# Patient Record
Sex: Female | Born: 1980 | Race: White | Hispanic: No | Smoking: Current every day smoker
Health system: Southern US, Community
[De-identification: ages and names within clinical notes are randomized; demographics above are authoritative.]

## PROBLEM LIST (undated history)

## (undated) DIAGNOSIS — I10 Essential (primary) hypertension: Secondary | ICD-10-CM

## (undated) DIAGNOSIS — F319 Bipolar disorder, unspecified: Secondary | ICD-10-CM

## (undated) HISTORY — PX: CHOLECYSTECTOMY: SHX55

---
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
## 2000-06-04 ENCOUNTER — Emergency Department (HOSPITAL_COMMUNITY): Admission: EM | Admit: 2000-06-04 | Discharge: 2000-06-04 | Payer: Self-pay

## 2000-06-28 ENCOUNTER — Emergency Department (HOSPITAL_COMMUNITY): Admission: EM | Admit: 2000-06-28 | Discharge: 2000-06-29 | Payer: Self-pay | Admitting: Emergency Medicine

## 2000-06-29 ENCOUNTER — Encounter: Payer: Self-pay | Admitting: Emergency Medicine

## 2000-07-10 ENCOUNTER — Encounter: Admission: RE | Admit: 2000-07-10 | Discharge: 2000-07-10 | Payer: Self-pay | Admitting: Sports Medicine

## 2000-08-01 ENCOUNTER — Encounter: Admission: RE | Admit: 2000-08-01 | Discharge: 2000-08-01 | Payer: Self-pay | Admitting: Family Medicine

## 2000-08-11 ENCOUNTER — Observation Stay (HOSPITAL_COMMUNITY): Admission: RE | Admit: 2000-08-11 | Discharge: 2000-08-12 | Payer: Self-pay

## 2000-10-23 ENCOUNTER — Emergency Department (HOSPITAL_COMMUNITY): Admission: EM | Admit: 2000-10-23 | Discharge: 2000-10-24 | Payer: Self-pay | Admitting: Emergency Medicine

## 2001-01-21 ENCOUNTER — Encounter: Admission: RE | Admit: 2001-01-21 | Discharge: 2001-01-21 | Payer: Self-pay | Admitting: Family Medicine

## 2001-04-17 ENCOUNTER — Encounter: Admission: RE | Admit: 2001-04-17 | Discharge: 2001-04-17 | Payer: Self-pay | Admitting: Family Medicine

## 2001-05-07 ENCOUNTER — Encounter: Admission: RE | Admit: 2001-05-07 | Discharge: 2001-05-07 | Payer: Self-pay | Admitting: Family Medicine

## 2002-09-25 ENCOUNTER — Emergency Department (HOSPITAL_COMMUNITY): Admission: EM | Admit: 2002-09-25 | Discharge: 2002-09-25 | Payer: Self-pay | Admitting: Emergency Medicine

## 2003-05-24 ENCOUNTER — Other Ambulatory Visit: Admission: RE | Admit: 2003-05-24 | Discharge: 2003-05-24 | Payer: Self-pay | Admitting: Family Medicine

## 2003-05-24 ENCOUNTER — Encounter: Admission: RE | Admit: 2003-05-24 | Discharge: 2003-05-24 | Payer: Self-pay | Admitting: Sports Medicine

## 2004-02-01 ENCOUNTER — Ambulatory Visit: Payer: Self-pay | Admitting: Family Medicine

## 2004-02-22 ENCOUNTER — Ambulatory Visit: Payer: Self-pay | Admitting: Sports Medicine

## 2004-03-20 ENCOUNTER — Ambulatory Visit: Payer: Self-pay | Admitting: Family Medicine

## 2004-03-26 ENCOUNTER — Ambulatory Visit: Payer: Self-pay | Admitting: Family Medicine

## 2004-04-04 ENCOUNTER — Ambulatory Visit: Payer: Self-pay | Admitting: Family Medicine

## 2004-04-17 ENCOUNTER — Ambulatory Visit: Payer: Self-pay | Admitting: Family Medicine

## 2004-05-03 ENCOUNTER — Emergency Department (HOSPITAL_COMMUNITY): Admission: EM | Admit: 2004-05-03 | Discharge: 2004-05-04 | Payer: Self-pay | Admitting: Emergency Medicine

## 2004-05-15 ENCOUNTER — Ambulatory Visit: Payer: Self-pay | Admitting: Family Medicine

## 2004-06-13 ENCOUNTER — Ambulatory Visit: Payer: Self-pay | Admitting: Sports Medicine

## 2004-11-12 ENCOUNTER — Ambulatory Visit: Payer: Self-pay | Admitting: Sports Medicine

## 2005-02-19 ENCOUNTER — Ambulatory Visit: Payer: Self-pay | Admitting: Family Medicine

## 2005-04-07 ENCOUNTER — Emergency Department (HOSPITAL_COMMUNITY): Admission: EM | Admit: 2005-04-07 | Discharge: 2005-04-07 | Payer: Self-pay | Admitting: Emergency Medicine

## 2005-10-28 ENCOUNTER — Emergency Department (HOSPITAL_COMMUNITY): Admission: EM | Admit: 2005-10-28 | Discharge: 2005-10-28 | Payer: Self-pay | Admitting: Emergency Medicine

## 2006-08-27 ENCOUNTER — Emergency Department (HOSPITAL_COMMUNITY): Admission: EM | Admit: 2006-08-27 | Discharge: 2006-08-27 | Payer: Self-pay | Admitting: Emergency Medicine

## 2007-02-28 ENCOUNTER — Emergency Department (HOSPITAL_COMMUNITY): Admission: EM | Admit: 2007-02-28 | Discharge: 2007-02-28 | Payer: Self-pay | Admitting: Emergency Medicine

## 2007-05-11 ENCOUNTER — Encounter: Payer: Self-pay | Admitting: Family Medicine

## 2007-05-11 ENCOUNTER — Ambulatory Visit: Payer: Self-pay | Admitting: Family Medicine

## 2007-05-11 DIAGNOSIS — F319 Bipolar disorder, unspecified: Secondary | ICD-10-CM

## 2007-05-11 DIAGNOSIS — N912 Amenorrhea, unspecified: Secondary | ICD-10-CM | POA: Insufficient documentation

## 2007-05-11 LAB — CONVERTED CEMR LAB: Beta hcg, urine, semiquantitative: NEGATIVE

## 2007-05-12 LAB — CONVERTED CEMR LAB
ALT: 35 units/L (ref 0–35)
AST: 22 units/L (ref 0–37)
Albumin: 4.1 g/dL (ref 3.5–5.2)
Alkaline Phosphatase: 84 units/L (ref 39–117)
BUN: 9 mg/dL (ref 6–23)
CO2: 23 meq/L (ref 19–32)
Calcium: 9.5 mg/dL (ref 8.4–10.5)
Chlamydia, DNA Probe: NEGATIVE
Chloride: 105 meq/L (ref 96–112)
Creatinine, Ser: 0.75 mg/dL (ref 0.40–1.20)
Direct LDL: 109 mg/dL — ABNORMAL HIGH
GC Probe Amp, Genital: NEGATIVE
Glucose, Bld: 94 mg/dL (ref 70–99)
Potassium: 4.1 meq/L (ref 3.5–5.3)
Sodium: 139 meq/L (ref 135–145)
Total Bilirubin: 0.8 mg/dL (ref 0.3–1.2)
Total Protein: 6.8 g/dL (ref 6.0–8.3)

## 2007-05-14 ENCOUNTER — Encounter: Payer: Self-pay | Admitting: Family Medicine

## 2007-05-14 LAB — CONVERTED CEMR LAB: Pap Smear: NORMAL

## 2007-07-29 ENCOUNTER — Emergency Department (HOSPITAL_COMMUNITY): Admission: EM | Admit: 2007-07-29 | Discharge: 2007-07-29 | Payer: Self-pay | Admitting: Emergency Medicine

## 2007-07-31 ENCOUNTER — Telehealth: Payer: Self-pay | Admitting: *Deleted

## 2009-01-14 ENCOUNTER — Encounter (INDEPENDENT_AMBULATORY_CARE_PROVIDER_SITE_OTHER): Payer: Self-pay | Admitting: *Deleted

## 2009-01-14 DIAGNOSIS — Z716 Tobacco abuse counseling: Secondary | ICD-10-CM | POA: Insufficient documentation

## 2009-05-03 ENCOUNTER — Emergency Department (HOSPITAL_COMMUNITY): Admission: EM | Admit: 2009-05-03 | Discharge: 2009-05-03 | Payer: Self-pay | Admitting: Emergency Medicine

## 2010-04-10 ENCOUNTER — Other Ambulatory Visit
Admission: RE | Admit: 2010-04-10 | Discharge: 2010-04-10 | Payer: Self-pay | Source: Home / Self Care | Admitting: Family Medicine

## 2010-04-10 ENCOUNTER — Ambulatory Visit: Payer: Self-pay | Admitting: Family Medicine

## 2010-04-10 DIAGNOSIS — L919 Hypertrophic disorder of the skin, unspecified: Secondary | ICD-10-CM

## 2010-04-10 DIAGNOSIS — M25519 Pain in unspecified shoulder: Secondary | ICD-10-CM | POA: Insufficient documentation

## 2010-04-10 DIAGNOSIS — L909 Atrophic disorder of skin, unspecified: Secondary | ICD-10-CM | POA: Insufficient documentation

## 2010-04-10 DIAGNOSIS — D239 Other benign neoplasm of skin, unspecified: Secondary | ICD-10-CM | POA: Insufficient documentation

## 2010-05-03 ENCOUNTER — Encounter: Payer: Self-pay | Admitting: Family Medicine

## 2010-05-03 ENCOUNTER — Ambulatory Visit: Admission: RE | Admit: 2010-05-03 | Discharge: 2010-05-03 | Payer: Self-pay | Source: Home / Self Care

## 2010-05-03 DIAGNOSIS — K219 Gastro-esophageal reflux disease without esophagitis: Secondary | ICD-10-CM | POA: Insufficient documentation

## 2010-05-03 DIAGNOSIS — J45909 Unspecified asthma, uncomplicated: Secondary | ICD-10-CM | POA: Insufficient documentation

## 2010-05-04 ENCOUNTER — Encounter: Payer: Self-pay | Admitting: Family Medicine

## 2010-05-07 ENCOUNTER — Telehealth: Payer: Self-pay | Admitting: Family Medicine

## 2010-05-16 ENCOUNTER — Ambulatory Visit: Admission: RE | Admit: 2010-05-16 | Discharge: 2010-05-16 | Payer: Self-pay | Source: Home / Self Care

## 2010-05-16 DIAGNOSIS — F1011 Alcohol abuse, in remission: Secondary | ICD-10-CM | POA: Insufficient documentation

## 2010-05-31 NOTE — Miscellaneous (Signed)
Summary: Procedure Consent  Procedure Consent   Imported By: Bradly Bienenstock 05/07/2010 17:13:57  _____________________________________________________________________  External Attachment:    Type:   Image     Comment:   External Document

## 2010-05-31 NOTE — Assessment & Plan Note (Signed)
Summary: physical/bmc   Vital Signs:  Patient profile:   30 year old female Height:      54 inches Weight:      185 pounds BMI:     44.77 Temp:     98.0 degrees F BP sitting:   130 / 80  (right arm) Cuff size:   regular  Vitals Entered By: Tessie Fass CMA (April 10, 2010 1:50 PM) CC: Meet the new doctor and pap smear.  Is Patient Diabetic? No Pain Assessment Patient in pain? yes     Location: right shoulder Intensity: 2   Primary Care Provider:  . [facility]  CC:  Meet the new doctor and pap smear. Marland Kitchen  History of Present Illness: Physical: Requests a Pap and meet the new doctor to discuss problems.   Moles/Skin Tags: Is concerned about several moles on her body. Notes a skin tag under her left axilla that is irritated by clothing. Also notes a skin tag in her right groin that is also irritated by clothing.  She describes a mole on her right breast that has not changed and a large mole on her back that her firends say have not changed. She would like the skin tages removed as they are bothering her.   Shoulder: Notes right shoulder pain with ROM. 6 months ago was trying to lift a stuck coffee cup from a ledge when it suddely came free. This jarred her arm and resulted in pain in her right shoulder. She will continue to experience intermintant pain in her right shoulder and deltoid with activity. Currently she is doing well, but was bad last week.   Ammonrhea: Has not had a period in 6 months. However this is not abnormal for Ms Borchardt.  She will have irregular cycles.  No abdominal pain. She is however having un protected sex with the same partner.   Bipolar disorder: Is not currently taking any medications for this issue. In the past she did well with lamictal. She has tried seroquel but did not like it. Additionally she has not tired lithium. She is not currently in a manic or hypomanic episode. She denies any SI/HI and does not express delusions or hallicunations.  Is  interested in restarting her bipolar medication.   Habits & Providers  Alcohol-Tobacco-Diet     Tobacco Status: current     Tobacco Counseling: to quit use of tobacco products     Cigarette Packs/Day: 1  Current Problems (verified): 1)  Nevus  (ICD-216.9) 2)  Skin Tag  (ICD-701.9) 3)  Shoulder Pain, Right, Chronic  (ICD-719.41) 4)  Tobacco User  (ICD-305.1) 5)  Sexually Transmitted Disease, Exposure To  (ICD-V01.6) 6)  Screening For Malignant Neoplasm of The Cervix  (ICD-V76.2) 7)  Bipolar Affective Disorder  (ICD-296.80) 8)  Amenorrhea  (ICD-626.0)  Current Medications (verified): 1)  None  Allergies (verified): No Known Drug Allergies  Past History:  Past Medical History: h/o cholelithiasis Bipolar Disorder Anxiety/Panic episodes Insomnia Skin tags Moles   Review of Systems  The patient denies anorexia, fever, weight loss, chest pain, syncope, dyspnea on exertion, abdominal pain, severe indigestion/heartburn, difficulty walking, depression, unusual weight change, and enlarged lymph nodes.    Physical Exam  General:  Vs noted.  Well obese NAD Head:  normocephalic and atraumatic.   Ears:  no external deformities.   Nose:  no external deformity.   Mouth:  Oral mucosa and oropharynx without lesions or exudates.   Lungs:  Normal respiratory effort, chest expands symmetrically.  Lungs are clear to auscultation, no crackles or wheezes. Heart:  Normal rate and regular rhythm. S1 and S2 normal without gallop, murmur, click, rub or other extra sounds. Abdomen:  Bowel sounds positive,abdomen soft and non-tender without masses, organomegaly or hernias noted. Genitalia:  Normal introitus for age, no external lesions, no vaginal discharge, mucosa pink and moist, no vaginal or cervical lesions, no vaginal atrophy, no friability or hemorrhage, normal uterus size and position, no adnexal masses or tenderness.  Pap  sent. Msk:  Right shoulder.  Normal appearing no skin changes or  swelling.  ROM is normal. Strength is 5/5 with exception of isolated superspinateus which is 4+/5 and produces pain. (Empty can pos). Neers and Hawkins negative.   Extremities:  No clubbing, cyanosis, edema, or deformity noted. Skin:  5mm irreg border dark flat single color nevus on upper left chest. Not changing.  6x8 slightly raised nevus on the middle of the back.   Multiple skin tags in the left axilla some irritated.  Again small skin tags in the groin 1 irritated in the right inguinal crease.  Cervical Nodes:  No lymphadenopathy noted Axillary Nodes:  No palpable lymphadenopathy Psych:  Cognition and judgment appear intact. Alert and cooperative with normal attention span and concentration. No apparent delusions, illusions, hallucinations Additional Exam:  3 skin tags frozen. 2 in left axilla and 1 in the right inguinal crease   Impression & Recommendations:  Problem # 1:  SKIN TAG (ICD-701.9) Assessment New  3 skin tags are irritated and were treated with cryotherapy for 20 seconds of freeze time.  Will follow up at next visit.   Orders: [facility]- Est  Level 4 (99214) Cryo (1st lesion) benign - [facility] (17000) Cryo (2nd - 14th) - [facility] (17003)  Problem # 2:  NEVUS (ICD-216.9) Assessment: New  2 nevus of concern today.  1) 5mm irreg border nevus on left upper chest. Pt states that it has not changed. Will follow over the course of the year. If it is changing will remove.  2) 6x66mm nevus on the back. Plan to remove as it is large and patient cannot obsever it for change. Will remove at the next visit with an excisonal biopsy with 2-3 mm borders.   Orders: [facility]- Est  Level 4 (99214)  Problem # 3:  SHOULDER PAIN, RIGHT, CHRONIC (ICD-719.41) Assessment: New  Think this is a partial rotator cuff tear / injury. Conservative management. Will review PT exercises at the next visit.  NSAIDs for pain.  Orders: [facility]- Est  Level 4 (99214)  Problem # 4:  BIPOLAR AFFECTIVE DISORDER  (ICD-296.80) Assessment: Deteriorated  Would like to restart medications. Will review paper chart to see what worked well in the past. Mas refer to mood disorder clinic.  No SI/HI today.  Orders: [facility]- Est  Level 4 (99214)  Problem # 5:  SCREENING FOR MALIGNANT NEOPLASM OF THE CERVIX (ICD-V76.2) Papsmear ran today. Orders: Pap Smear-[facility] ([id number]) Pap Smear- [facility] (Pap)  Other Orders: U Preg-[facility] (57846)  Patient Instructions: 1)  Thank you for seeing me today. 2)  Come back in 2-4 weeks to talk about bipolar disorder and to do a complete skin exam and mole removal.  3)  Try to remember all the bipolar medicines you have been on.  4)  I will let you know about the paps at the next visit.    Orders Added: 1)  U Preg-[facility] [81025] 2)  Pap Smear-[facility] [[id number]] 3)  [facility]- Est  Level 4 [99214] 4)  Pap Smear- [facility] [Pap] 5)  Cryo (1st lesion) benign - [facility] [17000] 6)  Cryo (2nd - 14th) - [facility] [17003]    Laboratory Results   Urine Tests  Date/Time Received: April 10, 2010 2:24 PM  Date/Time Reported: April 10, 2010 2:36 PM     Urine HCG: negative Comments: ...............test performed by......Marland KitchenBonnie A. [place], MLS (ASCP)cm

## 2010-05-31 NOTE — Assessment & Plan Note (Signed)
Summary: F/U  KH   Vital Signs:  Patient profile:   30 year old female Height:      54 inches Weight:      186 pounds BMI:     45.01 Temp:     98.3 degrees F oral Pulse rate:   98 / minute BP sitting:   126 / 89  (left arm) Cuff size:   regular  Vitals Entered By: Tessie Fass CMA (May 03, 2010 2:44 PM) CC: F/U Mole, Bipolar, and new asthma and heartburn   Primary Care Provider:  . [facility]  CC:  F/U Mole, Bipolar, and and new asthma and heartburn.  History of Present Illness: 1) Mole: Here for removal of a nevus with worysem features. Ok with procedure.   2) Bipolar: Here to follow up her bipolar disorder. Would like to initiate treatment as she feels like she is not quite herself and would like to resume. Mood Dosorder questionair today is scanned. It is postive for the past month. She is agreeable to starting treatment today and following up with the mood disorder clinic on Jan 18th at 930am.   3) Breathing: Notes that she is haveing more asthma attacks recently. She is having to use her albuterol HFA (that she got at the ED) up to every 3 days. She has never been diagnosed however she thinks that she does have asthma.   4) GERD: Has burning and gnawing pain in her abdomen and sternum with and between meals. She has used tums before whcih helped some. She is interested in starting treatment today.   Habits & Providers  Alcohol-Tobacco-Diet     Tobacco Status: current     Tobacco Counseling: to quit use of tobacco products     Cigarette Packs/Day: 1  Current Problems (verified): 1)  Gerd  (ICD-530.81) 2)  Asthma, Persistent, Mild  (ICD-493.90) 3)  Nevus  (ICD-216.9) 4)  Skin Tag  (ICD-701.9) 5)  Shoulder Pain, Right, Chronic  (ICD-719.41) 6)  Tobacco User  (ICD-305.1) 7)  Sexually Transmitted Disease, Exposure To  (ICD-V01.6) 8)  Screening For Malignant Neoplasm of The Cervix  (ICD-V76.2) 9)  Bipolar Affective Disorder  (ICD-296.80) 10)  Amenorrhea   (ICD-626.0)  Current Medications (verified): 1)  Lamotrigine 25 Mg Tabs (Lamotrigine) .Marland Kitchen.. 1 By Mouth Daily 2)  Proventil Hfa 108 (90 Base) Mcg/act Aers (Albuterol Sulfate) .... 2 Puffs Inh Q4 Hrs As Needed Wheeze 3)  Qvar 40 Mcg/act Aers (Beclomethasone Dipropionate) .... 2 Puffs Inhailled Twice A Day To Prevent Asthma 4)  Famotidine 20 Mg Tabs (Famotidine) .Marland Kitchen.. 1 By Mouth Bid  Allergies (verified): No Known Drug Allergies  Past History:  Social History: Last updated: 05/11/2007 Smokes 1 ppd x 10 years.  ETOH use twice per week, social.  H/o MJ use in past - denies currently.  Lives with 44 year old daughter.  Has a roommate in addition to husband in household.  Roommate is a longtime family friend.  Past Medical History: h/o cholelithiasis Bipolar Disorder Anxiety/Panic episodes Insomnia Skin tags Moles  GERD Asthma? 04/2010  Past Surgical History: Shave biopsy nevus on back 04/2010  Family History: Brother and perhaps mother with bipolar disorder.   Review of Systems       The patient complains of prolonged cough.  The patient denies anorexia, fever, weight loss, chest pain, syncope, dyspnea on exertion, abdominal pain, hematochezia, severe indigestion/heartburn, muscle weakness, suspicious skin lesions, unusual weight change, and enlarged lymph nodes.    Physical Exam  General:  Vs noted.  Some wheezing but otherwise well NAD Head:  normocephalic and atraumatic.   Eyes:  vision grossly intact, pupils equal, pupils round, and pupils reactive to light.   Ears:  no external deformities.   Nose:  no external deformity.   Mouth:  Oral mucosa and oropharynx without lesions or exudates.   Lungs:  Normal respiratory effort, chest expands symmetrically. Failt exp wheezing noted BL. Otherwise normal.  Heart:  Normal rate and regular rhythm. S1 and S2 normal without gallop, murmur, click, rub or other extra sounds. Abdomen:  Bowel sounds positive,abdomen soft and non-tender  without masses, organomegaly or hernias noted. Extremities:  No clubbing, cyanosis, edema, or deformity noted. Skin:  6x15mm slightly raised nevus on the middle of the back brown in color.  Healing erythemetus under right arm at site of cryotherapy.   Cervical Nodes:  No lymphadenopathy noted Axillary Nodes:  No palpable lymphadenopathy Psych:  Cognition and judgment appear intact. Alert and cooperative with normal attention span and concentration. No apparent delusions, illusions, hallucinations Additional Exam:  Procedure note: Shave biopsy. Concent obtained time out performed.  Nevus on back cleaned with alcohol and then intected with around .5ml of lidocaine with epinepherine. The area was then removed with a shallow shave biopsy and then silver nitrate was applied. No bleeding. Are covered with betadine ointment and covered with a bandage. Pt tolerated the procedure well.    Impression & Recommendations:  Problem # 1:  NEVUS (ICD-216.9) Assessment Improved  Removed nevus on back as it was large and in an area that she could not examin.  Plan to follow up pathology results. if dysplastic will consider derm referal or wider excision.   Orders: [facility]- Est  Level 4 (91478) Provider Misc Charge- [facility] (Misc)  Problem # 2:  BIPOLAR AFFECTIVE DISORDER (ICD-296.80) Assessment: Unchanged  Stable currently but would benifit from treatment.  Will start low dose lamictal and refer to mood disorder clinic. Discussed rashes and this medication. Plan to follow up in 1 month.   Orders: [facility]- Est  Level 4 (29562)  Problem # 3:  ASTHMA, PERSISTENT, MILD (ICD-493.90) Assessment: New  New diagnosis of asthma. She had been going to the ED for this issue in the past. As she is using albuterol quite a bit I will start QVAR and follow. Disussed use of spacer with pharmacy student.  Will follow up in 2 weeks. Encouraged smoking cessation.  Her updated medication list for this problem includes:     Proventil Hfa 108 (90 Base) Mcg/act Aers (Albuterol sulfate) .Marland Kitchen... 2 puffs inh q4 hrs as needed wheeze    Qvar 40 Mcg/act Aers (Beclomethasone dipropionate) .Marland Kitchen... 2 puffs inhailled twice a day to prevent asthma  Orders: [facility]- Est  Level 4 (13086)  Problem # 4:  GERD (ICD-530.81) Assessment: New  New diagnosis today based on symptoms. Will conservtivly treat with H2 blocker for 1 month. if working will consider switching to omeprazole.  Her updated medication list for this problem includes:    Famotidine 20 Mg Tabs (Famotidine) .Marland Kitchen... 1 by mouth bid  Orders: [facility]- Est  Level 4 (57846)  Complete Medication List: 1)  Lamotrigine 25 Mg Tabs (Lamotrigine) .Marland Kitchen.. 1 by mouth daily 2)  Proventil Hfa 108 (90 Base) Mcg/act Aers (Albuterol sulfate) .... 2 puffs inh q4 hrs as needed wheeze 3)  Qvar 40 Mcg/act Aers (Beclomethasone dipropionate) .... 2 puffs inhailled twice a day to prevent asthma 4)  Famotidine 20 Mg Tabs (Famotidine) .Marland KitchenMarland KitchenMarland Kitchen 1  by mouth bid  Patient Instructions: 1)  Thank you for seeing me today. 2)  Come back in 1 month.  3)  Please see the mood disorder clinic on Jan 18th at 930.  4)  Start taking lamictal. Let me know about rashes on this medication.  5)  Start taking pepdic (famotidine) twice a day for heart burn. 6)  Use the qvar twice a day for breathing.  7)  I will call you about the biopsy results.  I will leave a message.  Prescriptions: FAMOTIDINE 20 MG TABS (FAMOTIDINE) 1 by mouth bid  #60 x 6   Entered and Authorized by:   Clementeen Graham MD   Signed by:   Clementeen Graham MD on 05/03/2010   Method used:   Electronically to        [facility]  [address] [phone]* (retail)       [address]       [place], [place]  [address]       Ph: [phone] or [phone]       Fax: [phone]   RxID:   [id number] QVAR 40 MCG/ACT AERS (BECLOMETHASONE DIPROPIONATE) 2 puffs inhailled twice a day to prevent asthma  #1 x 5   Entered and Authorized by:   Clementeen Graham  MD   Signed by:   Clementeen Graham MD on 05/03/2010   Method used:   [facility]ronically [facility]        [facility] [address] [facility] [address] [phone]* (retail)       [address]       [place], [place]  [address]       Ph: [phone] or [phone]       Fax: [phone]   RxID:   [id number] PROVENTIL HFA 108 (90 BASE) MCG/ACT AERS (ALBUTEROL SULFATE) 2 puffs inh q4 hrs as needed wheeze  #1 x 1   Entered and Authorized by:   Clementeen Graham MD   Signed by:   Clementeen Graham MD on 05/03/2010   Method used:   [facility]        [facility] [address] [phone]* (retail)       [address]       [place], [place]  [address]       Ph: [phone] or [phone]       Fax: [phone]   RxID:   [id number] LAMOTRIGINE 25 MG TABS (LAMOTRIGINE) 1 by mouth daily  #30 x 0   Entered and Authorized by:   Clementeen Graham MD   Signed by:   Clementeen Graham MD on 05/03/2010   Method used:   [facility]ronically [facility]        [facility] [address] [facility] [address] [phone]* (retail)       [address]       [place], [place]  [address]       Ph: [phone] or [phone]       Fax: [phone]   RxID:   [id number]    Orders Added: 1)  [facility]- Est  Level 4 [99833] 2)  Provider Misc Charge- [facility] [Misc]

## 2010-05-31 NOTE — Progress Notes (Signed)
  Phone Note Outgoing Call   Call placed by: Clementeen Graham MD,  May 07, 2010 2:06 PM Summary of Call: called and left a message about the skin biopsy results.  Initial call taken by: Clementeen Graham MD,  May 07, 2010 2:06 PM

## 2010-05-31 NOTE — Assessment & Plan Note (Signed)
Summary: [facility] follow-up   Primary Care Provider:  . [facility]   History of Present Illness: Stacey Wong presents at the request of her PCP.  She saw him recently and was restarted on Lamictal, 25 mg.  She reports today feeling "under pressure" being here.  She has trouble articulating her thoughts and feelings.  She mentions being "crazy" and a "lunatic" when angry.  She reports racing thoughts, loss of control and impulsive behavior (although she can't specify what behaviors).  She describes periods of being "on top of the world" where she is happy and is followed by "crashing and burning" where she would rather just die.    She reports getting divorced since we last saw her (several years ago).  She has one daughter and lives with her mother.  Alcohol - drinks vodka and juice about 3 or 4 fifths a week.  Grows tearful around this issue.  Does report blackouts.  First use was around 15 or 16.  DWI at 19 and one around 24.  Has not had a period of sobriety since initiating drinking.  Did not assess where alcoholism runs in the family.  Uses marijuana about 3-4 times a month.    Mental health issues run in the family in mother and brother but she does not know what specific issues.  Was admitted to [facility] for suicidal ideation reported to her mother around the time of her last DWI.  Stayed three days.  No reported attempts.  Allergies: No Known Drug Allergies   Impression & Recommendations:  Problem # 1:  BIPOLAR AFFECTIVE DISORDER (ICD-296.80) Really hard ton elicit history.  Vague initially and when pressed she seems to get overwhelmed.  She also appears angry on occasion.  She did report feeling "observed" and did not like the fact there were three people in the room helping with the assessment and treatment.  She reported suicidal ideation with no specific plan.  Daughter is her barrier.  This remains a risk.  She denied previous attempts.    This is the first time we have heard of this  degree of alcohol use (as far as we can remember - paper chart is not available for review).  According to [facility], Dr. Denyse Amass is not aware of this degree of alcohol use.  Of note - has not yet gotten license back since DUI five years ago.  Although bipolar diagnosis can not be fully confirmed given the degree of substance use over the past 10 years, we would strongly suspect a dual diagnosis. Patient describes what seems to be both manic and depressive episodes but it is unclear what degree alcohol use is playing a role.  Treatment team determined that alcohol use was primary.  She appeared to potentially be affected by recent alcohol use during her appointment today (bloodshot eyes, red face and reports of it being "too early" for her).  Patient appeared frustrated and questioned our ability to "help."  We view the feedback about the impact her alcohol use has on her life and mood issues as being helpful and yet recognize it is not a message she is not likely ready to hear.  Validated that and attempted to provide support.  Also provided options.  See patient instructions for further detail.      Orders: Therapy 40-50- min- [facility] (78295)  Problem # 2:  ALCOHOL ABUSE (ICD-305.00)  See above and patient instructions.  Offered individual counseling to further discuss the alcohol issue. She reported she was interested.  Provided phone number for her to call for an appt.  Orders: Therapy 40-50- min- [facility] (16109)  Complete Medication List: 1)  Lamotrigine 25 Mg Tabs (Lamotrigine) .Marland Kitchen.. 1 by mouth daily 2)  Proventil Hfa 108 (90 Base) Mcg/act Aers (Albuterol sulfate) .... 2 puffs inh q4 hrs as needed wheeze 3)  Qvar 40 Mcg/act Aers (Beclomethasone dipropionate) .... 2 puffs inhailled twice a day to prevent asthma 4)  Famotidine 20 Mg Tabs (Famotidine) .Marland Kitchen.. 1 by mouth bid  Patient Instructions: 1)  Based on your report today about how long you have been drinking and how much you are drinking, getting a  substance abuse evaluation and treatment is necessary in order to help you further.  [facility] does assessments.  You can call them at [phone].  AA is also an option and you can find meetings by googling alcoholics anonymous. 2)  You can stop the Lamictal if you are not ready to get your alcohol abuse addressed because the medicine is not going to be helpful. 3)  Please call [phone] if you would like to schedule an appointment to speak with me (Dr. Pascal Lux).  We can talk further about where to go from here.   Orders Added: 1)  Therapy 40-50- min- [facility] [90806]

## 2010-05-31 NOTE — Letter (Signed)
Summary: Mood Disorder questionnaire  Mood Disorder questionnaire   Imported By: De Nurse 05/09/2010 14:34:36  _____________________________________________________________________  External Attachment:    Type:   Image     Comment:   External Document

## 2010-06-13 ENCOUNTER — Emergency Department (HOSPITAL_COMMUNITY)
Admission: EM | Admit: 2010-06-13 | Discharge: 2010-06-13 | Disposition: A | Discharge status: Home / Self Care | Payer: Medicaid Other | Attending: Emergency Medicine | Admitting: Emergency Medicine

## 2010-06-13 DIAGNOSIS — N760 Acute vaginitis: Secondary | ICD-10-CM | POA: Insufficient documentation

## 2010-06-13 DIAGNOSIS — N949 Unspecified condition associated with female genital organs and menstrual cycle: Secondary | ICD-10-CM | POA: Insufficient documentation

## 2010-06-13 DIAGNOSIS — B9689 Other specified bacterial agents as the cause of diseases classified elsewhere: Secondary | ICD-10-CM | POA: Insufficient documentation

## 2010-06-13 DIAGNOSIS — A499 Bacterial infection, unspecified: Secondary | ICD-10-CM | POA: Insufficient documentation

## 2010-06-13 LAB — URINALYSIS, ROUTINE W REFLEX MICROSCOPIC
Nitrite: NEGATIVE
Specific Gravity, Urine: 1.02 (ref 1.005–1.030)
pH: 7.5 (ref 5.0–8.0)

## 2010-06-13 LAB — URINE MICROSCOPIC-ADD ON

## 2010-06-13 LAB — POCT PREGNANCY, URINE: Preg Test, Ur: NEGATIVE

## 2010-06-13 LAB — GC/CHLAMYDIA PROBE AMP, GENITAL: Chlamydia, DNA Probe: NEGATIVE

## 2010-06-29 ENCOUNTER — Emergency Department (HOSPITAL_COMMUNITY): Payer: Medicaid Other

## 2010-06-29 ENCOUNTER — Emergency Department (HOSPITAL_COMMUNITY)
Admission: EM | Admit: 2010-06-29 | Discharge: 2010-06-29 | Disposition: A | Discharge status: Home / Self Care | Payer: Medicaid Other | Attending: Emergency Medicine | Admitting: Emergency Medicine

## 2010-06-29 DIAGNOSIS — F17200 Nicotine dependence, unspecified, uncomplicated: Secondary | ICD-10-CM | POA: Insufficient documentation

## 2010-06-29 DIAGNOSIS — M25519 Pain in unspecified shoulder: Secondary | ICD-10-CM | POA: Insufficient documentation

## 2010-06-29 DIAGNOSIS — S4350XA Sprain of unspecified acromioclavicular joint, initial encounter: Secondary | ICD-10-CM | POA: Insufficient documentation

## 2010-06-29 DIAGNOSIS — W010XXA Fall on same level from slipping, tripping and stumbling without subsequent striking against object, initial encounter: Secondary | ICD-10-CM | POA: Insufficient documentation

## 2010-06-29 DIAGNOSIS — Y929 Unspecified place or not applicable: Secondary | ICD-10-CM | POA: Insufficient documentation

## 2010-07-02 ENCOUNTER — Encounter: Payer: Self-pay | Admitting: Family Medicine

## 2010-07-02 ENCOUNTER — Ambulatory Visit (INDEPENDENT_AMBULATORY_CARE_PROVIDER_SITE_OTHER): Payer: Medicaid Other | Admitting: Family Medicine

## 2010-07-02 VITALS — BP 147/96 | HR 84 | Temp 97.6°F | Ht 64.0 in | Wt 186.0 lb

## 2010-07-02 DIAGNOSIS — M25512 Pain in left shoulder: Secondary | ICD-10-CM

## 2010-07-02 DIAGNOSIS — M25519 Pain in unspecified shoulder: Secondary | ICD-10-CM

## 2010-07-02 NOTE — Assessment & Plan Note (Signed)
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  Reviewed xray with Dr Ranee Gosselin.  Will refer to Dr Shon Baton (orthopedics), has appt tomorrow 1:30.  Will wait for evaluation by Dr Shon Baton to determine if surgery is necessary.  Discussed with pt that if surgery is not necessary then she may have 3-4 weeks of sling wearing and some pain.

## 2010-07-02 NOTE — Progress Notes (Signed)
  Subjective:    Patient ID: Stacey Wong, female    DOB: 20-Sep-1980, 30 y.o.   MRN: [id number]  HPI Pt fell 06/29/10 at friend's house.  She thinks she may have tripped over her friend's dog.  Alcohol was involved.  Pt was seen at [facility] ED for Left shoulder pain on 06/29/10 with xray that showed:  1.0 cm elevation of the distal left clavicle with respect to the acromion raises concern for [place]wood type 3 acromioclavicular joint  separation.  No evidence of fracture.  She was given a sling and referred to Dr Venita Lick (orthopedics) for f/u.  Pt states that when she called there for appt she was told she had to have a referral from here first.   Pt is having a lot of pain in L shoulder.  She was given #20 tabs of Percocet from ED and has only one tablet left.  She would like pain medications today.  She is not able to move her left arm much, she is wearing her sling as instructed.     Review of Systems No fever, chills, cough, chest pain, nausea, vomiting.     Objective:   Physical Exam GEN: nad, appropriate throughout exam HEENT: [place]/at [facility]: Left arm in sling.  Left shoulder with echymoses (purple/red in color), +tenderness to palpation, + some swelling. ROM not accessed as pt in in pain. Left hand with +2 radial pulses and fingers with mobility.         Assessment & Plan:

## 2010-07-15 LAB — RAPID STREP SCREEN (MED CTR MEBANE ONLY): Streptococcus, Group A Screen (Direct): NEGATIVE

## 2010-07-15 LAB — POCT I-STAT, CHEM 8
HCT: 44 % (ref 36.0–46.0)
Hemoglobin: 15 g/dL (ref 12.0–15.0)
Sodium: 140 mEq/L (ref 135–145)
TCO2: 29 mmol/L (ref 0–100)

## 2010-08-29 ENCOUNTER — Telehealth: Payer: Self-pay | Admitting: Psychology

## 2010-08-29 NOTE — Telephone Encounter (Signed)
Stacey Wong called to schedule an appt.  We last saw her in [facility] on January 18th.  Had to find note in [employer].  She was uncomfortable with "being observed" with three people in the room.  Identified alcohol abuse as being the primary issue during that visit.  She left angry.  She is calling now stating she has quit drinking and wants help.  Still has trouble with the thought of three people in the room.  Elected to schedule in [facility] with just me to explore issues.  She understands I do not prescribe medicine.  Appt scheduled for May 8th.

## 2010-09-04 ENCOUNTER — Ambulatory Visit: Payer: Medicaid Other | Admitting: Psychology

## 2010-09-12 ENCOUNTER — Ambulatory Visit (INDEPENDENT_AMBULATORY_CARE_PROVIDER_SITE_OTHER): Payer: Medicaid Other | Admitting: Psychology

## 2010-09-12 DIAGNOSIS — F1010 Alcohol abuse, uncomplicated: Secondary | ICD-10-CM

## 2010-09-12 DIAGNOSIS — F410 Panic disorder [episodic paroxysmal anxiety] without agoraphobia: Secondary | ICD-10-CM

## 2010-09-13 DIAGNOSIS — F410 Panic disorder [episodic paroxysmal anxiety] without agoraphobia: Secondary | ICD-10-CM | POA: Insufficient documentation
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
## 2010-09-13 NOTE — Assessment & Plan Note (Signed)
Did not assess today.  Last phone contact she said she was no longer drinking alcohol.

## 2010-09-13 NOTE — Assessment & Plan Note (Signed)
Although did not reassess substance use today, suspect that Panic Disorder is a long standing diagnosis for her.  She meets criteria for the disorder in that she has anticipatory anxiety and untriggered attacks.  At session end, she wondered what I could do about her anxiety - specifically from a medication standpoint.  I would think that treatment with a benzodiazepine would be undesirable with her substance abuse history.  Will need to explore other options.  I suspect Stacey Wong meets criteria for PTSD as well as panic.  Treatment for this is largely exposure and CBT.  Discussed this briefly today.  She says she has not talked about her traumas very much.  Will need to develop some coping mechanisms in order to do this work.  I am not sure how bought in she is to therapy.  It sounds like meeting with me is the first step to getting into [facility].  Will revisit this.  In addition to PTSD and Panic, she carries a diagnosis of Bipolar Disorder.  I am not sure if this was diagnosed in the absence of daily substance use.  Will review notes.  The differential would include axis II pathology as well - Cluster B.  Agreed to meet again on May 29th at 3:00.  Will get a better sense of her thoughts on treatment approaches and will review current substance use.

## 2010-09-25 ENCOUNTER — Ambulatory Visit: Payer: Medicaid Other | Admitting: Psychology

## 2010-12-16 ENCOUNTER — Emergency Department (HOSPITAL_COMMUNITY)
Admission: EM | Admit: 2010-12-16 | Discharge: 2010-12-16 | Disposition: A | Discharge status: Home / Self Care | Payer: Medicaid Other | Attending: Emergency Medicine | Admitting: Emergency Medicine

## 2010-12-16 DIAGNOSIS — M25519 Pain in unspecified shoulder: Secondary | ICD-10-CM | POA: Insufficient documentation

## 2010-12-16 DIAGNOSIS — F17200 Nicotine dependence, unspecified, uncomplicated: Secondary | ICD-10-CM | POA: Insufficient documentation

## 2010-12-16 DIAGNOSIS — IMO0002 Reserved for concepts with insufficient information to code with codable children: Secondary | ICD-10-CM | POA: Insufficient documentation

## 2010-12-16 DIAGNOSIS — X500XXA Overexertion from strenuous movement or load, initial encounter: Secondary | ICD-10-CM | POA: Insufficient documentation

## 2011-03-31 ENCOUNTER — Emergency Department (HOSPITAL_COMMUNITY)
Admission: EM | Admit: 2011-03-31 | Discharge: 2011-03-31 | Disposition: A | Discharge status: Home / Self Care | Payer: Medicaid Other | Attending: Emergency Medicine | Admitting: Emergency Medicine

## 2011-03-31 ENCOUNTER — Encounter (HOSPITAL_COMMUNITY): Payer: Self-pay | Admitting: Nurse Practitioner

## 2011-03-31 DIAGNOSIS — R350 Frequency of micturition: Secondary | ICD-10-CM | POA: Insufficient documentation

## 2011-03-31 DIAGNOSIS — S335XXA Sprain of ligaments of lumbar spine, initial encounter: Secondary | ICD-10-CM | POA: Insufficient documentation

## 2011-03-31 DIAGNOSIS — T148XXA Other injury of unspecified body region, initial encounter: Secondary | ICD-10-CM

## 2011-03-31 DIAGNOSIS — R1032 Left lower quadrant pain: Secondary | ICD-10-CM | POA: Insufficient documentation

## 2011-03-31 DIAGNOSIS — M545 Low back pain, unspecified: Secondary | ICD-10-CM | POA: Insufficient documentation

## 2011-03-31 DIAGNOSIS — J45909 Unspecified asthma, uncomplicated: Secondary | ICD-10-CM | POA: Insufficient documentation

## 2011-03-31 DIAGNOSIS — X500XXA Overexertion from strenuous movement or load, initial encounter: Secondary | ICD-10-CM | POA: Insufficient documentation

## 2011-03-31 DIAGNOSIS — M549 Dorsalgia, unspecified: Secondary | ICD-10-CM

## 2011-03-31 LAB — POCT I-STAT, CHEM 8
BUN: <3 mg/dL — ABNORMAL LOW (ref 6–23)
Calcium, Ion: 1.12 mmol/L (ref 1.12–1.32)
HCT: 41 % (ref 36.0–46.0)
TCO2: 24 mmol/L (ref 0–100)

## 2011-03-31 LAB — URINALYSIS, ROUTINE W REFLEX MICROSCOPIC
Ketones, ur: NEGATIVE mg/dL
Leukocytes, UA: NEGATIVE
Nitrite: NEGATIVE
Protein, ur: NEGATIVE mg/dL
Urobilinogen, UA: 1 mg/dL (ref 0.0–1.0)

## 2011-03-31 LAB — POCT PREGNANCY, URINE: Preg Test, Ur: NEGATIVE

## 2011-03-31 MED ORDER — ONDANSETRON HCL 4 MG/2ML IJ SOLN
4.0000 mg | Freq: Once | INTRAMUSCULAR | Status: AC
  Administered 2011-03-31: 4 mg via INTRAVENOUS
  Filled 2011-03-31: qty 2

## 2011-03-31 MED ORDER — PREDNISONE 20 MG PO TABS: 40.0000 mg | ORAL_TABLET | Freq: Every day | ORAL | Status: AC

## 2011-03-31 MED ORDER — HYDROMORPHONE HCL PF 1 MG/ML IJ SOLN
1.0000 mg | Freq: Once | INTRAMUSCULAR | Status: AC
  Administered 2011-03-31: 1 mg via INTRAVENOUS
  Filled 2011-03-31: qty 1

## 2011-03-31 MED ORDER — CYCLOBENZAPRINE HCL 10 MG PO TABS: 10.0000 mg | ORAL_TABLET | Freq: Two times a day (BID) | ORAL | Status: DC | PRN

## 2011-03-31 MED ORDER — OXYCODONE-ACETAMINOPHEN 5-325 MG PO TABS: 1.0000 | ORAL_TABLET | Freq: Four times a day (QID) | ORAL | Status: DC | PRN

## 2011-03-31 NOTE — ED Provider Notes (Signed)
History     CSN: [id number] Arrival date & time: 03/31/2011 11:49 AM   First MD Initiated Contact with Patient 03/31/11 1316      Chief Complaint  Patient presents with  . Back Pain    (Consider location/radiation/quality/duration/timing/severity/associated sxs/prior treatment) Patient is a 30 y.o. female presenting with back pain. The history is provided by the patient.  Back Pain  This is a new problem. The current episode started more than 2 days ago. The problem occurs constantly. The problem has not changed since onset.Associated with: Had been tapered to pick something up when she stood up started developing the pain. The pain is present in the lumbar spine (Left flank). The quality of the pain is described as stabbing and shooting. Radiates to: Left groin. The pain is at a severity of 8/10. The pain is severe. The symptoms are aggravated by certain positions and twisting (Urinating). The pain is the same all the time. Stiffness is present all day. Associated symptoms include abdominal pain. Pertinent negatives include no chest pain, no fever, no bowel incontinence, no bladder incontinence, no dysuria, no leg pain, no paresthesias, no paresis and no weakness. Associated symptoms comments: States urine has looked dark including urinating more frequently. Treatments tried: Opiates. The treatment provided mild relief.    Past Medical History  Diagnosis Date  . Asthma     Past Surgical History  Procedure Date  . Cholecystectomy     History reviewed. No pertinent family history.  History  Substance Use Topics  . Smoking status: Current Everyday Smoker -- 0.5 packs/day    Types: Cigarettes  . Smokeless tobacco: Not on file  . Alcohol Use: Yes     rare    OB History    Grav Para Term Preterm Abortions TAB SAB Ect Mult Living                  Review of Systems  Constitutional: Negative for fever.  Cardiovascular: Negative for chest pain.  Gastrointestinal: Positive for  abdominal pain. Negative for bowel incontinence.  Genitourinary: Negative for bladder incontinence and dysuria.  Musculoskeletal: Positive for back pain.  Neurological: Negative for weakness and paresthesias.  All other systems reviewed and are negative.    Allergies  Review of patient's allergies indicates no known allergies.  Home Medications   Current Outpatient Rx  Name Route Sig Dispense Refill  . ALBUTEROL SULFATE HFA 108 (90 BASE) MCG/ACT IN AERS Inhalation Inhale 2 puffs into the lungs every 6 (six) hours as needed. For shortness of breath.     Marland Kitchen HYDROCODONE-ACETAMINOPHEN 5-325 MG PO TABS Oral Take 1 tablet by mouth every 8 (eight) hours as needed. For pain.     . IBUPROFEN 800 MG PO TABS Oral Take 800 mg by mouth every 8 (eight) hours as needed. For pain.       BP 143/86  Pulse 84  Temp(Src) 98.1 F (36.7 C) (Oral)  Resp 20  Ht 5\' 4"  (1.626 m)  Wt 180 lb (81.647 kg)  BMI 30.90 kg/m2  SpO2 100%  Physical Exam  Nursing note and vitals reviewed. Constitutional: She is oriented to person, place, and time. She appears well-developed and well-nourished. She appears distressed.  HENT:  Head: Normocephalic and atraumatic.  Eyes: EOM are normal. Pupils are equal, round, and reactive to light.  Cardiovascular: Normal rate, regular rhythm, normal heart sounds and intact distal pulses.  Exam reveals no friction rub.   No murmur heard. Pulmonary/Chest: Effort normal and breath sounds  normal. She has no wheezes. She has no rales.  Abdominal: Soft. Bowel sounds are normal. She exhibits no distension. There is tenderness in the left lower quadrant. There is CVA tenderness. There is no rebound and no guarding.       Tenderness in the left groin is very mild  Musculoskeletal: Normal range of motion. She exhibits no tenderness.       No edema  Neurological: She is alert and oriented to person, place, and time. No cranial nerve deficit.  Skin: Skin is warm and dry. No rash noted.    Psychiatric: She has a normal mood and affect. Her behavior is normal.    ED Course  Procedures (including critical care time)  Labs Reviewed  URINALYSIS, ROUTINE W REFLEX MICROSCOPIC - Abnormal; Notable for the following:    APPearance CLOUDY (*)    All other components within normal limits  POCT I-STAT, CHEM 8 - Abnormal; Notable for the following:    Potassium 3.4 (*)    BUN <3 (*)    All other components within normal limits  POCT PREGNANCY, URINE  POCT PREGNANCY, URINE  I-STAT, CHEM 8   No results found.   No diagnosis found.    MDM   Patient here with back pain starting on Thursday. States that she's been tender when she stood up she's had pain in the left flank area which radiates around to her left groin. She states her urine has looked for some mild nausea. Denies any fever. Patient has been taking hydrocodone that she was prescribed for R. shoulder dislocation with only mild improvement. Denies any sciatica type symptoms no weakness or numbness. Not currently pregnant. On exam she does have some left flank tenderness. Concern for possible kidney stone versus musculoskeletal pathology. Will give pain and nausea control. Check an i-STAT and a UA to further evaluate.  2:38 PM UA negative for blood in the urine or infection. Most likely musculoskeletal pain will treat symptomatically.     Gwyneth Sprout, MD 03/31/11 1438

## 2011-03-31 NOTE — ED Notes (Signed)
States bending over to pick something up several days ago and onset lower back pain, constant since onset. Denies history of back pain. Ambulatory, mae. Denies bowel/bladder changes

## 2011-03-31 NOTE — ED Notes (Signed)
Pt states that she had onset of lower back pain on Wed. After leaning over to pick up a penny.  Pt states that pain has remained constant.  Pt denies any n/v.

## 2011-05-09 ENCOUNTER — Telehealth: Payer: Self-pay | Admitting: *Deleted

## 2011-05-09 ENCOUNTER — Encounter: Payer: Self-pay | Admitting: Family Medicine

## 2011-05-09 ENCOUNTER — Ambulatory Visit (INDEPENDENT_AMBULATORY_CARE_PROVIDER_SITE_OTHER): Payer: Medicaid Other | Admitting: Family Medicine

## 2011-05-09 VITALS — BP 128/86 | HR 96 | Temp 98.3°F | Ht 64.0 in | Wt 182.1 lb

## 2011-05-09 DIAGNOSIS — M549 Dorsalgia, unspecified: Secondary | ICD-10-CM | POA: Insufficient documentation

## 2011-05-09 DIAGNOSIS — R1031 Right lower quadrant pain: Secondary | ICD-10-CM

## 2011-05-09 DIAGNOSIS — F319 Bipolar disorder, unspecified: Secondary | ICD-10-CM

## 2011-05-09 DIAGNOSIS — N912 Amenorrhea, unspecified: Secondary | ICD-10-CM

## 2011-05-09 MED ORDER — ALBUTEROL SULFATE HFA 108 (90 BASE) MCG/ACT IN AERS: 2.0000 | INHALATION_SPRAY | Freq: Four times a day (QID) | RESPIRATORY_TRACT | Status: DC | PRN

## 2011-05-09 MED ORDER — MELOXICAM 15 MG PO TABS: 15.0000 mg | ORAL_TABLET | Freq: Every day | ORAL | Status: DC

## 2011-05-09 NOTE — Patient Instructions (Signed)
Thank you for coming in today. Your back and neck pain are muscle.  They will get better on their own.  Give it two more weeks.  See me if no improvement.  Reduce your smoking if you can. Work to 10 cigarettes a day.  We will do a pregnancy test today.

## 2011-05-09 NOTE — Telephone Encounter (Signed)
Called pharmacist. Spoke with Misty Stanley and informed, that Dr. Denyse Amass did not rx hydrocodone today. (Mobic was rx'd) .Arlyss Repress

## 2011-05-10 ENCOUNTER — Encounter: Payer: Self-pay | Admitting: Family Medicine

## 2011-05-10 DIAGNOSIS — R1031 Right lower quadrant pain: Secondary | ICD-10-CM | POA: Insufficient documentation

## 2011-05-10 NOTE — Assessment & Plan Note (Signed)
Pain in her abdomen is consistent with a injured muscle in her abdominal wall following a coughing spell.  She has no red flag signs or symptoms today.  Her pain is improving there by reduce the likelihood of serious intra-abdominal process.  Red flag precautions reviewed with patient who expresses understanding. She will followup in 2 weeks for reevaluation.

## 2011-05-10 NOTE — Assessment & Plan Note (Signed)
This to be is a serious issue as it has gone untreated since may of 2012.  Asked the patient to return to clinic in 2 weeks will be considered much more time talking about her psychiatric issues.  I suspect that she will likely do better on anti-bipolar medications such as Lamictal or valproate.

## 2011-05-10 NOTE — Assessment & Plan Note (Signed)
Back pain present for 3-4 weeks now.  Consistent with musculoskeletal type back pain. No red flags presents today.  Plan for continued activity with some rehabilitation and NSAIDs and Tylenol as needed for pain.  We'll followup in 2-3 weeks with reevaluation. At that point if pain still present we'll proceed with x-rays as pain has been present for 6 weeks. She expresses understanding

## 2011-05-10 NOTE — Assessment & Plan Note (Signed)
Urine pregnancy is negative today. Her amenorrhea is likely secondary to polycystic ovarian syndrome. We'll address this issue in 2 weeks.

## 2011-05-10 NOTE — Progress Notes (Signed)
Ms. Nicoletti is a 31 year old woman presents with pelvic pain and back pain.   Pelvic pain been present for 3 days located in the right groin. It occurred following along coughing spell. She notes pain with straining coughing and sitting up.  Overall the pain is improving. She denies any vaginal discharge diarrhea constipation nausea vomiting fevers or chills.    Back pain: Present for 3-4 weeks. Located in the bilateral lower back along the paraspinal muscle groups. No radiculopathy. She does have pain with activity. She has chronic right hip pain secondary to degenerative joint disease and is currently applying for disability for that therefore she does not work.  Her back pain has not limited her activity.  Denies any weakness or numbness associated with this back pain.  She's taking Tylenol ibuprofen for this.  Additionally she notes continued anxiety and she has run out of albuterol.   She would like to talk about these issues at a followup visit.  Additionally patient has amenorrhea now for 5 months with multiple negative pregnancy tests.  PMH reviewed.  ROS as above otherwise neg Medications reviewed. Current Outpatient Prescriptions  Medication Sig Dispense Refill  . albuterol (PROVENTIL HFA;VENTOLIN HFA) 108 (90 BASE) MCG/ACT inhaler Inhale 2 puffs into the lungs every 6 (six) hours as needed. For shortness of breath.  1 Inhaler  3  . meloxicam (MOBIC) 15 MG tablet Take 1 tablet (15 mg total) by mouth daily.  14 tablet  0    Exam:  BP 128/86  Pulse 96  Temp(Src) 98.3 F (36.8 C) (Oral)  Ht 5\' 4"  (1.626 m)  Wt 182 lb 1.6 oz (82.6 kg)  BMI 31.26 kg/m2  LMP 01/07/2011 Gen: Well NAD,  Anxious appearing smelling of tobacco. HEENT: EOMI,  MMM Lungs: CTABL Nl WOB Heart: RRR no MRG Abd: NABS, NT, ND no masses noted.  Not painful with sitting up or Valsalva. Exts: Non edematous BL  LE, warm and well perfused.  MSK: Nontender over spinal midline. Mild bilateral lumbar paraspinal  tenderness to palpation.  Gait is normal strength is preserved reflexes are equal bilaterally. Sensation is intact. Patient is easily able to get onto and off exam table by herself. Psych: Alert and oriented x3 anxious appearing normal affect and speech no delusions or hallucinations expressed.

## 2011-05-24 ENCOUNTER — Ambulatory Visit: Payer: Medicaid Other | Admitting: Family Medicine

## 2011-05-27 ENCOUNTER — Ambulatory Visit (INDEPENDENT_AMBULATORY_CARE_PROVIDER_SITE_OTHER): Payer: Medicaid Other | Admitting: Family Medicine

## 2011-05-27 ENCOUNTER — Encounter: Payer: Self-pay | Admitting: Family Medicine

## 2011-05-27 ENCOUNTER — Telehealth: Payer: Self-pay | Admitting: *Deleted

## 2011-05-27 VITALS — BP 132/95 | HR 90 | Ht 64.0 in | Wt 187.0 lb

## 2011-05-27 DIAGNOSIS — F319 Bipolar disorder, unspecified: Secondary | ICD-10-CM

## 2011-05-27 DIAGNOSIS — M25559 Pain in unspecified hip: Secondary | ICD-10-CM

## 2011-05-27 DIAGNOSIS — M25551 Pain in right hip: Secondary | ICD-10-CM | POA: Insufficient documentation

## 2011-05-27 MED ORDER — HYDROCODONE-ACETAMINOPHEN 5-500 MG PO TABS: 1.0000 | ORAL_TABLET | Freq: Three times a day (TID) | ORAL | Status: DC | PRN

## 2011-05-27 NOTE — Progress Notes (Signed)
Stacey Wong is a 31 y.o. female who presents to [facility] today for   1) Right hip pain:   Been present in her groin for the last several years. She has had evaluations in the past with [facility] orthopedics. Today she brings in a CD a contains her x-rays. She is unsure of her diagnosis but thinks it is "due to how she was born".  She describes pain at night and prevents her from sleeping despite adequate trials of Tylenol meloxicam.  She denies any radicular pain numbness or weakness.  2) bipolar affective disorder:  [name] has a diagnosis of bipolar disorder.  She denies any history of mania or been hospitalized for mania. Point she was hospitalized for suicidal ideation when she was a young woman.  She mentions many periods of depression throughout her life.  Additionally this his comorbid with marijuana and alcohol use.  She additionally has a component of panic disorder. She describes panic attacks occurring every few weeks some are short-lived and mild and some prompting her to go to emergency room with feeling like she's had a heart attack.   She is anxious about starting medicines for bipolar disorder and she feels that the side effects are dangerous. She's had a 6 month trial of Lamictal which she didn't think helped much. Additionally she has tried Seroquel for one week but stopped it as it was sedating and she read that she might develop liver failure. She's had a friend who took lithium and is afraid of lithium.   PMH reviewed.  ROS as above otherwise neg Medications reviewed. Current Outpatient Prescriptions  Medication Sig Dispense Refill  . albuterol (PROVENTIL HFA;VENTOLIN HFA) 108 (90 BASE) MCG/ACT inhaler Inhale 2 puffs into the lungs every 6 (six) hours as needed. For shortness of breath.  1 Inhaler  3  . HYDROcodone-acetaminophen (VICODIN) 5-500 MG per tablet Take 1 tablet by mouth every 8 (eight) hours as needed for pain.  30 tablet  0  . meloxicam (MOBIC) 15 MG tablet Take 1  tablet (15 mg total) by mouth daily.  14 tablet  0    Exam:  BP 132/95  Pulse 90  Ht 5\' 4"  (1.626 m)  Wt 187 lb (84.823 kg)  BMI 32.10 kg/m2  LMP 01/07/2011 Gen: Well NAD  MSK:  Nontender over right greater trochanter. Gait is painful. Somewhat reduced range of motion of the right hip. Mood; mildly anxious appearing normal eye contact. Speech is normal thought process is linear and goal-directed no suicidal or homicidal ideation.  AP pelvis from [facility] 08/08/10

## 2011-05-27 NOTE — Assessment & Plan Note (Signed)
Stacey Wong  certainly has a mood disorder suspicious for bipolar NOS versus bipolar 2. She is very resistant to starting medications. Additionally she has comorbid panic and substance abuse.  This is a very complicated picture. Plan: Spent 40 minutes today talking about options.  Patient will research Zyprexa/fluoxetine for a followup visit in one to 2 weeks. We discussed the dissonance of taking alcohol and marijuana to treat her panic and mood disorder while being fearful of side effects from prescription medications. She agrees that this is not a rational view to hold, and I agree that feeling are hard to control.  Additionally she will work on cutting back in quitting alcohol and marijuana and follow up with me in one to 2 weeks.

## 2011-05-27 NOTE — Patient Instructions (Signed)
Thank you for coming in today. We will try to get you into an ortho doctor about your right hip.. Look up Zyprex and Prozac. My plan is to start those medications as the next visit. Goal to prevent panic attacks and depression, so you feel better.  Stop Mariajuana as it is increasing your anxiety.  Work on stopping alcohol as it too is working Product/process development scientist.  Think hard about the damage the medications you are using (alcohol and Mariajuana) is doing to your body and the risk on medications I might prescribe.   See me in 1-2 weeks.

## 2011-05-27 NOTE — Assessment & Plan Note (Signed)
She has chronic right hip pain despite conservative therapy. Her x-ray is certainly abnormal and suspicious for congenital hip dysplasia.   Plan to refer back to [facility] orthopedics. It's possible that she would benefit from a dilated cortisone injection however I am suspicious that she may need an early hip replacement. Plan to prescribe hydrocodone to get her to orthopedics appointment.

## 2011-05-27 NOTE — Telephone Encounter (Signed)
Called pt. Left message to call back. Please tell pt: APPT AT [facility] 06-19-11 AT 2PM. [facility] # [id number]  .Arlyss Repress

## 2011-05-30 ENCOUNTER — Encounter: Payer: Self-pay | Admitting: *Deleted

## 2011-05-30 NOTE — Telephone Encounter (Signed)
Pt called back message given

## 2011-05-30 NOTE — Telephone Encounter (Signed)
Called pt again and left message. Waiting for call back to inform of appt. See previous message. Lorenda Hatchet, Renato Battles

## 2011-06-04 ENCOUNTER — Telehealth: Payer: Self-pay | Admitting: Family Medicine

## 2011-06-04 NOTE — Telephone Encounter (Signed)
Called patient told her she would need an appointment with PCP to discuss pain meds, she says she is in a lot of pain and needs to be seen as soon as possible. I scheduled her for tomorrow on cross cover with Dr Denyse Amass who is her PCP.Busick, Rodena Medin

## 2011-06-04 NOTE — Telephone Encounter (Signed)
Pain meds are not helping and needs it increased

## 2011-06-05 ENCOUNTER — Encounter: Payer: Self-pay | Admitting: Family Medicine

## 2011-06-05 ENCOUNTER — Ambulatory Visit (INDEPENDENT_AMBULATORY_CARE_PROVIDER_SITE_OTHER): Payer: Medicaid Other | Admitting: Family Medicine

## 2011-06-05 VITALS — BP 140/92 | HR 108 | Ht 64.0 in | Wt 187.0 lb

## 2011-06-05 DIAGNOSIS — M25551 Pain in right hip: Secondary | ICD-10-CM

## 2011-06-05 DIAGNOSIS — F319 Bipolar disorder, unspecified: Secondary | ICD-10-CM

## 2011-06-05 DIAGNOSIS — M25559 Pain in unspecified hip: Secondary | ICD-10-CM

## 2011-06-05 MED ORDER — OXYCODONE-ACETAMINOPHEN 5-325 MG PO TABS: 1.0000 | ORAL_TABLET | Freq: Three times a day (TID) | ORAL | Status: DC | PRN

## 2011-06-05 NOTE — Progress Notes (Signed)
Stacey Wong is a 31 y.o. female who presents to [facility] today for   1) Right Hip Pain. Continued to be very bothersome despite Vicodin. Would take up to 2 pills and still not get adequate pain relief. She is an appointment February 20 at her orthopedists.  She denies any fevers chills but does note difficulty walking and laying on her right side and range of motion of her right hip  2) mood disorder: History of bipolar affective disorder. Most the paperwork at the last visit so never did look up information about Zyprexa or fluoxetine.  Does note continued anxiety. She wants to get stable so that she can maintain custody of her children.  Is thinking about starting medications.  PMH reviewed. Significant for right congenital hip issue and bipolar affective disorder. Social history significant for smoking marijuana and alcohol ROS as above otherwise neg Medications reviewed. Current Outpatient Prescriptions  Medication Sig Dispense Refill  . albuterol (PROVENTIL HFA;VENTOLIN HFA) 108 (90 BASE) MCG/ACT inhaler Inhale 2 puffs into the lungs every 6 (six) hours as needed. For shortness of breath.  1 Inhaler  3  . oxyCODONE-acetaminophen (PERCOCET) 5-325 MG per tablet Take 1-2 tablets by mouth every 6 (six) hours as needed for pain.  15 tablet  0  . oxyCODONE-acetaminophen (ROXICET) 5-325 MG per tablet Take 1 tablet by mouth every 8 (eight) hours as needed for pain.  30 tablet  0    Exam:  BP 140/92  Pulse 108  Ht 5\' 4"  (1.626 m)  Wt 187 lb (84.823 kg)  BMI 32.10 kg/m2  LMP 01/07/2011 Gen: Well NAD Lungs: CTABL Nl WOB Heart: RRR no MRG Abd: NABS, NT, ND Exts: Non edematous BL  LE, warm and well perfused.  MSK: Right hip reduced internal and external range of motion pain with range of motion including flexion.Marland Kitchen Psych: Alert and oriented mildly anxious normal affect speech thought process is linear no suicidal or homicidal ideation.

## 2011-06-05 NOTE — Assessment & Plan Note (Signed)
Stacey Wong is in the complimentative stage of change, regarding her willingness to start oral medication. Plan to followup in one to 2 weeks and hopefully we will start Zyprexa and fluoxetine at that time. Spent 15 minutes discussing this issue today. He is motivational interviewing techniques to treat cognitive dissonance as about why she does not want to take medications and what she hopes to gain from that.

## 2011-06-05 NOTE — Patient Instructions (Addendum)
Thank you for coming in today. We are switching to percocet until you can get to the orthopedic doctor.  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  It hard but not impossible to get pregnant. Use condoms if you have sex.  We will address your cycles when we get these big fish out of the way or if it becomes a bigger problem.  Look up Zyprex and Prozac. My plan is to start those medications as the next visit. Goal to prevent panic attacks and depression, so you feel better.  Stop Mariajuana as it is increasing your anxiety.  Work on stopping alcohol as it too is working Product/process development scientist.  Think hard about the damage the medications you are using (alcohol and Mariajuana) is doing to your body and the risk on medications I might prescribe.   See me in 1-2 weeks.

## 2011-06-05 NOTE — Assessment & Plan Note (Signed)
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 She certainly has an extremely degenerative joint based on her history and x-rays.   She is an appointment on February 20 with her orthopedist.  Plan to provide pain medicine until then and followup shortly. Hopefully she'll get a guided hip injection.

## 2011-06-14 ENCOUNTER — Encounter: Payer: Self-pay | Admitting: Family Medicine

## 2011-06-14 ENCOUNTER — Ambulatory Visit (INDEPENDENT_AMBULATORY_CARE_PROVIDER_SITE_OTHER): Payer: Medicaid Other | Admitting: Family Medicine

## 2011-06-14 VITALS — BP 140/85 | HR 99 | Ht 64.0 in | Wt 189.0 lb

## 2011-06-14 DIAGNOSIS — M25559 Pain in unspecified hip: Secondary | ICD-10-CM

## 2011-06-14 DIAGNOSIS — F319 Bipolar disorder, unspecified: Secondary | ICD-10-CM

## 2011-06-14 DIAGNOSIS — M25551 Pain in right hip: Secondary | ICD-10-CM

## 2011-06-14 DIAGNOSIS — J069 Acute upper respiratory infection, unspecified: Secondary | ICD-10-CM

## 2011-06-14 MED ORDER — FLUOXETINE HCL 20 MG PO CAPS: 20.0000 mg | ORAL_CAPSULE | Freq: Every day | ORAL | Status: DC

## 2011-06-14 MED ORDER — OXYCODONE-ACETAMINOPHEN 5-325 MG PO TABS: 1.0000 | ORAL_TABLET | Freq: Three times a day (TID) | ORAL | Status: DC | PRN

## 2011-06-14 MED ORDER — OLANZAPINE 5 MG PO TABS: 5.0000 mg | ORAL_TABLET | Freq: Every day | ORAL | Status: DC

## 2011-06-14 NOTE — Patient Instructions (Signed)
Thank you for coming in today. For Bipolar will start Olanzapine 5mg  at night and fluoxetine  20mg  either at night or during the day.  Use tylenol as needed for cold symptoms but watch out your percocet also has tylenol (acetimonephen). Do not take more than 1000mg  every 6 hours.  See me in 1 week.  Let me know if you feel weird or have thoughts of hurting yourself or others.

## 2011-06-14 NOTE — Assessment & Plan Note (Signed)
Viral URI likely. No indication of bacterial infection. Plan to symptomatically with Tylenol and humidifier. Warned about accidental Tylenol overdose with Percocets. Please see patient instructions.

## 2011-06-14 NOTE — Assessment & Plan Note (Signed)
Plan to start olanzapine and fluoxetine at low dose 5/20 respectively. We'll followup in one week. Warned about increasing agitation trouble sleeping suicidal or homicidal thoughts. Patient expresses understanding.

## 2011-06-14 NOTE — Progress Notes (Signed)
Stacey Wong is a 31 y.o. female who presents to [facility] today for   1) mood: Continues to have anxiety symptoms related to her bipolar disorder. In the interim has researched olanzapine and fluoxetine and agrees to initiate low dose therapy. She denies any current suicidal or homicidal ideation.  2) right hip pain: Has been evaluated by orthopedists and will be scheduled for a dilated hip injection in one week. Has run out of Percocet and was prescribed tramadol by the orthopedists. This medication has already been tried and was insufficient to control the pain. Patient requests a refill of Percocet.  3) URI symptoms: Present for the last 2 days associated with cough congestion and sore throat. Denies any fevers chills body aches trouble breathing or chest pain.  Notes that her daughter has recently been sick as well. Has not tried any medications yet.   PMH reviewed. Significant for right congenital hip pain and bipolar disorder ROS as above otherwise neg Medications reviewed. Current Outpatient Prescriptions  Medication Sig Dispense Refill  . albuterol (PROVENTIL HFA;VENTOLIN HFA) 108 (90 BASE) MCG/ACT inhaler Inhale 2 puffs into the lungs every 6 (six) hours as needed. For shortness of breath.  1 Inhaler  3  . oxyCODONE-acetaminophen (ROXICET) 5-325 MG per tablet Take 1 tablet by mouth every 8 (eight) hours as needed for pain.  30 tablet  0  . FLUoxetine (PROZAC) 20 MG capsule Take 1 capsule (20 mg total) by mouth daily.  30 capsule  1  . OLANZapine (ZYPREXA) 5 MG tablet Take 1 tablet (5 mg total) by mouth at bedtime.  30 tablet  1  . oxyCODONE-acetaminophen (PERCOCET) 5-325 MG per tablet Take 1-2 tablets by mouth every 6 (six) hours as needed for pain.  15 tablet  0  . DISCONTD: oxyCODONE-acetaminophen (ROXICET) 5-325 MG per tablet Take 1 tablet by mouth every 8 (eight) hours as needed for pain.  30 tablet  0    Exam:  BP 140/85  Pulse 99  Ht 5\' 4"  (1.626 m)  Wt 189 lb (85.73 kg)  BMI  32.44 kg/m2  LMP 01/12/2011 Gen: Well NAD HEENT: EOMI,  MMM, normal tympanic membranes bilaterally. Posterior pharynx erythematous without exudate. Lungs: CTABL Nl WOB Heart: RRR no MRG Abd: NABS, NT, ND Exts: Non edematous BL  LE, warm and well perfused.  Psych: Alert and oriented speech is normal thought process linear and goal-directed affect normal no delusions hallucinations suicidal or homicidal ideation expressed.

## 2011-06-14 NOTE — Assessment & Plan Note (Signed)
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 Have referred to orthopedics who is scheduling a injection. Will refill pain medicines and followup in one week.

## 2011-06-20 ENCOUNTER — Encounter: Payer: Self-pay | Admitting: Family Medicine

## 2011-06-20 ENCOUNTER — Ambulatory Visit (INDEPENDENT_AMBULATORY_CARE_PROVIDER_SITE_OTHER): Payer: Medicaid Other | Admitting: Family Medicine

## 2011-06-20 VITALS — BP 149/98 | HR 88 | Temp 98.1°F | Ht 64.0 in | Wt 189.0 lb

## 2011-06-20 DIAGNOSIS — I10 Essential (primary) hypertension: Secondary | ICD-10-CM

## 2011-06-20 DIAGNOSIS — F319 Bipolar disorder, unspecified: Secondary | ICD-10-CM

## 2011-06-20 DIAGNOSIS — M25551 Pain in right hip: Secondary | ICD-10-CM

## 2011-06-20 DIAGNOSIS — M549 Dorsalgia, unspecified: Secondary | ICD-10-CM

## 2011-06-20 DIAGNOSIS — M25559 Pain in unspecified hip: Secondary | ICD-10-CM

## 2011-06-20 MED ORDER — HYDROCHLOROTHIAZIDE 25 MG PO TABS: 25.0000 mg | ORAL_TABLET | Freq: Every day | ORAL | Status: DC

## 2011-06-20 MED ORDER — OXYCODONE-ACETAMINOPHEN 5-325 MG PO TABS: 1.0000 | ORAL_TABLET | Freq: Three times a day (TID) | ORAL | Status: DC | PRN

## 2011-06-20 MED ORDER — CYCLOBENZAPRINE HCL 10 MG PO TABS: 10.0000 mg | ORAL_TABLET | Freq: Three times a day (TID) | ORAL | Status: DC | PRN

## 2011-06-20 MED ORDER — OLANZAPINE 10 MG PO TABS: 10.0000 mg | ORAL_TABLET | Freq: Every day | ORAL | Status: DC

## 2011-06-20 NOTE — Assessment & Plan Note (Signed)
Exacerbation of the back pain started several months ago. Pain SI joint without any radiculopathy or red flag signs or symptoms. Plan to treat with Tylenol/ibuprofen and Flexeril.  Followup in one week

## 2011-06-20 NOTE — Assessment & Plan Note (Signed)
Plan to increase olanzapine to 10 mg and followup in one week. Doing well currently

## 2011-06-20 NOTE — Progress Notes (Signed)
Patient ID: ARIBELLA VAVRA, female   DOB: [birth year], 31 y.o.   MRN: [id number] FATIMA FEDIE is a 31 y.o. female who presents to [facility] today for   1) Mood: Diagnosed with Bipolar disorder. Is taking medications as listed below. No side effects. Not feeling much different. 1st started taking the medicine a few days ago.  PHQ9 18.  GAD7  19  2) Back Pain: Recurrentlower back pain. Worsened yesterday. Has had a history of previous back pain. Taking percocet for hip pain for this issue as well.  No leg weakness, numbness or bowel or bladder dysfunction.    3) Hip Pain: Has appointment with orthopedics next week for follow up visit and guided hip injection. Continues to have pain.   4) HTN: No chest pain. Never been on BP medications in the past. No chest pain palpitations swelling dyspnea or syncope.   PMH reviewed. Significant for bipolar disorder.  ROS as above otherwise neg Medications reviewed. Current Outpatient Prescriptions  Medication Sig Dispense Refill  . albuterol (PROVENTIL HFA;VENTOLIN HFA) 108 (90 BASE) MCG/ACT inhaler Inhale 2 puffs into the lungs every 6 (six) hours as needed. For shortness of breath.  1 Inhaler  3  . FLUoxetine (PROZAC) 20 MG capsule Take 1 capsule (20 mg total) by mouth daily.  30 capsule  1  . OLANZapine (ZYPREXA) 5 MG tablet Take 1 tablet (5 mg total) by mouth at bedtime.  30 tablet  1  . oxyCODONE-acetaminophen (ROXICET) 5-325 MG per tablet Take 1 tablet by mouth every 8 (eight) hours as needed for pain.  30 tablet  0  . oxyCODONE-acetaminophen (PERCOCET) 5-325 MG per tablet Take 1-2 tablets by mouth every 6 (six) hours as needed for pain.  15 tablet  0    Exam:  BP 149/98  Pulse 88  Temp(Src) 98.1 F (36.7 C) (Oral)  Ht 5\' 4"  (1.626 m)  Wt 189 lb (85.73 kg)  BMI 32.44 kg/m2  LMP 01/12/2011 Gen: Well NAD Lungs: CTABL Nl WOB Heart: RRR no MRG Abd: NABS, NT, ND Exts: Non edematous BL  LE, warm and well perfused. MSK: Nontender over spinal midline.  Jenna palpation over  left SI joint . Negative straight leg raise test.   Psych: Alert and oriented anxious-appearing speech normal thought process goal-directed no delusions or hallucinations expressed.

## 2011-06-20 NOTE — Assessment & Plan Note (Signed)
Has an appointment with orthopedics in one week for followup in one week at that visit

## 2011-06-20 NOTE — Patient Instructions (Addendum)
Thank you for coming in today. For bipolar: I am increasing olanzapine to 10 mg a day. Please start taking the new dose and followup with me in one week. For hip pain I refilled your Percocet. I think you're taking these medicines too much. Please make sure 30 pills lasts longer than one week. You can supplement with ibuprofen. For back pain I added Flexeril as needed. Stay active is the most important thing. Let me know if you have numbness or weakness or problems with your bowel or bladder. For blood pressure I started hydrochlorothiazide. Take this in the morning.  I will see you in one week.

## 2011-06-20 NOTE — Assessment & Plan Note (Signed)
Blood pressure elevated and multiple visits. Plan to start hydrochlorothiazide today in followup in one week

## 2011-06-27 ENCOUNTER — Encounter: Payer: Self-pay | Admitting: Family Medicine

## 2011-06-27 ENCOUNTER — Ambulatory Visit (INDEPENDENT_AMBULATORY_CARE_PROVIDER_SITE_OTHER): Payer: Medicaid Other | Admitting: Family Medicine

## 2011-06-27 DIAGNOSIS — M25559 Pain in unspecified hip: Secondary | ICD-10-CM

## 2011-06-27 DIAGNOSIS — M25551 Pain in right hip: Secondary | ICD-10-CM

## 2011-06-27 DIAGNOSIS — F319 Bipolar disorder, unspecified: Secondary | ICD-10-CM

## 2011-06-27 NOTE — Patient Instructions (Signed)
Thank you for coming in today. Please continue your bipolar medicines.  Write everything down you eat for the 3 days before you come back.  See me in 2-4 weeks.

## 2011-06-27 NOTE — Assessment & Plan Note (Signed)
Doing well status post fluoroscopic guided right hip injection via orthopedics.  We'll followup in 2-4 weeks. Hopefully pain relief the last for many months.

## 2011-06-27 NOTE — Progress Notes (Signed)
Patient ID: Stacey Wong, female   DOB: Mar 13, 1981, 31 y.o.   MRN: [id number] NAVDEEP FESSENDEN is a 31 y.o. female who presents to [facility] today for   1) bipolar. Currently on fluoxetine and olanzapine.  Notes that she seems to be less rattled by everyday life events. She is less irritable that. She previously has been. She denies any suicidal or homicidal ideation. He is happy with the current regimen.  2) right hip pain: Had fluoroscopic guided right hip injection in the interim. She noted a great deal of pain during and for the 2 days following the procedure.  However she woke this morning with no pain for the first time in years.  She is happy with how things are going currently and is not having to take Percocet.   3) irregular menstrual cycles: Is currently having a period for the first time in 6 months.  4) obesity: Would like to lose weight is interested in a dedicated visit for weight loss.    PMH reviewed. Significant for bipolar disorder and congenital right hip issues. Currently is a smoker. ROS as above otherwise neg Medications reviewed. Current Outpatient Prescriptions  Medication Sig Dispense Refill  . albuterol (PROVENTIL HFA;VENTOLIN HFA) 108 (90 BASE) MCG/ACT inhaler Inhale 2 puffs into the lungs every 6 (six) hours as needed. For shortness of breath.  1 Inhaler  3  . FLUoxetine (PROZAC) 20 MG capsule Take 1 capsule (20 mg total) by mouth daily.  30 capsule  1  . hydrochlorothiazide (HYDRODIURIL) 25 MG tablet Take 1 tablet (25 mg total) by mouth daily.  30 tablet  1  . OLANZapine (ZYPREXA) 10 MG tablet Take 1 tablet (10 mg total) by mouth at bedtime.  30 tablet  1  . cyclobenzaprine (FLEXERIL) 10 MG tablet Take 1 tablet (10 mg total) by mouth 3 (three) times daily as needed for muscle spasms.  30 tablet  0    Exam:  BP 131/82  Pulse 69  Ht 5\' 4"  (1.626 m)  Wt 184 lb (83.462 kg)  BMI 31.58 kg/m2  LMP 06/27/2011 Gen: Well NAD HEENT: EOMI,  MMM Lungs: CTABL Nl WOB Heart: RRR  no MRG Abd: NABS, NT, ND Exts: Non edematous BL  LE, warm and well perfused.  MSK: Decreased range of motion of the right hip but no pain.  Psych: Alert and oriented normal affect normal speech process is linear and goal-directed. No SI/HI.

## 2011-06-27 NOTE — Assessment & Plan Note (Signed)
Doing well on olanzapine fluoxetine 10/20.  Will hold this dose for 2-4 weeks and followup.

## 2011-07-01 ENCOUNTER — Telehealth: Payer: Self-pay | Admitting: Family Medicine

## 2011-07-01 NOTE — Telephone Encounter (Signed)
Had shot a couple of weeks ago and is now in pain again.  Wants to know if she can get pain pills.

## 2011-07-02 NOTE — Telephone Encounter (Signed)
Left message to call us. Pt needs OV. Lorenda Hatchet, Renato Battles (do not see pain meds!)

## 2011-07-02 NOTE — Telephone Encounter (Signed)
Pt called back. Advised that we would not give pain medications without OV. Pt verbalized understanding. Lorenda Hatchet, Renato Battles

## 2011-07-05 ENCOUNTER — Telehealth: Payer: Self-pay | Admitting: Family Medicine

## 2011-07-05 MED ORDER — HYDROCODONE-ACETAMINOPHEN 5-500 MG PO TABS: 1.0000 | ORAL_TABLET | Freq: Three times a day (TID) | ORAL | Status: DC | PRN

## 2011-07-05 NOTE — Telephone Encounter (Signed)
Was visiting with daughter today. Notes intense right hip pain. Has an appointment on Monday. Hip painful to ROM.

## 2011-07-08 ENCOUNTER — Ambulatory Visit: Payer: Medicaid Other | Admitting: Family Medicine

## 2011-07-11 ENCOUNTER — Ambulatory Visit (INDEPENDENT_AMBULATORY_CARE_PROVIDER_SITE_OTHER): Payer: Medicaid Other | Admitting: Family Medicine

## 2011-07-11 ENCOUNTER — Encounter: Payer: Self-pay | Admitting: Family Medicine

## 2011-07-11 VITALS — BP 130/80 | HR 96 | Ht 64.0 in | Wt 182.7 lb

## 2011-07-11 DIAGNOSIS — M25551 Pain in right hip: Secondary | ICD-10-CM

## 2011-07-11 DIAGNOSIS — M25559 Pain in unspecified hip: Secondary | ICD-10-CM

## 2011-07-11 DIAGNOSIS — F319 Bipolar disorder, unspecified: Secondary | ICD-10-CM

## 2011-07-11 MED ORDER — FLUOXETINE HCL 40 MG PO CAPS: 40.0000 mg | ORAL_CAPSULE | Freq: Every day | ORAL | Status: DC

## 2011-07-11 MED ORDER — OXYCODONE-ACETAMINOPHEN 5-325 MG PO TABS: 1.0000 | ORAL_TABLET | Freq: Three times a day (TID) | ORAL | Status: AC | PRN

## 2011-07-11 NOTE — Patient Instructions (Signed)
Thank you for coming in today. Please try to get an appointment with your orthopedic doctor ASAP.  If you feel like hurting yourself or others please call me or [phone].  Please get the new fluoxetine dose.  Please take the medications at night.  I will give a 1 month supply. Please make sure it lasts.  We will set you up with physical therapy.

## 2011-07-12 ENCOUNTER — Encounter: Payer: Self-pay | Admitting: Family Medicine

## 2011-07-12 NOTE — Assessment & Plan Note (Signed)
I think the pain is not coming from the hip joint itself as she has no pain with range of motion of the hip. I believe that her pain is due to hip weakness with an abnormal gait.   Plan to provide pain medicines for one month and refer to physical therapy. Additionally we'll use a half centimeter lift in the right shoe to help achieve a normal leg length. I do not have supply in the clinic today but will obtain a sports medicine.  I plan to have patient followup in 2 weeks where we will provide the lift. She expresses understanding.

## 2011-07-12 NOTE — Progress Notes (Signed)
Stacey Wong is a 31 y.o. female who presents to [facility] today for   1) bipolar disorder: Patient is taking fluoxetine 20 mg in a lot of pain 10 mg. She has noted increasing irritibility and anxiety over the last week.  Additionally she notes that she is more depressed than usual. She denies any use of marijuana or alcohol.  She states that she occasionally has a passive homicidal thought that is nonspecific anyone.  However she denies any specific person she would like to her nor any plan.  Additionally she notes that the level of her daughter keeps her from hurting herself or anyone.  She denies any suicidal ideation.  PHQ9 is 7  GAD 7 is 7  2) right hip pain: Has worsened since the last visit. She had initial relief following guided injection and they have now she has continued pain. Hydrocodone if not effective. She denies any fevers or chills numbness or weakness. She notes that laying on her right side and standing exacerbate her pain. She has not made a followup appointment with orthopedics.   PMH reviewed. Significant for bipolar and congenital hip problems ROS as above otherwise neg Medications reviewed. Current Outpatient Prescriptions  Medication Sig Dispense Refill  . albuterol (PROVENTIL HFA;VENTOLIN HFA) 108 (90 BASE) MCG/ACT inhaler Inhale 2 puffs into the lungs every 6 (six) hours as needed. For shortness of breath.  1 Inhaler  3  . FLUoxetine (PROZAC) 40 MG capsule Take 1 capsule (40 mg total) by mouth daily.  30 capsule  2  . hydrochlorothiazide (HYDRODIURIL) 25 MG tablet Take 1 tablet (25 mg total) by mouth daily.  30 tablet  1  . HYDROcodone-acetaminophen (VICODIN) 5-500 MG per tablet Take 1 tablet by mouth every 8 (eight) hours as needed for pain.  20 tablet  0  . OLANZapine (ZYPREXA) 10 MG tablet Take 1 tablet (10 mg total) by mouth at bedtime.  30 tablet  1  . oxyCODONE-acetaminophen (ROXICET) 5-325 MG per tablet Take 1 tablet by mouth every 8 (eight) hours as needed for pain.   60 tablet  0    Exam:  BP 130/80  Pulse 96  Ht 5\' 4"  (1.626 m)  Wt 182 lb 11.2 oz (82.872 kg)  BMI 31.36 kg/m2  LMP 06/27/2011 Gen: Well NAD, in pain appearing. Lungs: CTABL Nl WOB Heart: RRR no MRG Abd: NABS, NT, ND Exts: Non edematous BL  LE, warm and well perfused.  Psych: Alert and oriented x3, affect is normal. Thought process is linear and goal-directed. Speech is normal. No delusions or hallucinations expressed. No suicidality. As noted above past of homicidality but no plan or person directed at.   MSK: Nontender over the back or hip. Right hip range of motion nontender and diminished compared to the left side.  However range of motion is improved from the last visit.  Right hip abductor strength is 4/5. Left hip abductor strength is 5/5.  Leg length is unequal with right being 0.5 cm shorter.

## 2011-07-12 NOTE — Assessment & Plan Note (Addendum)
Increasing irritability recently, and decreasing mood.  I think this is part of her normal bipolar disorder but pain is certainly affecting her mood.  Plan to increase the fluoxetine to 40 mg a day.  We'll followup in 2 weeks and at that point may consider starting Lamictal as well.  I am concerned about the passive homicidal thoughts.  She has no plan and many reasons not to think about hurting anybody, but I am a bit concerned.  I did discuss my concern with the patient who expresses understanding. She will call me with any thoughts of hurting herself or others and followup in 2 weeks. Verbal contract for safety made.

## 2011-07-15 ENCOUNTER — Ambulatory Visit: Payer: Medicaid Other | Admitting: Family Medicine

## 2011-07-24 ENCOUNTER — Ambulatory Visit (INDEPENDENT_AMBULATORY_CARE_PROVIDER_SITE_OTHER): Payer: Medicaid Other | Admitting: Family Medicine

## 2011-07-24 ENCOUNTER — Encounter: Payer: Self-pay | Admitting: Family Medicine

## 2011-07-24 VITALS — BP 140/87 | HR 91 | Ht 64.0 in | Wt 182.9 lb

## 2011-07-24 DIAGNOSIS — M25559 Pain in unspecified hip: Secondary | ICD-10-CM

## 2011-07-24 DIAGNOSIS — M25551 Pain in right hip: Secondary | ICD-10-CM

## 2011-07-24 DIAGNOSIS — F319 Bipolar disorder, unspecified: Secondary | ICD-10-CM

## 2011-07-24 MED ORDER — NAPROXEN 500 MG PO TABS: 500.0000 mg | ORAL_TABLET | Freq: Two times a day (BID) | ORAL | Status: DC

## 2011-07-24 MED ORDER — OLANZAPINE 10 MG PO TABS: 10.0000 mg | ORAL_TABLET | Freq: Every day | ORAL | Status: DC

## 2011-07-24 MED ORDER — HYDROCODONE-ACETAMINOPHEN 5-325 MG PO TABS: 0.5000 | ORAL_TABLET | Freq: Two times a day (BID) | ORAL | Status: DC | PRN

## 2011-07-24 NOTE — Assessment & Plan Note (Signed)
Some pain in the right hip likely due to hip osteoarthritis.  The patient has pain-free range of motion of her hip I suspect she is much improved with the guided hip injection.  She does have pain in her right knee which I suspect is patellofemoral pain syndrome.  Plan for physical therapy with drastic reduction in her opiates. And prescription for Naprosyn.  Follow up with me in 2-4 weeks will resubmit order for physical therapy.

## 2011-07-24 NOTE — Assessment & Plan Note (Signed)
Bipolar disorder improved with fluoxetine 40 mg and olanzapine 10 mg.  Patient doing well with no SI/HI.  She is less irritable and feels better.  Plan to continue current regimen and followup in 2-4 weeks.

## 2011-07-24 NOTE — Progress Notes (Signed)
Stacey Wong is a 31 y.o. female who presents to [facility] today for   1) Mood: Dx with bipolar disorder. Taking Zyprexa 10 and Fluoxetine 40 daily. Pt notes increased forgetfulness recently Fluoxetine was recently increased and she has been taking oxycodone for pain recently. No SI/HI. Subjective improvement in baseline anxiety and depression.   PHQ9:  9 GAD7: 7  2) Pain: Located in the right leg. 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 Had a guided injection at [facility] orthopedics about 1 month ago that temporarily reduced her pain. Never did receive a phone call from physical therapy.  Additionally has significant anterior knee pain especially when rising from a sitting position and going upstairs.  3) hypertension: Takes hydrochlorothiazide daily. She just took her pill before coming to clinic today. When measures at home or the drug store her blood pressure is usually well controlled.  PMH, SH reviewed: Significant for hip pain and bipolar disorder .  Currently smoking  ROS as above otherwise neg. No Chest pain, palpitations, SOB, Fever, Chills, Abd pain, N/V/D.  Medications reviewed. Current Outpatient Prescriptions  Medication Sig Dispense Refill  . albuterol (PROVENTIL HFA;VENTOLIN HFA) 108 (90 BASE) MCG/ACT inhaler Inhale 2 puffs into the lungs every 6 (six) hours as needed. For shortness of breath.  1 Inhaler  3  . FLUoxetine (PROZAC) 40 MG capsule Take 1 capsule (40 mg total) by mouth daily.  30 capsule  2  . hydrochlorothiazide (HYDRODIURIL) 25 MG tablet Take 1 tablet (25 mg total) by mouth daily.  30 tablet  1  . OLANZapine (ZYPREXA) 10 MG tablet Take 1 tablet (10 mg total) by mouth at bedtime.  30 tablet  1    Exam:  BP 140/87  Pulse 91  Ht 5\' 4"  (1.626 m)  Wt 182 lb 14.4 oz (82.963 kg)  BMI 31.39 kg/m2  LMP 06/27/2011 Gen: Well NAD HEENT: EOMI,  MMM Lungs: CTABL Nl WOB Heart: RRR no MRG Abd: NABS, NT, ND Exts: Non edematous BL   LE, warm and well perfused.  SK: Nontender over the back or hip. Right hip range of motion nontender and diminished compared to the left side.  However range of motion is improved from the last visit.  Right hip abductor strength is 4/5. Left hip abductor strength is 5/5.  Leg length is unequal with right being 0.5 cm shorter.  Decreased vastus medialis muscle bulk of the right knee

## 2011-07-24 NOTE — Patient Instructions (Signed)
Thank you for coming in today. Please stop percocet or Alieve.  Take the vicodin 1/2 tab as needed twice a day.  Take naprosyn twice a day.  You can take up to 1000mg  of tylenol (acetaminophen) every 6 hours.  Followup with the orthopedic doctor. See me in 2-4 weeks.  If you don't hear from physical therapy within one week call back.   Excessive Lateral Patellar Compression Syndrome with Rehab Excessive lateral patellar compression syndrome involves an increase in pressure in the knee joint, that causes pain. The kneecap (patella) is a v-shaped bone that usually glides through the center of the groove in the thigh bone (trochlea). For people with this condition, the kneecap presses more on the outer (lateral) side of the groove, causing an increase in pressure and pain in the knee. This condition usually occurs without injury, although it may also follow an injury. SYMPTOMS    General, spread out knee pain, most commonly in the front half of the knee, behind the kneecap, or in the very back of the knee. Less commonly, the pain may be above or below the kneecap.   Pain that gets worse with sitting for long periods, rising from a sitting position, going up or down stairs or hills, kneeling, squatting, or wearing shoes with heels.   Often, pain with jumping.   Often, an aching pain.   Giving way, catching of the knee.   Minimal or no swelling, no locking.  CAUSES   Excessive lateral patellar compression syndrome is caused by weakness in the thigh (quadriceps) muscles, that causes the kneecap to track poorly within the knee joint. The poor tracking of the kneecap may also occur in people who have an improper alignment of the knee and leg (bowlegged, knock knees). The poor tracking of the kneecap results in an increase in pressure on the outer side of the knee. The membrane that holds the kneecap in place (retinaculum) is stretched, which causes pain. The pain gets worse with use of the thigh  (quadriceps) muscle. RISK INCREASES WITH:  Tight back of the thigh (hamstring), front of the thigh (quadriceps), or calf muscles.   Weak front of the thigh (quadriceps) muscles.   Failure to warm up properly before activity.   Sports that involve running, jumping, or squatting.   Poor alignment of the legs (bowlegged, knock knees).   Born with (congenital) malformation of the kneecap or thigh bone groove (trochlea).   Previous injury or surgery to the knee.   Direct injury to the kneecap (falling on the kneecap).  PREVENTION    Warm up and stretch properly before activity.   Maintain physical fitness:   Strength, flexibility, and endurance.   Cardiovascular fitness.   Use arch supports (orthotics) or knee pads.  PROGNOSIS   If treated properly, excessive lateral patellar compression syndrome is usually curable. If left untreated, the condition does not usually result in a permanent condition. RELATED COMPLICATIONS    Frequently recurring symptoms and disability, severe enough to decrease an athlete's competitive ability.   Arthritis of the kneecap.   Kneecap dislocations.   Risks of surgery: infection, bleeding, injury to nerves (numbness, weakness, paralysis), knee stiffness, dislocation of the kneecap, weakness, continued pain, compartment syndrome (if surgery is performed to cut the bone of the leg and move it).  TREATMENT   Treatment first involves ice and medicine, to reduce pain and inflammation. Since weak muscles often cause the condition, it is important to complete strengthening and stretching exercises to help  the kneecap track properly in the thigh bone groove. These exercises may be performed at home or with a therapist. Your caregiver may recommend that you wear a knee brace, to help the kneecap track properly. For people with flat feet, orthotics may be advised. If non-surgical treatment is unsuccessful, surgery may be needed. Surgery involves cutting the outer  side (lateral release) of the connecting band (retinaculum), with or without tightening the retinaculum on the inner side of the knee. Sometimes, surgery to cut the bony bump below the kneecap (tibial tubercle) and move it may be required (insertion of the patellar tendon into bone).   MEDICATION  If pain medicine is needed, nonsteroidal anti-inflammatory medicines (aspirin and ibuprofen), or other minor pain relievers (acetaminophen), are often advised.   Do not take pain medicine for 7 days before surgery.   Prescription pain relievers may be given, if your caregiver thinks they are needed. Use only as directed and only as much as you need.   Corticosteroid injections are rarely used for this condition.  HEAT AND COLD  Cold treatment (icing) should be applied for 10 to 15 minutes every 2 to 3 hours for inflammation and pain, and immediately after activity that aggravates your symptoms. Use ice packs or an ice massage.   Heat treatment may be used before performing stretching and strengthening activities prescribed by your caregiver, physical therapist, or athletic trainer. Use a heat pack or a warm water soak.  SEEK MEDICAL CARE IF:  Symptoms get worse or do not improve in 6 to 8 weeks, despite treatment.   Any of the following occur after surgery:   Pain, numbness, coldness, or discoloration (blue, gray, or dark color) in the foot.   Fever, increased pain, swelling, redness, drainage of fluids, or bleeding in the affected area.   New, unexplained symptoms develop. (Drugs used in treatment may produce side effects.)  EXERCISES   RANGE OF MOTION (ROM) AND STRETCHING EXERCISES - Excessive Lateral Patellar Compression Syndrome These exercises may help you when beginning to rehabilitate your injury. Your symptoms may resolve with or without further involvement from your physician, physical therapist or athletic trainer. While completing these exercises, remember:    Restoring tissue  flexibility helps normal motion to return to the joints. This allows healthier, less painful movement and activity.   An effective stretch should be held for at least 30 seconds.   A stretch should never be painful. You should only feel a gentle lengthening or release in the stretched tissue. Inform your caregiver of any exercises that increase your knee discomfort.  STRETCH - Lateral Patellar Mobilizations, Seated  While sitting, bend your knee 90 degrees or a little less. Place your foot flat on the floor.   Place the inside of your palm at the base of your thumb, on the inside border of your kneecap.   Press down on the inside border so that the outside border slightly lifts up.   You should feel a slight stretch on the outside edge of your kneecap. Hold this position for __________ seconds.  Repeat __________ times. Complete this stretch __________ times per day.   STRETCH - Hamstrings, Standing  Stand or sit and extend your right / left leg, placing your foot on a chair or foot stool.   Keep a slight arch in your low back and your hips straight forward.   Lead with your chest and lean forward at the waist until you feel a gentle stretch in the back of your  right / left knee or thigh. (When done correctly, this exercise requires leaning only a small distance.)   Hold this position for __________ seconds.  Repeat __________ times. Complete this stretch __________ times per day. STRETCH - Quadriceps, Prone  Lie on your stomach on a firm surface, such as a bed or padded floor.   Bend your right / left knee and grasp your ankle. If you are unable to reach your ankle or pant leg, use a belt around your foot to lengthen your reach.   Gently pull your heel toward your buttocks. Your knee should not slide out to the side. You should feel a stretch in the front of your thigh and knee.   Hold this position for __________ seconds.  Repeat __________ times. Complete this stretch __________  times per day.   STRETCH - Iliotibial Band  On the floor or bed, lie on your side so your right / left leg is on top. Bend your knee and grab your ankle.   Slowly bring your knee back so that your thigh is in line with your trunk. Keep your heel at your buttocks and gently arch your back so your head, shoulders and hips line up.   Slowly lower your leg so that your knee approaches the floor, until you feel a gentle stretch on the outside of your right / left thigh. If you do not feel a stretch and your knee will not fall farther, place the heel of your opposite foot on top of your knee and pull your thigh down farther.   Hold this stretch for __________ seconds.  Repeat __________ times. Complete this stretch __________ times per day. STRENGTHENING EXERCISES - Excessive Lateral Patellar Compression Syndrome These exercises may help you when beginning to rehabilitate your injury. They may resolve your symptoms with or without further involvement from your physician, physical therapist or athletic trainer. While completing these exercises, remember:    Muscles can gain both the endurance and the strength needed for everyday activities through controlled exercises.   Complete these exercises as instructed by your physician, physical therapist or athletic trainer. Increase the resistance and repetitions only as guided.   Only do your exercises in a pain-free range of motion. If the exercises that involve bending your knees while bearing weight cause pain, stop them and consult your caregiver.  STRENGTH - Quadriceps, Isometrics  Lie on your back with your right / left leg extended and your opposite knee bent.   Gradually tense the muscles in the front of your right / left thigh. You should see either your knee cap slide up toward your hip or increased dimpling just above the knee. This motion will push the back of the knee down toward the floor, mat, or bed on which you are lying.   Hold the  muscle as tight as you can, without increasing your pain, for __________ seconds.   Relax the muscles slowly and completely between each repetition.  Repeat __________ times. Complete this exercise __________ times per day.   STRENGTH - Quadriceps, Short Arcs  Lie on your back. Place a __________ inch towel roll under your right / left knee, so that the knee bends slightly.   Raise only your lower leg, by tightening the muscles in the front of your thigh. Do not allow your thigh to rise.   Hold this position for __________ seconds.  Repeat __________ times. Complete this exercise __________ times per day.   OPTIONAL ANKLE WEIGHTS: Begin with ____________________,  but DO NOT exceed ____________________. Increase in 1 pound/0.5 kilogram increments. STRENGTH - Quadriceps, Straight Leg Raises Quality counts! Watch for signs that the quadriceps muscle is working, to be sure you are strengthening the correct muscles and not "cheating" by substituting with healthier muscles.  Lay on your back with your right / left leg extended and your opposite knee bent.   Tense the muscles in the front of your right / left thigh. You should see either your knee cap slide up or increased dimpling just above the knee. Your thigh may even shake a bit.   Tighten these muscles even more and raise your leg 4 to 6 inches off the floor. Hold for __________ seconds.   Keeping these muscles tense, lower your leg.   Relax the muscles slowly and completely in between each repetition.  Repeat __________ times. Complete this exercise __________ times per day.   STRENGTH - Quadriceps, Wall Slides Follow guidelines for form closely. Increased knee pain often results from poorly placed feet or knees.  Lean against a smooth wall or door, and walk your feet out 18-24 inches. Place your feet hip width apart.   Slowly slide down the wall or door until your knees bend __________ degrees.* Keep your knees over your heels, not  your toes, and in line with your hips, not falling to either side.   Hold for __________ seconds. Stand up to rest for __________ seconds between each repetition.  Repeat __________ times. Complete this exercise __________ times per day. * Your physician, physical therapist or athletic trainer will alter this angle based on your symptoms and progress. STRENGTH - Quadriceps, Squats  Stand in a door frame, so that your feet and knees are in line with the frame.   Use your hands for balance, not support, on the frame.   Slowly lower your weight, bending at the hips and knees. Keep your lower legs upright so that they are parallel with the door frame. Squat only within the range that does not increase your knee pain. Never let your hips drop below your knees.   Slowly return upright, pushing with your legs, not pulling with your hands.  Repeat __________ times. Complete this exercise __________ times per day.   STRENGTH - Quadriceps, Step-Ups   Use a thick book, step or step stool that is __________ inches tall.   Hold a wall or counter for balance only, not support.   Slowly, step up with your right / left foot, keeping your knee in line with your hip and foot. Do not allow your knee to bend so far that you cannot see your toes.   Slowly unlock your knee and lower yourself to the starting position. Your muscles, not gravity, should lower you.  Repeat __________ times. Complete this exercise __________ times per day.   STRENGTH - Quadriceps, Step-Downs  Stand on the edge of a step stool or stair. Be prepared to use a countertop or wall for balance, if needed.   Keeping your right / left knee directly over the middle of your foot, slowly touch your opposite heel to the floor or lower step. Do not go all the way to the floor if your knee pain increases. Just go as far as you can without increased discomfort. Use your right / left leg muscles, not gravity, to lower your body weight.   Slowly  push your body weight back up to the starting position,  Repeat __________ times. Complete this exercise __________ times per  day.   STRENGTH - Quad/VMO, Isometric  Sit in a chair with your right / left knee slightly bent. With your fingertips, feel the VMO muscle, just above the inside of your knee. The VMO is important in controlling the position of your kneecap.   Keep your fingertips on this muscle. Without actually moving your leg, attempt to drive your knee down, as if straightening your leg. You should feel your VMO tense. If you have a difficult time, you may wish to try the same exercise on your healthy knee first.   Tense this muscle as hard as you can without increasing any knee pain.   Hold for __________ seconds. Relax the muscles slowly and completely between each repetition.  Repeat __________ times. Complete exercise __________ times per day.   Document Released: 04/15/2005 Document Revised: 04/04/2011 Document Reviewed: 07/28/2008 [facility] Patient Information 2012 [place], [place].

## 2011-08-03 ENCOUNTER — Emergency Department (HOSPITAL_COMMUNITY)
Admission: EM | Admit: 2011-08-03 | Discharge: 2011-08-03 | Disposition: A | Discharge status: Home / Self Care | Payer: Medicaid Other | Attending: Emergency Medicine | Admitting: Emergency Medicine

## 2011-08-03 ENCOUNTER — Encounter (HOSPITAL_COMMUNITY): Payer: Self-pay | Admitting: Physical Medicine and Rehabilitation

## 2011-08-03 DIAGNOSIS — M25519 Pain in unspecified shoulder: Secondary | ICD-10-CM | POA: Insufficient documentation

## 2011-08-03 DIAGNOSIS — I10 Essential (primary) hypertension: Secondary | ICD-10-CM | POA: Insufficient documentation

## 2011-08-03 HISTORY — DX: Essential (primary) hypertension: I10

## 2011-08-03 HISTORY — DX: Bipolar disorder, unspecified: F31.9

## 2011-08-03 MED ORDER — OXYCODONE-ACETAMINOPHEN 5-325 MG PO TABS
1.0000 | ORAL_TABLET | Freq: Once | ORAL | Status: AC
  Administered 2011-08-03: 1 via ORAL
  Filled 2011-08-03: qty 1

## 2011-08-03 NOTE — ED Notes (Signed)
Pt states understanding of discharge instructions 

## 2011-08-03 NOTE — ED Provider Notes (Signed)
History     CSN: [id number]  Arrival date & time 08/03/11  1718   First MD Initiated Contact with Patient 08/03/11 1847      Chief Complaint  Patient presents with  . Shoulder Pain    (Consider location/radiation/quality/duration/timing/severity/associated sxs/prior treatment) HPI Comments: Patient with left shoulder pain that she reports is acute on chronic.  She states she previous shoulder separation 1-2 years ago and yesterday her shoulder began hurting again.  No new injuries.  No fevers.  No redness or crepitus.  Patient is a 31 y.o. female presenting with shoulder pain. The history is provided by the patient. No language interpreter was used.  Shoulder Pain This is a recurrent problem. The current episode started yesterday. The problem occurs constantly. The problem has been gradually worsening. Pertinent negatives include no chest pain, no abdominal pain, no headaches and no shortness of breath. The symptoms are aggravated by nothing. The symptoms are relieved by nothing. She has tried nothing for the symptoms.    Past Medical History  Diagnosis Date  . Asthma   . Hypertension   . Bipolar 1 disorder     Past Surgical History  Procedure Date  . Cholecystectomy     History reviewed. No pertinent family history.  History  Substance Use Topics  . Smoking status: Current Everyday Smoker -- 0.5 packs/day    Types: Cigarettes  . Smokeless tobacco: Not on file   Comment: decreased smoking  . Alcohol Use: Yes     rare    OB History    Grav Para Term Preterm Abortions TAB SAB Ect Mult Living                  Review of Systems  Constitutional: Negative.  Negative for fever and chills.  HENT: Negative.   Eyes: Negative.  Negative for discharge and redness.  Respiratory: Negative.  Negative for cough and shortness of breath.   Cardiovascular: Negative.  Negative for chest pain.  Gastrointestinal: Negative.  Negative for nausea, vomiting, abdominal pain and  diarrhea.  Genitourinary: Negative.  Negative for dysuria and vaginal discharge.  Musculoskeletal: Positive for arthralgias. Negative for back pain.  Skin: Negative.  Negative for color change and rash.  Neurological: Negative.  Negative for syncope and headaches.  Hematological: Negative.  Negative for adenopathy.  Psychiatric/Behavioral: Negative.  Negative for confusion.  All other systems reviewed and are negative.    Allergies  Review of patient's allergies indicates no known allergies.  Home Medications   Current Outpatient Rx  Name Route Sig Dispense Refill  . ALBUTEROL SULFATE HFA 108 (90 BASE) MCG/ACT IN AERS Inhalation Inhale 2 puffs into the lungs every 6 (six) hours as needed. For shortness of breath. 1 Inhaler 3  . CYCLOBENZAPRINE HCL 10 MG PO TABS Oral Take 10 mg by mouth 3 (three) times daily as needed. For spasms    . FLUOXETINE HCL 40 MG PO CAPS Oral Take 1 capsule (40 mg total) by mouth daily. 30 capsule 2  . HYDROCHLOROTHIAZIDE 25 MG PO TABS Oral Take 1 tablet (25 mg total) by mouth daily. 30 tablet 1  . NAPROXEN 500 MG PO TABS Oral Take 1 tablet (500 mg total) by mouth 2 (two) times daily with a meal. 60 tablet 0  . OLANZAPINE 10 MG PO TABS Oral Take 1 tablet (10 mg total) by mouth at bedtime. 30 tablet 3    BP 149/82  Pulse 107  Temp(Src) 98.7 F (37.1 C) (Oral)  Resp 18  SpO2 97%  Physical Exam  Nursing note and vitals reviewed. Constitutional: She is oriented to person, place, and time. She appears well-developed and well-nourished.  Non-toxic appearance. She does not have a sickly appearance.  HENT:  Head: Normocephalic and atraumatic.  Eyes: Conjunctivae, EOM and lids are normal. Pupils are equal, round, and reactive to light. No scleral icterus.  Neck: Trachea normal and normal range of motion. Neck supple.  Cardiovascular: Normal rate, regular rhythm and normal heart sounds.   Pulmonary/Chest: Effort normal and breath sounds normal. No respiratory  distress. She has no wheezes. She has no rales.  Abdominal: Soft. Normal appearance. There is no tenderness. There is no rebound, no guarding and no CVA tenderness.  Musculoskeletal: Normal range of motion. She exhibits no edema and no tenderness.  Neurological: She is alert and oriented to person, place, and time. She has normal strength.  Skin: Skin is warm, dry and intact. No rash noted.  Psychiatric: She has a normal mood and affect. Her behavior is normal. Judgment and thought content normal.    ED Course  Procedures (including critical care time)  Labs Reviewed - No data to display No results found.   No diagnosis found.    MDM  Patient with acute on chronic shoulder pain.  I've advised her that I will treat her pain here but I will not write her for chronic pain medicine prescriptions and she has a primary care physician whom she's been seeing regularly and has been prescribed pain medications from them.  On review of the controlled substance database patient did receive 30 hydrocodone on March 27.  She also received 60 oxycodone on March 14.  She received another 20 hydrocodone on March 8 and 30 oxycodone on February 21.  Patient does not appear to have acute signs of septic arthritis or other joint injury this time she has no new injury today.  She is followup with her family practice physician later this week        Nat Christen, MD 08/03/11 [phone]

## 2011-08-03 NOTE — ED Notes (Signed)
Pt presents to department for evaluation of chronic L shoulder pain. Pt states she is unable to see PCP at the time and pain has become worse. Onset yesterday. 10/10 throbbing pain upon arrival to ED. Pain becomes worse with movement. No recent injury to area. She is alert and oriented x4.

## 2011-08-03 NOTE — Discharge Instructions (Signed)
Shoulder Pain The shoulder is a ball and socket joint. The muscles and tendons (rotator cuff) are what keep the shoulder in its joint and stable. This collection of muscles and tendons holds in the head (ball) of the humerus (upper arm bone) in the fossa (cup) of the scapula (shoulder blade). Today no reason was found for your shoulder pain. Often pain in the shoulder may be treated conservatively with temporary immobilization. For example, holding the shoulder in one place using a sling for rest. Physical therapy may be needed if problems continue. HOME CARE INSTRUCTIONS   Apply ice to the sore area for 15 to 20 minutes, 3 to 4 times per day for the first 2 days. Put the ice in a plastic bag. Place a towel between the bag of ice and your skin.   If you have or were given a shoulder sling and straps, do not remove for as long as directed by your caregiver or until you see a caregiver for a follow-up examination. If you need to remove it to shower or bathe, move your arm as little as possible.   Sleep on several pillows at night to lessen swelling and pain.   Only take over-the-counter or prescription medicines for pain, discomfort, or fever as directed by your caregiver.   Keep any follow-up appointments in order to avoid any type of permanent shoulder disability or chronic pain problems.  SEEK MEDICAL CARE IF:   Pain in your shoulder increases or new pain develops in your arm, hand, or fingers.   Your hand or fingers are colder than your other hand.   You do not obtain pain relief with the medications or your pain becomes worse.  SEEK IMMEDIATE MEDICAL CARE IF:   Your arm, hand, or fingers are numb or tingling.   Your arm, hand, or fingers are swollen, painful, or turn white or blue.   You develop chest pain or shortness of breath.  MAKE SURE YOU:   Understand these instructions.   Will watch your condition.   Will get help right away if you are not doing well or get worse.    Document Released: 01/23/2005 Document Revised: 04/04/2011 Document Reviewed: 03/30/2011 [employer] Patient Information 2012 [employer]. 

## 2011-08-06 ENCOUNTER — Ambulatory Visit (INDEPENDENT_AMBULATORY_CARE_PROVIDER_SITE_OTHER): Payer: Medicaid Other | Admitting: Family Medicine

## 2011-08-06 ENCOUNTER — Ambulatory Visit
Admission: RE | Admit: 2011-08-06 | Discharge: 2011-08-06 | Disposition: A | Discharge status: Home / Self Care | Payer: Medicaid Other | Source: Ambulatory Visit | Attending: Family Medicine | Admitting: Family Medicine

## 2011-08-06 ENCOUNTER — Encounter: Payer: Self-pay | Admitting: Family Medicine

## 2011-08-06 VITALS — BP 136/80 | Temp 98.0°F | Ht 64.0 in | Wt 186.0 lb

## 2011-08-06 DIAGNOSIS — M25519 Pain in unspecified shoulder: Secondary | ICD-10-CM

## 2011-08-06 DIAGNOSIS — M25512 Pain in left shoulder: Secondary | ICD-10-CM

## 2011-08-06 DIAGNOSIS — F319 Bipolar disorder, unspecified: Secondary | ICD-10-CM

## 2011-08-06 MED ORDER — LAMOTRIGINE 25 MG PO TABS: 25.0000 mg | ORAL_TABLET | Freq: Every day | ORAL | Status: DC

## 2011-08-06 MED ORDER — OXYCODONE-ACETAMINOPHEN 5-325 MG PO TABS: 1.0000 | ORAL_TABLET | Freq: Three times a day (TID) | ORAL | Status: AC | PRN

## 2011-08-06 NOTE — Patient Instructions (Signed)
Thank you for coming in today. Please start the lamictal daily.  Let me know if you develop an unexpected rash.  Call if you do.  Get that xray today.  I will call tonight or tomorrow.  See me in 2 weeks.  Let me know if they feel like hurting your self or others.

## 2011-08-07 ENCOUNTER — Encounter: Payer: Self-pay | Admitting: Family Medicine

## 2011-08-07 NOTE — Progress Notes (Signed)
Stacey Wong is a 31 y.o. female who presents to [facility] today for   1) bipolar followup: Taking fluoxetine and olanzapine daily.  Currently is feeling down. Her father who is an alcoholic and also likely has bipolar disorder and has been calling her a lot recently and been verbally abusive. Additionally she notes that she is in more pain than usual and this is worsening her mood. She notes some passive death wish but denies any active plan. Her daughter keeps her safe she contracts verbally for safety today.  2)    PMH, SH reviewed: ROS as above otherwise neg. No Chest pain, palpitations, SOB, Fever, Chills, Abd pain, N/V/D.  Medications reviewed. Current Outpatient Prescriptions  Medication Sig Dispense Refill  . albuterol (PROVENTIL HFA;VENTOLIN HFA) 108 (90 BASE) MCG/ACT inhaler Inhale 2 puffs into the lungs every 6 (six) hours as needed. For shortness of breath.  1 Inhaler  3  . cyclobenzaprine (FLEXERIL) 10 MG tablet Take 10 mg by mouth 3 (three) times daily as needed. For spasms      . FLUoxetine (PROZAC) 40 MG capsule Take 1 capsule (40 mg total) by mouth daily.  30 capsule  2  . hydrochlorothiazide (HYDRODIURIL) 25 MG tablet Take 1 tablet (25 mg total) by mouth daily.  30 tablet  1  . naproxen (NAPROSYN) 500 MG tablet Take 1 tablet (500 mg total) by mouth 2 (two) times daily with a meal.  60 tablet  0  . OLANZapine (ZYPREXA) 10 MG tablet Take 1 tablet (10 mg total) by mouth at bedtime.  30 tablet  3  . oxyCODONE-acetaminophen (ROXICET) 5-325 MG per tablet Take 1 tablet by mouth every 8 (eight) hours as needed for pain.  40 tablet  0  . lamoTRIgine (LAMICTAL) 25 MG tablet Take 1 tablet (25 mg total) by mouth daily.  30 tablet  1    Exam:  BP 136/80  Temp(Src) 98 F (36.7 C) (Oral)  Ht 5\' 4"  (1.626 m)  Wt 186 lb (84.369 kg)  BMI 31.93 kg/m2 Gen: Well NAD HEENT: EOMI,  MMM Lungs: CTABL Nl WOB Heart: RRR no MRG Abd: NABS, NT, ND Exts: Non edematous BL  LE, warm and well perfused.     No results found for this or any previous visit (from the past 72 hour(s)).

## 2011-08-13 ENCOUNTER — Other Ambulatory Visit: Payer: Self-pay | Admitting: Family Medicine

## 2011-08-13 ENCOUNTER — Ambulatory Visit: Payer: Medicaid Other | Attending: Family Medicine | Admitting: Rehabilitation

## 2011-08-16 ENCOUNTER — Ambulatory Visit: Payer: Medicaid Other | Admitting: Family Medicine

## 2011-08-21 ENCOUNTER — Ambulatory Visit (INDEPENDENT_AMBULATORY_CARE_PROVIDER_SITE_OTHER): Payer: Medicaid Other | Admitting: Family Medicine

## 2011-08-21 ENCOUNTER — Encounter: Payer: Self-pay | Admitting: Family Medicine

## 2011-08-21 VITALS — BP 133/85 | HR 93 | Ht 64.0 in | Wt 183.0 lb

## 2011-08-21 DIAGNOSIS — M25559 Pain in unspecified hip: Secondary | ICD-10-CM

## 2011-08-21 DIAGNOSIS — M25512 Pain in left shoulder: Secondary | ICD-10-CM

## 2011-08-21 DIAGNOSIS — M25519 Pain in unspecified shoulder: Secondary | ICD-10-CM

## 2011-08-21 DIAGNOSIS — F319 Bipolar disorder, unspecified: Secondary | ICD-10-CM

## 2011-08-21 DIAGNOSIS — M25551 Pain in right hip: Secondary | ICD-10-CM

## 2011-08-21 DIAGNOSIS — F17200 Nicotine dependence, unspecified, uncomplicated: Secondary | ICD-10-CM

## 2011-08-21 MED ORDER — CYCLOBENZAPRINE HCL 10 MG PO TABS: 10.0000 mg | ORAL_TABLET | Freq: Three times a day (TID) | ORAL | Status: DC | PRN

## 2011-08-21 MED ORDER — LAMOTRIGINE 25 MG PO TABS: 50.0000 mg | ORAL_TABLET | Freq: Every day | ORAL | Status: DC

## 2011-08-21 NOTE — Assessment & Plan Note (Signed)
Doing well on olanzapine, fluoxetine, and Lamictal. Lamictal currently at 25 mg daily. Plan to increase to 50 mg daily and followup in 2-3 weeks. Discussed warning signs such as worsening mood, suicidality and homicidality, and unexpected rashes. She expresses understanding.

## 2011-08-21 NOTE — Progress Notes (Signed)
Stacey Wong is a 31 y.o. female who presents to [facility] today for   1) bipolar disorder: Patient notes slightly improved mood.  She continues to have troubles with her father who has his own mental health issues.  Additionally she is trying to accommodate her pain.  However she notes that she is less irritable than previously.  She denies any feelings of hopelessness or homicidality or suicidality.  Additionally she denies any rashes.  She is reasonably happy with how things are going.  2) left shoulder pain: Much improved since the last visit.  She continues to note pain at the a.c. joint especially with certain motions.  However she feels overall well and denies any arm range of motion difficulty or weakness.  3) right hip pain: Continues to experience chronic right hip pain. She has been seen by her orthopedic surgeon who recommends replacement surgery.  She is very anxious about this surgery and wonders if there's anything she can do to get her body ready for surgery.  She would like to have the surgery after her birthday in September.  She is not currently walking much or participating in any exercise activities.  4) smoking: Patient is currently smoking. She identified smoking is something she can work on to improve her body for surgery.    PMH, SH reviewed: Significant for bipolar disorder and smoking ROS as above no fevers chills chest pain abdominal pain nausea vomiting or diarrhea Medications reviewed. Current Outpatient Prescriptions  Medication Sig Dispense Refill  . albuterol (PROVENTIL HFA;VENTOLIN HFA) 108 (90 BASE) MCG/ACT inhaler Inhale 2 puffs into the lungs every 6 (six) hours as needed. For shortness of breath.  1 Inhaler  3  . cyclobenzaprine (FLEXERIL) 10 MG tablet Take 1 tablet (10 mg total) by mouth 3 (three) times daily as needed. For spasms  60 tablet  3  . FLUoxetine (PROZAC) 40 MG capsule Take 1 capsule (40 mg total) by mouth daily.  30 capsule  2  . hydrochlorothiazide  (HYDRODIURIL) 25 MG tablet TAKE 1 TABLET BY MOUTH DAILY  30 tablet  3  . lamoTRIgine (LAMICTAL) 25 MG tablet Take 2 tablets (50 mg total) by mouth daily.  60 tablet  1  . OLANZapine (ZYPREXA) 10 MG tablet Take 1 tablet (10 mg total) by mouth at bedtime.  30 tablet  3  . DISCONTD: cyclobenzaprine (FLEXERIL) 10 MG tablet Take 10 mg by mouth 3 (three) times daily as needed. For spasms      . DISCONTD: lamoTRIgine (LAMICTAL) 25 MG tablet Take 1 tablet (25 mg total) by mouth daily.  30 tablet  1  . cyclobenzaprine (FLEXERIL) 10 MG tablet Take 1 tablet (10 mg total) by mouth 2 (two) times daily as needed for muscle spasms.  20 tablet  0  . cyclobenzaprine (FLEXERIL) 10 MG tablet Take 1 tablet (10 mg total) by mouth 3 (three) times daily as needed for muscle spasms.  30 tablet  0    Exam:  BP 133/85  Pulse 93  Ht 5\' 4"  (1.626 m)  Wt 183 lb (83.008 kg)  BMI 31.41 kg/m2 Gen: Well NAD HEENT: EOMI,  MMM Lungs: CTABL Nl WOB Heart: RRR no MRG Abd: NABS, NT, ND Exts: Non edematous BL  LE, warm and well perfused. Decreased right hip range of motion Left shoulder:  Increased laxity of the left AC joint. Normal shoulder range of motion and strength Psych: Normal eye contact.  Slightly tearful at times but overall normal mood.  Normal  affect. Normal speech and thought process. No SI/HI.  No results found for this or any previous visit (from the past 72 hour(s)).

## 2011-08-21 NOTE — Patient Instructions (Signed)
Thank you for coming in today. Take 2 lamictal (50mg ) a day.  Try to stay active. Consider walking or water aerobics.  Come back in 2-3 weeks.  Let me know if you need refills of your blood pressure of psych meds.  Let me know about any unexpected rashes.

## 2011-08-21 NOTE — Assessment & Plan Note (Signed)
Seems to be doing well currently. We'll continue to follow and encourage physical therapy

## 2011-08-21 NOTE — Assessment & Plan Note (Signed)
Doing reasonably well however not a lot of improvement after fluoroscopic guided injection. Orthopedic surgeon has recommended joint replacement.  She would like for this to happen after September. In the meantime we'll continue to work with physical therapy for limb strengthening and work on walking and doing water aerobics. Followup in around one month.

## 2011-08-21 NOTE — Assessment & Plan Note (Signed)
Currently abstinent.

## 2011-08-21 NOTE — Assessment & Plan Note (Signed)
Continue to work on tobacco cessation. Patient agrees to start tobacco cessation after she gets her mood more stable. Goal after July

## 2011-08-30 ENCOUNTER — Telehealth: Payer: Self-pay | Admitting: Family Medicine

## 2011-08-30 DIAGNOSIS — F319 Bipolar disorder, unspecified: Secondary | ICD-10-CM

## 2011-08-30 MED ORDER — LAMOTRIGINE 25 MG PO TABS: 50.0000 mg | ORAL_TABLET | Freq: Every day | ORAL | Status: DC

## 2011-08-30 MED ORDER — NORTRIPTYLINE HCL 25 MG PO CAPS: 25.0000 mg | ORAL_CAPSULE | Freq: Every day | ORAL | Status: DC

## 2011-09-04 NOTE — Telephone Encounter (Signed)
Pt notes pain especially at night.  Plan to add nortriptyline and follow up in a few weeks.

## 2011-09-06 ENCOUNTER — Encounter: Payer: Self-pay | Admitting: Family Medicine

## 2011-09-06 ENCOUNTER — Ambulatory Visit (INDEPENDENT_AMBULATORY_CARE_PROVIDER_SITE_OTHER): Payer: Medicaid Other | Admitting: Family Medicine

## 2011-09-06 VITALS — BP 134/84 | HR 98 | Ht 64.0 in | Wt 187.0 lb

## 2011-09-06 DIAGNOSIS — F319 Bipolar disorder, unspecified: Secondary | ICD-10-CM

## 2011-09-06 DIAGNOSIS — M25559 Pain in unspecified hip: Secondary | ICD-10-CM

## 2011-09-06 DIAGNOSIS — M25551 Pain in right hip: Secondary | ICD-10-CM

## 2011-09-06 DIAGNOSIS — M25519 Pain in unspecified shoulder: Secondary | ICD-10-CM

## 2011-09-06 DIAGNOSIS — F17200 Nicotine dependence, unspecified, uncomplicated: Secondary | ICD-10-CM

## 2011-09-06 DIAGNOSIS — M25512 Pain in left shoulder: Secondary | ICD-10-CM

## 2011-09-06 MED ORDER — OLANZAPINE 10 MG PO TABS: 10.0000 mg | ORAL_TABLET | Freq: Every day | ORAL | Status: DC

## 2011-09-06 MED ORDER — LAMOTRIGINE 100 MG PO TABS: 100.0000 mg | ORAL_TABLET | Freq: Every day | ORAL | Status: DC

## 2011-09-06 MED ORDER — HYDROCODONE-ACETAMINOPHEN 5-500 MG PO TABS: 1.0000 | ORAL_TABLET | Freq: Three times a day (TID) | ORAL | Status: DC | PRN

## 2011-09-06 NOTE — Patient Instructions (Signed)
Thank you for coming in today. For hip pain: 1) please try to walk everyday 2) call the [facility] and try to enroll in water aerobics. 3) take hydrocodone one tablet a day as needed for pain.   For bipolar: Increase Lamictal to 100 mg daily. Consider [facility] to schedule to counseling. I think you may benefit from this. If you feel like hurting herself or others please call me immediately or go to the emergency room or call 911  We will consider stopping smoking when you're bipolar disorder gets a little more stable.  Cutting back to 5 cigarettes a day  is a good way to get ready to quit.  You can do it, and we will be happy to help you every step of the way.

## 2011-09-06 NOTE — Assessment & Plan Note (Signed)
Currently doing reasonably well with olanzapine fluoxetine and Lamictal.  Still having significant depressed mood. Plan to increase Lamictal to  100 mg daily and followup in 2-4 weeks.  Contract for safety regarding passive occasional death wish.  Patient expresses understanding. Suicidal precautions reviewed. Please see discharge instructions.

## 2011-09-06 NOTE — Assessment & Plan Note (Signed)
Spontaneously resolved.

## 2011-09-06 NOTE — Progress Notes (Signed)
Stacey Wong is a 31 y.o. female who presents to [facility] today for   1) hip pain: Patient notes continued right hip pain. She is trying to walk more but that has caused worsening hip pain. She has a joint replacement surgery scheduled for sometime in September.  She would like to remain active. Ibuprofen and tramadol have not worked very well. She requests hydrocodone.  2) bipolar: Patient is currently taking medications listed below. Lamictal 50 mg currently for about one month. She notes reduced mania in her ability but continued depressive mood. She feels down and worthless. She denies any active suicidality but does note occasional passive death wish. She agrees for contract for safety today. She feels better overall than she used to however.  3) shoulder pain: Left shoulder pain resolved spontaneously. Nortriptyline just put her to sleep. She feels well otherwise.  4) smoking: Patient is currently smoking but would like to quit. She is very anxious about that would affect her mood   PMH: Reviewed significant for bipolar disorder, hypertension, smoking History  Substance Use Topics  . Smoking status: Current Everyday Smoker -- 0.5 packs/day    Types: Cigarettes  . Smokeless tobacco: Not on file   Comment: decreased smoking  . Alcohol Use: Yes     rare   ROS as above  Medications reviewed. Current Outpatient Prescriptions  Medication Sig Dispense Refill  . albuterol (PROVENTIL HFA;VENTOLIN HFA) 108 (90 BASE) MCG/ACT inhaler Inhale 2 puffs into the lungs every 6 (six) hours as needed. For shortness of breath.  1 Inhaler  3  . cyclobenzaprine (FLEXERIL) 10 MG tablet Take 1 tablet (10 mg total) by mouth 3 (three) times daily as needed. For spasms  60 tablet  3  . FLUoxetine (PROZAC) 40 MG capsule Take 1 capsule (40 mg total) by mouth daily.  30 capsule  2  . hydrochlorothiazide (HYDRODIURIL) 25 MG tablet TAKE 1 TABLET BY MOUTH DAILY  30 tablet  3  . lamoTRIgine (LAMICTAL) 100 MG tablet Take  1 tablet (100 mg total) by mouth daily.  30 tablet  1  . OLANZapine (ZYPREXA) 10 MG tablet Take 1 tablet (10 mg total) by mouth at bedtime.  30 tablet  3  . DISCONTD: lamoTRIgine (LAMICTAL) 25 MG tablet Take 2 tablets (50 mg total) by mouth daily.  60 tablet  1  . cyclobenzaprine (FLEXERIL) 10 MG tablet Take 1 tablet (10 mg total) by mouth 2 (two) times daily as needed for muscle spasms.  20 tablet  0  . cyclobenzaprine (FLEXERIL) 10 MG tablet Take 1 tablet (10 mg total) by mouth 3 (three) times daily as needed for muscle spasms.  30 tablet  0  . HYDROcodone-acetaminophen (VICODIN) 5-500 MG per tablet Take 1 tablet by mouth every 8 (eight) hours as needed for pain.  45 tablet  0  . nortriptyline (PAMELOR) 25 MG capsule Take 1 capsule (25 mg total) by mouth at bedtime.  30 capsule  0  . DISCONTD: OLANZapine (ZYPREXA) 10 MG tablet Take 1 tablet (10 mg total) by mouth at bedtime.  30 tablet  3    Exam:  BP 134/84  Pulse 98  Ht 5\' 4"  (1.626 m)  Wt 187 lb (84.823 kg)  BMI 32.10 kg/m2 Gen: Well NAD HEENT: EOMI,  MMM Lungs: CTABL Nl WOB Heart: RRR no MRG Abd: NABS, NT, ND Exts: Non edematous BL  LE, warm and well perfused.  Musculoskeletal: Right hip mildly tender with range of motion. Psych: Alert and  oriented. Normal speech and affect. Thought process is normal linear and goal-directed. No active suicidality or homicidality  No results found for this or any previous visit (from the past 72 hour(s)).

## 2011-09-06 NOTE — Assessment & Plan Note (Signed)
Plan to work on tobacco cessation one bipolar disorder more stable

## 2011-09-06 NOTE — Assessment & Plan Note (Signed)
Patient has continued pain despite guided hip injection. Plan to provide low-dose hydrocodone until she can get her hip replacement.  Agreed to pain management contract. Plan for 45 tablets of Vicodin a month with a titration over the next month.  Plan to followup in 2-4 weeks

## 2011-09-27 ENCOUNTER — Ambulatory Visit (INDEPENDENT_AMBULATORY_CARE_PROVIDER_SITE_OTHER): Payer: Medicaid Other | Admitting: Family Medicine

## 2011-09-27 ENCOUNTER — Encounter: Payer: Self-pay | Admitting: Family Medicine

## 2011-09-27 VITALS — BP 119/86 | HR 82 | Temp 98.7°F | Ht 64.0 in | Wt 180.1 lb

## 2011-09-27 DIAGNOSIS — M25559 Pain in unspecified hip: Secondary | ICD-10-CM

## 2011-09-27 DIAGNOSIS — M25551 Pain in right hip: Secondary | ICD-10-CM

## 2011-09-27 DIAGNOSIS — F17200 Nicotine dependence, unspecified, uncomplicated: Secondary | ICD-10-CM

## 2011-09-27 DIAGNOSIS — F319 Bipolar disorder, unspecified: Secondary | ICD-10-CM

## 2011-09-27 MED ORDER — HYDROCODONE-ACETAMINOPHEN 5-500 MG PO TABS: 1.0000 | ORAL_TABLET | Freq: Two times a day (BID) | ORAL | Status: DC | PRN

## 2011-09-27 NOTE — Assessment & Plan Note (Signed)
Currently doing reasonably well on Vicodin. Plan to continue with 60 tablets per month. Consider increasing next month if pain is insufficiently controlled

## 2011-09-27 NOTE — Assessment & Plan Note (Signed)
Currently doing well.  No plans to change. Followup with new Dr. and one month

## 2011-09-27 NOTE — Assessment & Plan Note (Signed)
Plan to followup with pharmacy in a few weeks

## 2011-09-27 NOTE — Progress Notes (Signed)
Stacey Wong is a 31 y.o. female who presents to [facility] today for   1) mood: Bipolar disorder is currently doing reasonably well.  She is no longer having significant prolonged anxiety and is much less irritable.  She knows that she had one small manageable panic attack in the interim.  She is currently taking medications listed below.  She feels well. No SI/HI hallucinations or delusions.   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  Plan for surgical replacement in the fall of 2013. In the interim currently treating with Vicodin.  Her pain is reasonably well-controlled. She says that whenever she has to take 2 Vicodin pills a day.  She is currently out of Vicodin.  She denies any itching constipation, or nausea.  Pain medications allowed her to be more active and ambulate.  3) smoking: Currently smoking would like to quit.  She is unsure what to do  PMH: Reviewed significant for bipolar and chronic pain History  Substance Use Topics  . Smoking status: Current Everyday Smoker -- 0.5 packs/day    Types: Cigarettes  . Smokeless tobacco: Not on file   Comment: decreased smoking  . Alcohol Use: Yes     rare   ROS as above  Medications reviewed. Current Outpatient Prescriptions  Medication Sig Dispense Refill  . albuterol (PROVENTIL HFA;VENTOLIN HFA) 108 (90 BASE) MCG/ACT inhaler Inhale 2 puffs into the lungs every 6 (six) hours as needed. For shortness of breath.  1 Inhaler  3  . cyclobenzaprine (FLEXERIL) 10 MG tablet Take 1 tablet (10 mg total) by mouth 3 (three) times daily as needed. For spasms  60 tablet  3  . FLUoxetine (PROZAC) 40 MG capsule Take 1 capsule (40 mg total) by mouth daily.  30 capsule  2  . hydrochlorothiazide (HYDRODIURIL) 25 MG tablet TAKE 1 TABLET BY MOUTH DAILY  30 tablet  3  . HYDROcodone-acetaminophen (VICODIN) 5-500 MG per tablet Take 1 tablet by mouth 2 (two) times daily as needed for pain.  60 tablet  0  . lamoTRIgine  (LAMICTAL) 100 MG tablet Take 1 tablet (100 mg total) by mouth daily.  30 tablet  1  . OLANZapine (ZYPREXA) 10 MG tablet Take 1 tablet (10 mg total) by mouth at bedtime.  30 tablet  3  . DISCONTD: HYDROcodone-acetaminophen (VICODIN) 5-500 MG per tablet       . HYDROcodone-acetaminophen (VICODIN) 5-500 MG per tablet Take 1 tablet by mouth every 8 (eight) hours as needed for pain.  20 tablet  0  . HYDROcodone-acetaminophen (VICODIN) 5-500 MG per tablet Take 1 tablet by mouth every 8 (eight) hours as needed for pain.  45 tablet  0  . DISCONTD: cyclobenzaprine (FLEXERIL) 10 MG tablet Take 1 tablet (10 mg total) by mouth 2 (two) times daily as needed for muscle spasms.  20 tablet  0  . DISCONTD: cyclobenzaprine (FLEXERIL) 10 MG tablet Take 1 tablet (10 mg total) by mouth 3 (three) times daily as needed for muscle spasms.  30 tablet  0    Exam:  BP 119/86  Pulse 82  Temp(Src) 98.7 F (37.1 C) (Oral)  Ht 5\' 4"  (1.626 m)  Wt 180 lb 1.6 oz (81.693 kg)  BMI 30.91 kg/m2  LMP 07/28/2011 Gen: Well NAD HEENT: EOMI,  MMM Lungs: CTABL Nl WOB Heart: RRR no MRG Abd: NABS, NT, ND Exts: Non edematous BL  LE, warm and well perfused.  Right hip: Pain with range of  motion limited external and internal range of motion.  Psych: Alert and oriented normal affect and eye contact. Speech is normal linear and goal-directed.   No results found for this or any previous visit (from the past 72 hour(s)).

## 2011-09-27 NOTE — Patient Instructions (Signed)
Thank you for coming in today. For mood: Lets continue your medicines at a stable dose for a while.  For Pain lets try 60 Vicodin pills a month. This is 2 pills a day on average. Try to make it last.  For smoking see your new doctor or meet with Dr. Raymondo Band for medcines. Make an appointment.

## 2011-10-07 ENCOUNTER — Other Ambulatory Visit: Payer: Self-pay | Admitting: Family Medicine

## 2011-10-18 ENCOUNTER — Ambulatory Visit: Payer: Medicaid Other | Admitting: Family Medicine

## 2011-11-02 ENCOUNTER — Other Ambulatory Visit: Payer: Self-pay | Admitting: Family Medicine

## 2011-11-06 ENCOUNTER — Ambulatory Visit: Payer: Medicaid Other | Admitting: Family Medicine

## 2011-11-08 ENCOUNTER — Encounter: Payer: Self-pay | Admitting: Family Medicine

## 2011-11-08 ENCOUNTER — Ambulatory Visit (INDEPENDENT_AMBULATORY_CARE_PROVIDER_SITE_OTHER): Payer: Medicaid Other | Admitting: Family Medicine

## 2011-11-08 VITALS — BP 122/81 | HR 94 | Ht 64.0 in | Wt 178.0 lb

## 2011-11-08 DIAGNOSIS — M549 Dorsalgia, unspecified: Secondary | ICD-10-CM

## 2011-11-08 DIAGNOSIS — M25551 Pain in right hip: Secondary | ICD-10-CM

## 2011-11-08 DIAGNOSIS — L301 Dyshidrosis [pompholyx]: Secondary | ICD-10-CM | POA: Insufficient documentation

## 2011-11-08 DIAGNOSIS — J45909 Unspecified asthma, uncomplicated: Secondary | ICD-10-CM

## 2011-11-08 DIAGNOSIS — M25559 Pain in unspecified hip: Secondary | ICD-10-CM

## 2011-11-08 DIAGNOSIS — F17200 Nicotine dependence, unspecified, uncomplicated: Secondary | ICD-10-CM

## 2011-11-08 MED ORDER — OLANZAPINE 10 MG PO TABS: 10.0000 mg | ORAL_TABLET | Freq: Every day | ORAL | Status: DC

## 2011-11-08 MED ORDER — HYDROCODONE-ACETAMINOPHEN 5-500 MG PO TABS: 1.0000 | ORAL_TABLET | Freq: Two times a day (BID) | ORAL | Status: DC | PRN

## 2011-11-08 MED ORDER — FLUOXETINE HCL 40 MG PO CAPS: 40.0000 mg | ORAL_CAPSULE | Freq: Every day | ORAL | Status: DC

## 2011-11-08 MED ORDER — CYCLOBENZAPRINE HCL 10 MG PO TABS: 10.0000 mg | ORAL_TABLET | Freq: Three times a day (TID) | ORAL | Status: DC | PRN

## 2011-11-08 MED ORDER — LAMOTRIGINE 100 MG PO TABS: 100.0000 mg | ORAL_TABLET | Freq: Every day | ORAL | Status: DC

## 2011-11-08 MED ORDER — TRIAMCINOLONE ACETONIDE 0.025 % EX OINT: TOPICAL_OINTMENT | Freq: Two times a day (BID) | CUTANEOUS | Status: DC

## 2011-11-08 NOTE — Assessment & Plan Note (Signed)
Stable. Refill vicodin and flexeril

## 2011-11-08 NOTE — Patient Instructions (Addendum)
The skin looks like a variation of eczema. I am giving you a steroid cream for it. If it gets worst, come back.  I'll see you back in 2 months for the blood pressure and the asthma

## 2011-11-08 NOTE — Progress Notes (Signed)
Patient ID: Stacey Wong, female   DOB: 17-Oct-1980, 31 y.o.   MRN: [id number] Patient ID: Stacey Wong    DOB: 27-Mar-1981, 31 y.o.   MRN: [id number] --- Subjective:  Stacey Wong is a 31 y.o.female with h/o hip dysplasia and chronic pain, who presents to meet new PCP and for refills on meds as well as skin blisters.   SKin blisters:  Onset: no onset factor  Time period of: a few years Severity: na Course of symptoms over time: stable in the last couple if years. starts as "pimple" then becomes brown and peels off. + itching. No erythema, no drainage, used antifungal for a few applications but did not use for many days and results have been the same.  Aggravating: chemicals Alleviating: lotion Associated sx/sn: no fever, no recent travel  Chronic back pain: from hip dysplasia. sharp shooting in lower left aspect. Takes vicodin and flexeril. No new weakness, loss of sensation, urinary or bowel incontinence.   ROS: see HPI Past Medical History: reviewed and updated medications and allergies. Social History: Tobacco: 1/2 pack per day. Plans on quitting in winter  Objective: Filed Vitals:   11/08/11 1510  BP: 122/81  Pulse: 94    Physical Examination:   General appearance - alert, well appearing, and in no distress Ears - bilateral TM's and external ear canals normal Nose - normal and patent, no erythema, discharge or polyps Mouth - mucous membranes moist, pharynx normal without lesions Neck - supple, no significant adenopathy Chest - clear to auscultation, scattered expiratory wheezed Heart - normal rate, regular rhythm, normal S1, S2, no murmurs, rubs, clicks or gallops Abdomen - soft, nontender, nondistended, no masses or organomegaly Extremities - peripheral pulses normal, no pedal edema, no clubbing or cyanosis Skin - small dry flaky macules, no warmth or drainage

## 2011-11-08 NOTE — Assessment & Plan Note (Signed)
Plans on quitting before hip surgery in the fall. Patient to call Dr. Raymondo Band for appointment when ready

## 2011-11-08 NOTE — Assessment & Plan Note (Signed)
Not very well controled. Could likely benefit from controler inhaler. Patient was once told that this would put her at risk for oral fungal infcetion and she refused. WIll reassess this at next visit in 1 month.

## 2011-11-08 NOTE — Assessment & Plan Note (Signed)
Start triamcinolone ointment on affected areas

## 2011-11-17 ENCOUNTER — Other Ambulatory Visit: Payer: Self-pay | Admitting: Family Medicine

## 2011-12-02 ENCOUNTER — Other Ambulatory Visit: Payer: Self-pay | Admitting: Family Medicine

## 2011-12-02 ENCOUNTER — Other Ambulatory Visit: Payer: Self-pay | Admitting: *Deleted

## 2011-12-02 DIAGNOSIS — I10 Essential (primary) hypertension: Secondary | ICD-10-CM

## 2011-12-02 MED ORDER — HYDROCHLOROTHIAZIDE 25 MG PO TABS: 25.0000 mg | ORAL_TABLET | Freq: Every day | ORAL | Status: DC

## 2011-12-02 NOTE — Telephone Encounter (Signed)
Please call patient to let her know that I will refill her medication but she needs to come to office for lab work to make sure that her potassium is normal

## 2011-12-03 NOTE — Telephone Encounter (Signed)
Waiting for call back. meds are at the pharmacy. Pt needs lab appt. Stacey Wong, Stacey Wong

## 2011-12-10 ENCOUNTER — Encounter: Payer: Self-pay | Admitting: Family Medicine

## 2011-12-10 ENCOUNTER — Ambulatory Visit (INDEPENDENT_AMBULATORY_CARE_PROVIDER_SITE_OTHER): Payer: Medicaid Other | Admitting: Family Medicine

## 2011-12-10 VITALS — BP 120/81 | HR 82 | Ht 64.0 in | Wt 176.0 lb

## 2011-12-10 DIAGNOSIS — M25551 Pain in right hip: Secondary | ICD-10-CM

## 2011-12-10 DIAGNOSIS — F17200 Nicotine dependence, unspecified, uncomplicated: Secondary | ICD-10-CM

## 2011-12-10 DIAGNOSIS — M25559 Pain in unspecified hip: Secondary | ICD-10-CM

## 2011-12-10 DIAGNOSIS — L301 Dyshidrosis [pompholyx]: Secondary | ICD-10-CM

## 2011-12-10 MED ORDER — TRIAMCINOLONE ACETONIDE 0.5 % EX OINT: TOPICAL_OINTMENT | Freq: Two times a day (BID) | CUTANEOUS | Status: AC

## 2011-12-10 MED ORDER — HYDROCODONE-ACETAMINOPHEN 5-500 MG PO TABS: 1.0000 | ORAL_TABLET | Freq: Three times a day (TID) | ORAL | Status: DC | PRN

## 2011-12-10 NOTE — Patient Instructions (Addendum)
Use the triamcinolone ointment at night and keep it on throughout the night.   For the smoking cessation, you can make an appointment at the front desk to see Dr. Raymondo Band with the pharmacy clinic.

## 2011-12-12 NOTE — Assessment & Plan Note (Addendum)
Refilled vicodin 5/500 and readjusted amount of pills for patient taking 3 tablets daily; ie 90 tabs for 1 month. Patient agreed with plan.  Patient to continue with plan of surgery.

## 2011-12-12 NOTE — Progress Notes (Signed)
Patient ID: Stacey Wong, female   DOB: [birth year], 31 y.o.   MRN: [id number] Patient ID: Stacey Wong    DOB: Dec 01, 1980, 31 y.o.   MRN: [id number] --- Subjective:  Stacey Wong is a 31 y.o.female with h/o hip dysplasia who presents for follow up on right hip pain and medicatoin refill.  - hip pain: 10/10. Ran out of medicine 2-3 weeks ago. HAs been taking vicodin 3-4 per day. Tylenol and ibuprofen have not really helped. Walking makes it worst. Laying down is better. Difficulty standing for long periods of time. Grocery shopping is difficult. Has not had physical therapy. Is scheduled for surhery in the fall. Sees Dr. Ranell Patrick at [facility].  - rash on hands: was started on triamcinolone ointment at last visit and diagnosed with pompholyx eczema. She tried the steroid ointment but did not feel like it helped. She washes her hands multiple times daily. No signs of infection. Start like little vesicle and then become dry and brown and disappear. No itching.    ROS: see HPI Past Medical History: reviewed and updated medications and allergies. Social History: Tobacco: current smoker  Objective: Filed Vitals:   12/10/11 1554  BP: 120/81  Pulse: 82    Physical Examination:   General appearance - alert, anxious appearing Chest - clear to auscultation, no wheezes, rales or rhonchi, symmetric air entry Heart - normal rate, regular rhythm, normal S1, S2, no murmurs, rubs, clicks or gallops MSK - pain with right hip flexion, knee extension and flexion. Mild tenderness to palpation of right greater trochanter. Strength decreased due to pain in right leg. 5/5 strength in left leg.  Skin - area of skin dryness with small vesicle and other areas on hand with small light brown macule.

## 2011-12-12 NOTE — Assessment & Plan Note (Signed)
Previous treatment with triamcinolone may have not been successful given that patient was washing hands multiple times daily not allowing ointment to act. PRescribed stronger potency triamcinolone and recommended that patient apply cream at nighttime.

## 2011-12-12 NOTE — Assessment & Plan Note (Signed)
Encouraged to meet with Dr. Raymondo Band as patient expresses interest in quitting

## 2011-12-13 ENCOUNTER — Encounter: Payer: Self-pay | Admitting: Family Medicine

## 2011-12-23 ENCOUNTER — Telehealth: Payer: Self-pay | Admitting: *Deleted

## 2011-12-23 NOTE — Telephone Encounter (Signed)
Message copied by Arlyss Repress on Mon Dec 23, 2011  4:25 PM ------      Message from: Lonia Skinner      Created: Fri Dec 13, 2011  1:02 PM       Hi Renato Battles,      I'm not at the clinic or the hospital because my ER shift starts at 3pm today and I was wondering if you would have time to call Marda Breidenbach to ask her if she can bring the CD with the xrays of her right hip. I am trying to write a letter for her and realize that I need more information. In addition to my letter, she may also want a letter from her orthopedist who knows this particular problem a little better than me.      Thank you so much,      Judeth Cornfield

## 2011-12-23 NOTE — Telephone Encounter (Signed)
Left message for pt to call back. Please see Dr.Losq's message. Lorenda Hatchet, Renato Battles

## 2011-12-26 NOTE — Telephone Encounter (Signed)
Called pt x 2 . She never called back. No upcoming appt. Fwd. To PCP for info. Lorenda Hatchet, Renato Battles

## 2012-01-08 ENCOUNTER — Encounter: Payer: Self-pay | Admitting: Family Medicine

## 2012-01-08 ENCOUNTER — Ambulatory Visit (INDEPENDENT_AMBULATORY_CARE_PROVIDER_SITE_OTHER): Payer: Medicaid Other | Admitting: Family Medicine

## 2012-01-08 VITALS — BP 128/84 | HR 77 | Ht 64.0 in | Wt 181.0 lb

## 2012-01-08 DIAGNOSIS — J45909 Unspecified asthma, uncomplicated: Secondary | ICD-10-CM

## 2012-01-08 DIAGNOSIS — M25551 Pain in right hip: Secondary | ICD-10-CM

## 2012-01-08 DIAGNOSIS — M25559 Pain in unspecified hip: Secondary | ICD-10-CM

## 2012-01-08 DIAGNOSIS — F17200 Nicotine dependence, unspecified, uncomplicated: Secondary | ICD-10-CM

## 2012-01-08 MED ORDER — ALBUTEROL SULFATE HFA 108 (90 BASE) MCG/ACT IN AERS: 2.0000 | INHALATION_SPRAY | Freq: Four times a day (QID) | RESPIRATORY_TRACT | Status: DC | PRN

## 2012-01-08 MED ORDER — HYDROCODONE-ACETAMINOPHEN 5-500 MG PO TABS: 1.0000 | ORAL_TABLET | Freq: Three times a day (TID) | ORAL | Status: DC | PRN

## 2012-01-08 NOTE — Patient Instructions (Addendum)
For your allergies, cetirizine or claritin are good medicines.  I would recommend starting the inhaled controller medicine for your asthma but whenever you're ready.

## 2012-01-13 NOTE — Assessment & Plan Note (Signed)
Persistent and not well controled. Strongly encouraged patient to consider controler inhaler. She is still afraid that she will get fungal infection but she would like to think about it. Will ask at next visit. I encouraged flu vaccine which patient declined.

## 2012-01-13 NOTE — Progress Notes (Signed)
Patient ID: Stacey Wong, female   DOB: 03/26/1981, 31 y.o.   MRN: [id number] Patient ID: Stacey Wong    DOB: 1981/02/05, 31 y.o.   MRN: [id number] --- Subjective:  Stacey Wong is a 31 y.o.female with h/o congenital dysplasia of right hip who presents for follow up on right hip pain.  - right hip pain: stable compared to last month. 8/10 at its worst, throbbing, deep pain, worst with walking. Walking for supermarket shopping is difficult. Pain medicine helps to bring pain down to 4-5/10. She plans on making appointment to have surgery in October. No new increased weakness or numbness on lower extremities. Does have some left lower back pain associated with change in position that she adopts to make her right  Hip more comfortable.  - asthma: continues to smoke 1/2 pack per day. Uses albuterol inhaler every other day. She denies any fever or chills. Is not on a controller medicine because she is scared she will have a mouth fungal infection if she does.  - pompholyx eczema: not using eucerin at night. Spots and irritation are roughly the same.   ROS: see HPI Past Medical History: reviewed and updated medications and allergies. Social History: Tobacco: current smoker  Objective: Filed Vitals:   01/08/12 1006  BP: 128/84  Pulse: 77    Physical Examination:   General appearance - alert, well appearing, and in no distress Chest - clear to auscultation, no wheezes, rales or rhonchi, symmetric air entry Heart - normal rate, regular rhythm, normal S1, S2, no murmurs, rubs, clicks or gallops Extremities - no tenderness along trochanter, reproducible pain with flexion of the hip, extension and flexion of the right knee. Right thigh appears more atrophic than left. Decreased strength in right lower extremity compared to left. Sensation intact.  Skin - dry, peeling skin on palms of hands bilaterally, no erythema, no bleeding, no signs of infection

## 2012-01-13 NOTE — Assessment & Plan Note (Signed)
Has not quit yet. Would like to, agter the stress of the surgery is over. Will continue to ask about cessation and refer to Pharmacy clinic

## 2012-01-13 NOTE — Assessment & Plan Note (Signed)
Refilled vicodin 5/500. Pain at baseline. Patient to meet with surgeon in October for hip replacement.

## 2012-02-07 ENCOUNTER — Ambulatory Visit (INDEPENDENT_AMBULATORY_CARE_PROVIDER_SITE_OTHER): Payer: Medicaid Other | Admitting: Family Medicine

## 2012-02-07 ENCOUNTER — Encounter: Payer: Self-pay | Admitting: Family Medicine

## 2012-02-07 VITALS — BP 143/98 | HR 103 | Temp 98.1°F | Ht 64.0 in | Wt 178.0 lb

## 2012-02-07 DIAGNOSIS — F319 Bipolar disorder, unspecified: Secondary | ICD-10-CM

## 2012-02-07 DIAGNOSIS — M25551 Pain in right hip: Secondary | ICD-10-CM

## 2012-02-07 DIAGNOSIS — M25559 Pain in unspecified hip: Secondary | ICD-10-CM

## 2012-02-07 MED ORDER — HYDROCODONE-ACETAMINOPHEN 5-500 MG PO TABS: 1.0000 | ORAL_TABLET | Freq: Three times a day (TID) | ORAL | Status: DC | PRN

## 2012-02-07 NOTE — Patient Instructions (Addendum)
For the lamictal: start back on 50mg  daily (1/2 tablet) for 1 week and then increase to 100mg  daily for 1 week, and then 150mg  daily. Call the office, if you can't cut it so that I can send a prescription for 50mg  tablet. For the prozac, take 20mg  for 1 week and then increase to 40mg  daily.  I'll see you back in 3 weeks to see how things are going.

## 2012-02-08 NOTE — Assessment & Plan Note (Signed)
Congenital dysplasia or right hip: refilled vicodin 5/500 tid. Encouraged patient to meet with orthopedist to discuss the option of surgery.

## 2012-02-08 NOTE — Progress Notes (Signed)
Patient ID: Stacey Wong, female   DOB: 1980-05-28, 31 y.o.   MRN: [id number] Patient ID: Stacey Wong    DOB: 1981/03/16, 31 y.o.   MRN: [id number] --- Subjective:  Stacey Wong is a 31 y.o.female with h/o congenital dysplasia of right hip and bipolar disorder who presents for follow up on right hip pain as well as concern about her bipolar medicine - Bipolar: patient heard that olanzapine can cause tardive dyskinesia which she could not tolerate as a possible side effect of the medicine. She therefore stopped taking it, along with lamotrigine and prozac, 3 weeks ago. Since then, she has been having mood swings, feeling low at times and at others feeling like she has a lot of energy and doesn't need much sleep. She reports decrease in appetite and loss of interests. MDQ results: yes for all questions in section 1, yes for section 2 and "minor problem" in section 3. PHQ9: 16 (2 for questions: 1,2,3,4,6,8,9; 1 point for 5,7. She reports having had passive thoughts of feeling she would be better off dead in the past but denies any concrete plan or means to do so and currently denies any SI/HI.   - right hip pain: stable with vicodin 5/500 tid. Has not yet seen the orthopedic surgeon for eval for surgery given fear of surgery. Denies any new onset weakness or loss of sensation.    ROS: see HPI Past Medical History: reviewed and updated medications and allergies. Social History: Tobacco: current smoker: 1/2 pack per day  Objective: Filed Vitals:   02/07/12 1434  BP: 143/98  Pulse: 103  Temp: 98.1 F (36.7 C)    Physical Examination:   General appearance - alert, well appearing, and in no distress, normal affect.  Chest - clear to auscultation, no wheezes, rales or rhonchi, symmetric air entry Heart - normal rate, regular rhythm, normal S1, S2, no murmurs, rubs, clicks or gallops Extremities - reproducible pain with flexion of the hip, extension and flexion of the right knee, no lower extremity  edema

## 2012-02-08 NOTE — Assessment & Plan Note (Signed)
Relapse given that patient stopped taking all three mood stabilizer medications 3 weeks ago: olanzapine, lamictal and prozac. Patient afraid of tardive dyskinesia side effect of olanzapine. Recommended that patient start back on lamictal and prozac for now and that third option medication be added as needed. See AVS for medicine taper up. Patient to return in 2-3 weeks for eval of how medication is working. Patient reports not tolerating seroquel in the past, so other option will be needed as possible 3rd agent, if needed. Patient denied any current SI during visit. Will follow patient closely as medications are re-introduced and titrated up.

## 2012-02-15 ENCOUNTER — Other Ambulatory Visit: Payer: Self-pay | Admitting: Family Medicine

## 2012-02-27 ENCOUNTER — Ambulatory Visit: Payer: Medicaid Other | Admitting: Family Medicine

## 2012-03-04 ENCOUNTER — Other Ambulatory Visit: Payer: Self-pay | Admitting: Family Medicine

## 2012-03-18 ENCOUNTER — Ambulatory Visit (INDEPENDENT_AMBULATORY_CARE_PROVIDER_SITE_OTHER): Payer: Medicaid Other | Admitting: Family Medicine

## 2012-03-18 ENCOUNTER — Encounter: Payer: Self-pay | Admitting: Family Medicine

## 2012-03-18 VITALS — BP 124/83 | HR 97 | Ht 64.0 in | Wt 175.0 lb

## 2012-03-18 DIAGNOSIS — M25551 Pain in right hip: Secondary | ICD-10-CM

## 2012-03-18 DIAGNOSIS — F319 Bipolar disorder, unspecified: Secondary | ICD-10-CM

## 2012-03-18 DIAGNOSIS — F3130 Bipolar disorder, current episode depressed, mild or moderate severity, unspecified: Secondary | ICD-10-CM

## 2012-03-18 DIAGNOSIS — M25559 Pain in unspecified hip: Secondary | ICD-10-CM

## 2012-03-18 MED ORDER — HYDROCHLOROTHIAZIDE 25 MG PO TABS: 25.0000 mg | ORAL_TABLET | Freq: Every day | ORAL | Status: DC

## 2012-03-18 MED ORDER — LAMOTRIGINE 100 MG PO TABS: 100.0000 mg | ORAL_TABLET | Freq: Every day | ORAL | Status: DC

## 2012-03-18 MED ORDER — HYDROCODONE-ACETAMINOPHEN 5-500 MG PO TABS: 1.0000 | ORAL_TABLET | Freq: Three times a day (TID) | ORAL | Status: DC | PRN

## 2012-03-18 MED ORDER — FLUOXETINE HCL 40 MG PO CAPS: 40.0000 mg | ORAL_CAPSULE | Freq: Every day | ORAL | Status: DC

## 2012-03-18 NOTE — Assessment & Plan Note (Signed)
Not on medications currently. Used to be controled on zyprexa, amictal and prozac. Doesn't want to be on zyprexa due to concern of tardive dyskinesia. Restarted patient on lamictal and prozac, but without antipsychotic, she will not have coverage for mania. Called patient after clinic and left message for her to return to clinic in 10 days to monitor symptoms. Will address with patient the possibility of starting back on zyprexa given the fact that tardive dyskinesia, although a side effect, is more rare. Benefits of medicine appear to outweigh this given risk.Marland Kitchen

## 2012-03-18 NOTE — Assessment & Plan Note (Signed)
Pain with loss of function. Refill vicdin for 1 month supply. Patient to follow up on this in 1 month

## 2012-03-18 NOTE — Progress Notes (Signed)
Patient ID: Stacey Wong, female   DOB: 08-08-1980, 31 y.o.   MRN: [id number] Patient ID: Stacey Wong    DOB: 10/02/1980, 31 y.o.   MRN: [id number] --- Subjective:  Stacey Wong is a 31 y.o.female who presents for follow up on bipolar disorder and chronic pain from right hip dysplasia.  - bipolar disorder: has not been taking her medications because there was a problem with filling the prozac at the pharmacy. She reports feeling low and not having any interest or energy in doing things. She has trouble getting out of bed and has been helped to do things around the house by her mom. She denies any SI/HI. She denies any recent period of excessive energy or periods of not needing sleep.   - right hip pain: she has not been to her last visit and therefore has been out of pain medications. She reports not being able to walk through the grocery store without excessive pain. She denies any new weakness, loss of sensation or tingling in lower extremities.    ROS: see HPI Past Medical History: reviewed and updated medications and allergies. Social History: Tobacco:current smoker  Objective: Filed Vitals:   03/18/12 1349  BP: 124/83  Pulse: 97    Physical Examination:   General appearance - alert, anxious appearing Chest - clear to auscultation, occasional bilateral wheezing Heart - normal rate, regular rhythm, normal S1, S2, no murmurs, rubs, clicks or gallops Abdomen - soft, nontender, nondistended, no masses or organomegaly Right hip: no tenderness to palpation along greater trochanter, 4+/5 strength hip flexion compared to left 5/5, normal sensation to light touch, normal external and internal rotation of right hip.

## 2012-03-18 NOTE — Patient Instructions (Addendum)
For the lamictal: start back on 50mg  daily (1/2 tablet) for 1 week and then increase to 100mg  daily for 1 week, and then 150mg  daily. Call the office, if you can't cut it so that I can send a prescription for 50mg  tablet. For the prozac, take 20mg  for 1 week and then increase to 40mg  daily.   I'd like to see you back in 3 weeks to see how you are feeling with the medicine.

## 2012-03-30 ENCOUNTER — Encounter: Payer: Self-pay | Admitting: Home Health Services

## 2012-03-31 ENCOUNTER — Ambulatory Visit: Payer: Medicaid Other | Admitting: Family Medicine

## 2012-04-09 ENCOUNTER — Ambulatory Visit: Payer: Medicaid Other | Admitting: Family Medicine

## 2012-09-11 ENCOUNTER — Emergency Department (INDEPENDENT_AMBULATORY_CARE_PROVIDER_SITE_OTHER)
Admission: EM | Admit: 2012-09-11 | Discharge: 2012-09-11 | Disposition: A | Discharge status: Home / Self Care | Payer: Medicaid Other | Source: Home / Self Care | Attending: Family Medicine | Admitting: Family Medicine

## 2012-09-11 ENCOUNTER — Encounter (HOSPITAL_COMMUNITY): Payer: Self-pay | Admitting: Emergency Medicine

## 2012-09-11 DIAGNOSIS — M25551 Pain in right hip: Secondary | ICD-10-CM

## 2012-09-11 DIAGNOSIS — G8929 Other chronic pain: Secondary | ICD-10-CM

## 2012-09-11 DIAGNOSIS — M25559 Pain in unspecified hip: Secondary | ICD-10-CM

## 2012-09-11 MED ORDER — KETOROLAC TROMETHAMINE 10 MG PO TABS: 10.0000 mg | ORAL_TABLET | Freq: Four times a day (QID) | ORAL | Status: DC | PRN

## 2012-09-11 MED ORDER — KETOROLAC TROMETHAMINE 30 MG/ML IJ SOLN
INTRAMUSCULAR | Status: AC
  Filled 2012-09-11: qty 1

## 2012-09-11 MED ORDER — KETOROLAC TROMETHAMINE 30 MG/ML IJ SOLN
30.0000 mg | Freq: Once | INTRAMUSCULAR | Status: AC
  Administered 2012-09-11: 30 mg via INTRAMUSCULAR

## 2012-09-11 NOTE — ED Provider Notes (Signed)
History     CSN: [id number]  Arrival date & time 09/11/12  7829   First MD Initiated Contact with Patient 09/11/12 1940      Chief Complaint  Patient presents with  . Hip Pain    (Consider location/radiation/quality/duration/timing/severity/associated sxs/prior treatment) Patient is a 32 y.o. female presenting with hip pain. The history is provided by the patient and a parent.  Hip Pain This is a chronic problem. The current episode started more than 1 week ago (hip dysplasia on right with chronic pain now, no insurance to see ortho.). The problem has been gradually worsening.    Past Medical History  Diagnosis Date  . Asthma   . Hypertension   . Bipolar 1 disorder     Past Surgical History  Procedure Laterality Date  . Cholecystectomy      No family history on file.  History  Substance Use Topics  . Smoking status: Current Every Day Smoker -- 0.50 packs/day    Types: Cigarettes  . Smokeless tobacco: Not on file     Comment: decreased smoking  . Alcohol Use: Yes     Comment: rare    OB History   Grav Para Term Preterm Abortions TAB SAB Ect Mult Living                  Review of Systems  Constitutional: Negative.   Gastrointestinal: Negative.   Musculoskeletal: Positive for gait problem. Negative for back pain and joint swelling.    Allergies  Review of patient's allergies indicates no known allergies.  Home Medications   Current Outpatient Rx  Name  Route  Sig  Dispense  Refill  . albuterol (PROVENTIL HFA;VENTOLIN HFA) 108 (90 BASE) MCG/ACT inhaler   Inhalation   Inhale 2 puffs into the lungs every 6 (six) hours as needed. For shortness of breath.   1 Inhaler   3   . cyclobenzaprine (FLEXERIL) 10 MG tablet   Oral   Take 1 tablet (10 mg total) by mouth 3 (three) times daily as needed. For spasms   60 tablet   3   . FLUoxetine (PROZAC) 40 MG capsule   Oral   Take 1 capsule (40 mg total) by mouth daily.   30 capsule   3   .  hydrochlorothiazide (HYDRODIURIL) 25 MG tablet   Oral   Take 1 tablet (25 mg total) by mouth daily.   30 tablet   3   . HYDROcodone-acetaminophen (VICODIN) 5-500 MG per tablet   Oral   Take 1 tablet by mouth every 8 (eight) hours as needed for pain.   90 tablet   1   . ketorolac (TORADOL) 10 MG tablet   Oral   Take 1 tablet (10 mg total) by mouth every 6 (six) hours as needed for pain.   30 tablet   0   . lamoTRIgine (LAMICTAL) 100 MG tablet   Oral   Take 1 tablet (100 mg total) by mouth daily.   30 tablet   0   . EXPIRED: OLANZapine (ZYPREXA) 10 MG tablet   Oral   Take 1 tablet (10 mg total) by mouth at bedtime.   30 tablet   3   . OLANZapine (ZYPREXA) 10 MG tablet   Oral   Take 1 tablet (10 mg total) by mouth at bedtime.   30 tablet   0   . triamcinolone ointment (KENALOG) 0.5 %   Topical   Apply topically 2 (two) times daily.  30 g   0     BP 123/82  Pulse 69  Temp(Src) 99.1 F (37.3 C) (Oral)  Resp 18  SpO2 100%  LMP 09/09/2012  Physical Exam  Nursing note and vitals reviewed. Constitutional: She is oriented to person, place, and time. She appears well-developed and well-nourished.  Musculoskeletal: She exhibits tenderness.       Right hip: She exhibits decreased range of motion, tenderness and crepitus.  Distal nvt intact.  Neurological: She is alert and oriented to person, place, and time.  Skin: Skin is warm and dry.    ED Course  Procedures (including critical care time)  Labs Reviewed - No data to display No results found.   1. Hip pain, chronic, right       MDM          Linna Hoff, MD 09/11/12 2014

## 2012-09-11 NOTE — ED Notes (Signed)
Pt c/o hip pain on right side that radiates down to right leg Reports hx of hip dysplasia; goes to [facility] but has not been able to get in and see them Taking aleve w/no relief  She is alert and oriented w/no signs of acute distress.

## 2012-09-14 ENCOUNTER — Encounter: Payer: Self-pay | Admitting: Family Medicine

## 2012-09-14 ENCOUNTER — Ambulatory Visit (INDEPENDENT_AMBULATORY_CARE_PROVIDER_SITE_OTHER): Payer: Medicaid Other | Admitting: Family Medicine

## 2012-09-14 VITALS — BP 110/60 | HR 72 | Temp 97.9°F | Ht 64.0 in | Wt 162.0 lb

## 2012-09-14 DIAGNOSIS — M25559 Pain in unspecified hip: Secondary | ICD-10-CM

## 2012-09-14 DIAGNOSIS — M25551 Pain in right hip: Secondary | ICD-10-CM

## 2012-09-14 DIAGNOSIS — F319 Bipolar disorder, unspecified: Secondary | ICD-10-CM

## 2012-09-14 MED ORDER — HYDROCODONE-ACETAMINOPHEN 5-325 MG PO TABS: 1.0000 | ORAL_TABLET | Freq: Three times a day (TID) | ORAL | Status: DC | PRN

## 2012-09-14 NOTE — Patient Instructions (Signed)
It was good to see you today. I have given you one month of pain medication.  As we discussed, this is a slight exception to our normal rule due to Dr. Gwenlyn Saran being out of the office for one month.  All future refills on pain medication will have to come from her. If you start to have more problems with your mood or any thoughts of hurting yourself, get in touch with [place] immediately.

## 2012-09-14 NOTE — Assessment & Plan Note (Signed)
The patient is not currently overtly manic. She does admit that her mood is fluctuating somewhat but denies any suicidal or homicidal ideation. She is not interested in starting back on any medications at this time due to cost concerns. I counseled on the importance of medication management for her disorder and she has agreed to contact us immediately if her mood worsens.

## 2012-09-14 NOTE — Progress Notes (Signed)
Patient ID: RIELY OETKEN, female   DOB: 12/31/1980, 32 y.o.   MRN: [id number] Subjective: The patient is a 32 y.o. year old female who presents today for hip pain.  Patient has chronic right hip pain from congenital hip dysplasia. She is currently without insurance and therefore unable to proceed with surgical intervention. She has not been seen in our office for some time. She was seen at urgent care several days ago as her pain has been worsening and she is unable to sleep. She's given an injection of Toradol which caused her to have flulike symptoms and minimal pain relief. The patient had previously been given low-dose hydrocodone but have not seen this in some time do to her insurance status. She is hoping to have a refill on this medication today. She reports that the pain is located in her right hip as 10 out of 10 in intensity. It is not significantly changed from previously. She is noticing some pain in her left hip intermittently, she feels is likely related to how she is standing. She denies any bowel or bladder dysfunction.  Patient is also diagnosed bipolar disorder. She is not taking her medications but cannot afford them. She is not interested in restarting at this time. She denies any suicidal or homicidal ideation.  Patient's past medical, social, and family history were reviewed and updated as appropriate. History  Substance Use Topics  . Smoking status: Current Every Day Smoker -- 0.50 packs/day    Types: Cigarettes  . Smokeless tobacco: Not on file     Comment: decreased smoking  . Alcohol Use: Yes     Comment: rare   Objective:  Filed Vitals:   09/14/12 1405  BP: 110/60  Pulse: 72  Temp: 97.9 F (36.6 C)   Gen: NAD  Assessment/Plan:  Please also see individual problems in problem list for problem-specific plans.

## 2012-09-14 NOTE — Assessment & Plan Note (Signed)
Refill hydrocodone at 3 pills per day. I given her a one-month supply. She'll need followup with her primary care provider to discuss longer-term management options. I also instructed her to obtain information from [place] about an orange card if she is not able to manage any other form of insurance.

## 2012-10-07 ENCOUNTER — Ambulatory Visit (INDEPENDENT_AMBULATORY_CARE_PROVIDER_SITE_OTHER): Payer: Medicaid Other | Admitting: Family Medicine

## 2012-10-07 ENCOUNTER — Encounter: Payer: Self-pay | Admitting: Family Medicine

## 2012-10-07 VITALS — BP 114/80 | HR 81 | Ht 64.0 in | Wt 156.0 lb

## 2012-10-07 DIAGNOSIS — M25551 Pain in right hip: Secondary | ICD-10-CM

## 2012-10-07 DIAGNOSIS — M25559 Pain in unspecified hip: Secondary | ICD-10-CM

## 2012-10-07 DIAGNOSIS — F319 Bipolar disorder, unspecified: Secondary | ICD-10-CM

## 2012-10-07 MED ORDER — OLANZAPINE 10 MG PO TABS: 10.0000 mg | ORAL_TABLET | Freq: Every day | ORAL | Status: DC

## 2012-10-07 MED ORDER — HYDROCODONE-ACETAMINOPHEN 5-325 MG PO TABS: 1.0000 | ORAL_TABLET | Freq: Four times a day (QID) | ORAL | Status: DC | PRN

## 2012-10-07 MED ORDER — FLUOXETINE HCL 20 MG PO TABS: 20.0000 mg | ORAL_TABLET | Freq: Every day | ORAL | Status: DC

## 2012-10-07 MED ORDER — LAMOTRIGINE 25 MG PO TABS: 25.0000 mg | ORAL_TABLET | Freq: Every day | ORAL | Status: DC

## 2012-10-07 NOTE — Patient Instructions (Addendum)
For the lamictal: Take 1 tablet of 25mg  daily for weeks 1 and 2. Then increase to 2 tablets for weeks 3 and 4. Then increase to 4 tablets for week 5 onward For the zyprexa, take one tablet of 10mg  daily For the prozac, we will start with 20mg  daily.   Follow up in 1 month

## 2012-10-09 NOTE — Progress Notes (Signed)
Patient ID: Stacey Wong    DOB: Mar 20, 1981, 32 y.o.   MRN: [id number] --- Subjective:  Stacey Wong is a 32 y.o.female who presents for follow up on hip pain and for bipolar disorder.  - right hip pain from congenital hip dysplasia. Patient lost insurance and did not get hip replacement as planned last September. She continues to have significant pain, worst at night, located in the right groin and radiating to the posterior aspect of hip. She also occasionally has pain that radiates to leg. She has been taking hydrocodone 5/325mg  twice in the day and 2 pills at night. No new weakness.  - bipolar disorder: quit taking her meds last year because she was afraid of the side effects they could cause (tardive dyskinesia)as well as due to cost once she lost her insurance. She now would like to get back on them because she feels like she is no longer functioning well off of them. She has reduced sleep. She is feeling down and depressed and more irritable. She denies any SI/HI.  She has a history of alcohol use to auto-treat her pain and mood and she admits that she doesn't want to go back to that.   ROS: see HPI Past Medical History: reviewed and updated medications and allergies. Social History: Tobacco: 0.75 packs per day  Objective: Filed Vitals:   10/07/12 1625  BP: 114/80  Pulse: 81    Physical Examination:   General appearance - alert, in mild distress when describing her mood. Tearful at times.  Chest - clear to auscultation, no wheezes, rales or rhonchi, symmetric air entry Heart - normal rate, regular rhythm, normal S1, S2, no murmurs, rubs, clicks or gallops Hip exam - right hip visibly smaller than left, right leg 1cm longer than left, Pain with external rotation of hip. Decreased flexibility and increased pain with FABER, no asis tenderness, no si joint tenderness, no lumbar spine tenderness.

## 2012-10-10 NOTE — Assessment & Plan Note (Addendum)
Patient originally on lamictal 100mg  daily, zyprexa 10mg  daily and fluoxetine 40mg  daily which she tolerated well and which controled her mood.  Patient currently without resources for medications but she has appointment for orange card/insurance mid June. Gave her info for medication assistance program. Hard copy Rx for prozac 20mg  daily, zyprexa 10mg  and lamictal titration: Take 1 tablet of 25mg  daily for weeks 1 and 2. Then increase to 2 tablets for weeks 3 and 4. Then increase to 4 tablets for week 5 onward

## 2012-10-10 NOTE — Assessment & Plan Note (Signed)
Patient needs hip replacement which she did not get in September from combination of anxiety about surgery as well as loss of insurance thereafter. Patient to make appointment with surgeon once she gets insurance again.  In the meantime, in order to avoid auto-medication with alcohol, will try controling pain as best as possible.  Increase to hydrocodone 5/325mg  qid/prn: 120 tablets no refill. Follow up in 1 month.

## 2012-10-23 ENCOUNTER — Telehealth: Payer: Self-pay | Admitting: Family Medicine

## 2012-10-23 NOTE — Telephone Encounter (Signed)
Pt came in to drop off handicap form to be filled out.

## 2012-10-23 NOTE — Telephone Encounter (Signed)
Completed and placed in Dr. Gwenlyn Saran box for signature and completion. Wyatt Haste, RN-BSN

## 2012-10-27 NOTE — Telephone Encounter (Signed)
Called and left message that paper work is ready for pick up at front desk. Wyatt Haste, RN-BSN

## 2012-10-27 NOTE — Telephone Encounter (Signed)
Signed form and placed at front desk for patient to pick up.  Will notify Wyatt Haste to call patient.   Marena Chancy, PGY-2 Family Medicine Resident

## 2012-11-09 ENCOUNTER — Telehealth: Payer: Self-pay | Admitting: Family Medicine

## 2012-11-09 MED ORDER — HYDROCODONE-ACETAMINOPHEN 5-325 MG PO TABS: 1.0000 | ORAL_TABLET | Freq: Four times a day (QID) | ORAL | Status: DC | PRN

## 2012-11-09 NOTE — Telephone Encounter (Signed)
Will fwd to Md.  Dustine Bertini L, CMA  

## 2012-11-09 NOTE — Telephone Encounter (Signed)
Rx written on 11/09/12 for 120 tabs: vicodin 5/325 qid/prn Rx ready for pickup at front desk.  Marena Chancy, PGY-3 Family Medicine Resident

## 2012-11-09 NOTE — Telephone Encounter (Signed)
Patient is calling for a refill on her Hydrocodone which she needs tomorrow.  Her appt is on 7/18 and she doesn't want to have to go 2 days without her pain medication.  Please call her when it is ready to be picked up.

## 2012-11-13 ENCOUNTER — Ambulatory Visit (INDEPENDENT_AMBULATORY_CARE_PROVIDER_SITE_OTHER): Payer: No Typology Code available for payment source | Admitting: Family Medicine

## 2012-11-13 ENCOUNTER — Encounter: Payer: Self-pay | Admitting: Family Medicine

## 2012-11-13 ENCOUNTER — Ambulatory Visit: Payer: No Typology Code available for payment source | Admitting: Family Medicine

## 2012-11-13 VITALS — BP 116/79 | HR 101 | Temp 97.6°F | Ht 64.0 in | Wt 151.2 lb

## 2012-11-13 DIAGNOSIS — M25551 Pain in right hip: Secondary | ICD-10-CM

## 2012-11-13 DIAGNOSIS — M549 Dorsalgia, unspecified: Secondary | ICD-10-CM

## 2012-11-13 DIAGNOSIS — G8929 Other chronic pain: Secondary | ICD-10-CM

## 2012-11-13 DIAGNOSIS — F319 Bipolar disorder, unspecified: Secondary | ICD-10-CM

## 2012-11-13 DIAGNOSIS — M25559 Pain in unspecified hip: Secondary | ICD-10-CM

## 2012-11-13 MED ORDER — ALBUTEROL SULFATE HFA 108 (90 BASE) MCG/ACT IN AERS: 2.0000 | INHALATION_SPRAY | Freq: Four times a day (QID) | RESPIRATORY_TRACT | Status: DC | PRN

## 2012-11-13 MED ORDER — FLUOXETINE HCL 20 MG PO TABS: 20.0000 mg | ORAL_TABLET | Freq: Every day | ORAL | Status: DC

## 2012-11-13 MED ORDER — CYCLOBENZAPRINE HCL 10 MG PO TABS: 10.0000 mg | ORAL_TABLET | Freq: Three times a day (TID) | ORAL | Status: DC | PRN

## 2012-11-13 MED ORDER — LAMOTRIGINE 25 MG PO TABS: 25.0000 mg | ORAL_TABLET | Freq: Every day | ORAL | Status: DC

## 2012-11-13 NOTE — Patient Instructions (Addendum)
For the lamictal: Take 1 tablet of 25mg  daily for weeks 1 and 2. Then increase to 2 tablets for weeks 3 and 4. Then increase to 4 tablets for week 5 onward  For the zyprexa, take one tablet of 10mg  daily  For the prozac, we will start with 20mg  daily.  Follow up in 1 month

## 2012-11-15 NOTE — Assessment & Plan Note (Signed)
Likely from muscle spasms. Refilled flexeril.

## 2012-11-15 NOTE — Assessment & Plan Note (Signed)
Patient needing hip replacement surgery. Difficult situation given the fact that patient only has orange card. I would ideally like for her to be evaluated by orthopedics (she used to be seen at [facility] ortho), but without insurance this will be difficult.  Will obtain plain film of hip for baseline imaging. Her mother wants an MRI. I will start with a plain film for now and hope that she see ortho at some point and have them decide on further imaging.  Will write letter for disability and hope that patient will eventually get disability and insurance. Otherwise, [facility].

## 2012-11-15 NOTE — Progress Notes (Signed)
Patient ID: Stacey Wong    DOB: 1981-03-02, 32 y.o.   MRN: [id number] --- Subjective:  Stacey Wong is a 32 y.o.female with h/o congenital hip dysplagia, bipolar disorder who presents for follow up. She is accompanied by her mother is very concerned and upset at how badly her daughter is feeling with her hip and bipolar. She is also upset at the fact that she has been denied dysability several times.  A majority of the visit was spent listening to patient's mother's frustrations.   - hip pain: located in right hip joint. Mobility continues to be very limited, unable to do grocery shopping or walk more than 100 yards until she feels burning pain in hip joint. The pain radiates to her right knee. She also recently had an episode of spasms in the left part of lower back which were constant for 1 week. She then remembered that she had been prescribed flexeril in the past and she took this which helped with the pain and spasms. Lower back pain resolved a week thereafter. She denies any new lower extremity weakness, urinary or bowel incontinence at the time. She had had back pain like this before but never lasted this long.  The increase in frequency in vicodin has helped the pain in right hip, but pain doesn't resolve completely.   - bipolar: has not yet been able to fill her medications but has appointment with [facility] in the next few days. Her mother reports that she is difficult to interact with and often becomes contrary and even aggressive towards others when she is in a bad mental state. She has been having difficulty sleeping, difficulty focusing as well.  She currently is not drinking alcohol or using any other illicit drugs.  PHQ9: 21 with thought that she would be better off dead, but not wanting to hurt herself or others.    ROS: see HPI Past Medical History: reviewed and updated medications and allergies. Social History: Tobacco: 0.75 pack per day  Objective: Filed Vitals:   11/13/12 1007  BP:  116/79  Pulse: 101  Temp: 97.6 F (36.4 C)    Physical Examination:   General appearance - alert, tearful at times, but in no acute distress Chest - clear to auscultation, no wheezes, rales or rhonchi, symmetric air entry Heart - normal rate, regular rhythm, normal S1, S2, no murmurs MSK - decreased knee flexion, extension, hip flexion on right compared to left, reproducible pain with internal rotation of hip.  Back: no tenderness to palpation along lumbar spine, no paraspinal tenderness bilaterally

## 2012-11-15 NOTE — Assessment & Plan Note (Addendum)
Patient needing prescriptions to have on hand for the [facility] program.  Depression component of bipolar disorder is evident with a score of 21 on the phq9.  Rx for zyprexa, lamictal and prozac.  Patient's bipolar disorder is affecting her decision regarding other parts of her health (notbaly her hip pain). It is affecting her ability to function in society.

## 2012-11-16 ENCOUNTER — Telehealth: Payer: Self-pay | Admitting: Family Medicine

## 2012-11-16 NOTE — Telephone Encounter (Signed)
Called patient and left a message regarding her bipolar medicine.  She told me at last visit that she had not been taking the zyprexa. At the time, I did not realize this. I called her to let her know that I recommend she start back on prozac with the zyprexa as this will help control the highs. The lamictal was added afterwards to better control her symptoms.  If patient needs a hard copy of the prescription, she can call the clinic and let them know and I will leave a copy for her to pick up.  Alternatively, she can touch base with the MAP program and I can fax it to them if they request it specifically.   Marena Chancy, PGY-3 Family Medicine Resident

## 2012-11-19 ENCOUNTER — Telehealth: Payer: Self-pay | Admitting: Family Medicine

## 2012-11-19 NOTE — Telephone Encounter (Signed)
Pt is requesting a hard copy of the prescription for Zyprexa be left up front along with the letter she ask Dr. Gwenlyn Saran to write for her medicaid hearing. JW

## 2012-11-19 NOTE — Telephone Encounter (Signed)
Will fwd to MD.  Tyrin Herbers L, CMA  

## 2012-11-20 ENCOUNTER — Encounter: Payer: Self-pay | Admitting: Family Medicine

## 2012-11-20 DIAGNOSIS — F319 Bipolar disorder, unspecified: Secondary | ICD-10-CM

## 2012-11-20 MED ORDER — OLANZAPINE 10 MG PO TABS: 10.0000 mg | ORAL_TABLET | Freq: Every day | ORAL | Status: DC

## 2012-11-20 NOTE — Telephone Encounter (Signed)
Wrote letter and am placing at the front desk. Also printed Rx for zyprexa and am placing it at front desk for patient to pick up.  Will forward to admin staff to call patient to let her know.   Marena Chancy, PGY-3 Family Medicine Resident

## 2012-12-09 ENCOUNTER — Telehealth: Payer: Self-pay | Admitting: Family Medicine

## 2012-12-09 NOTE — Telephone Encounter (Signed)
Pt came in needing pain medicine Hydrocodone refilled.

## 2012-12-09 NOTE — Telephone Encounter (Signed)
Will fwd to MD.  Clifford Benninger L, CMA  

## 2012-12-09 NOTE — Telephone Encounter (Signed)
Is she out of her medicine? She has a follow up appointment on 12/14/12. Does she need meds before then?   Stacey Wong, PGY-3 Family Medicine Resident

## 2012-12-09 NOTE — Telephone Encounter (Signed)
lmovm for patient to call her doctors office.  Sanjiv Castorena, Darlyne Russian, CMA

## 2012-12-11 ENCOUNTER — Other Ambulatory Visit: Payer: Self-pay | Admitting: Family Medicine

## 2012-12-11 MED ORDER — HYDROCODONE-ACETAMINOPHEN 5-325 MG PO TABS: 1.0000 | ORAL_TABLET | Freq: Four times a day (QID) | ORAL | Status: DC | PRN

## 2012-12-11 NOTE — Telephone Encounter (Signed)
Med refill sent to MD Wyatt Haste, RN-BSN

## 2012-12-11 NOTE — Telephone Encounter (Signed)
Please ask patient which medication she is referring to?

## 2012-12-11 NOTE — Telephone Encounter (Signed)
Called in vicodin 5/325 q6/prn #20 tablets no refills.   Marena Chancy, PGY-3 Family Medicine Resident

## 2012-12-11 NOTE — Telephone Encounter (Signed)
It is her hydro codene--her pain medicine

## 2012-12-11 NOTE — Telephone Encounter (Signed)
Pt states she doesn't have enough pills to make it until her appt on Monday Please advise

## 2012-12-14 ENCOUNTER — Ambulatory Visit (INDEPENDENT_AMBULATORY_CARE_PROVIDER_SITE_OTHER): Payer: No Typology Code available for payment source | Admitting: Family Medicine

## 2012-12-14 VITALS — BP 108/68 | HR 79 | Temp 98.4°F | Ht 64.0 in | Wt 151.0 lb

## 2012-12-14 DIAGNOSIS — M25559 Pain in unspecified hip: Secondary | ICD-10-CM

## 2012-12-14 DIAGNOSIS — F319 Bipolar disorder, unspecified: Secondary | ICD-10-CM

## 2012-12-14 DIAGNOSIS — M25551 Pain in right hip: Secondary | ICD-10-CM

## 2012-12-14 MED ORDER — HYDROCODONE-ACETAMINOPHEN 5-325 MG PO TABS: 1.0000 | ORAL_TABLET | Freq: Four times a day (QID) | ORAL | Status: DC | PRN

## 2012-12-14 NOTE — Assessment & Plan Note (Signed)
Patient is headed to the health department right after office visit, for assistance with medications. She has yet to fill prozac, lamictal and zyprexa, which she was taking for bipolar and stopped due to lack of insurance.

## 2012-12-14 NOTE — Progress Notes (Signed)
Patient ID: Stacey Wong    DOB: 1980/06/27, 32 y.o.   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   - hip pain: unchanged from last month. Worst with movement. Difficult to walk for long periods of time. Does keep her from sleeping. Take vicodin 4 times a day with some relief. No new weakness.   - Bipolar: has not yet had a chance to get medicine from the health department. She is still having diffuclty with sleep and feeling stressed on a regular basis. No SI/HI.    ROS: see HPI Past Medical History: reviewed and updated medications and allergies. Social History: Tobacco: 0.75 pack per day  Objective: Filed Vitals:   12/14/12 1037  BP: 108/68  Pulse: 79  Temp: 98.4 F (36.9 C)    Physical Examination:   General appearance - alert, well appearing, and in no distress Chest - clear to auscultation, no wheezes, rales or rhonchi, symmetric air entry Heart - normal rate, regular rhythm, normal S1, S2, no murmurs, rubs, clicks or gallops MSK - no tenderness along spine Mild tenderness with palpation of right lateral thigh.  Reproducible pain with external and internal rotation of right hip. Decreased strength with hip flexion, knee extension and knee flexion on right compared to 5/5 on left.

## 2012-12-14 NOTE — Patient Instructions (Signed)
Follow up in 1 month   

## 2012-12-14 NOTE — Assessment & Plan Note (Signed)
Patient to go for righ hip xray. Refilled vicodin 5/325 qid/prn pain, 120 tabs, no refills. Follow up in 1 month.

## 2013-01-13 ENCOUNTER — Ambulatory Visit (INDEPENDENT_AMBULATORY_CARE_PROVIDER_SITE_OTHER): Payer: No Typology Code available for payment source | Admitting: Family Medicine

## 2013-01-13 ENCOUNTER — Encounter: Payer: Self-pay | Admitting: Family Medicine

## 2013-01-13 VITALS — BP 117/77 | HR 71 | Ht 64.0 in | Wt 150.2 lb

## 2013-01-13 DIAGNOSIS — F319 Bipolar disorder, unspecified: Secondary | ICD-10-CM

## 2013-01-13 DIAGNOSIS — R209 Unspecified disturbances of skin sensation: Secondary | ICD-10-CM

## 2013-01-13 DIAGNOSIS — R200 Anesthesia of skin: Secondary | ICD-10-CM

## 2013-01-13 DIAGNOSIS — M25559 Pain in unspecified hip: Secondary | ICD-10-CM

## 2013-01-13 DIAGNOSIS — M25551 Pain in right hip: Secondary | ICD-10-CM

## 2013-01-13 MED ORDER — HYDROCODONE-ACETAMINOPHEN 5-325 MG PO TABS: 1.0000 | ORAL_TABLET | Freq: Four times a day (QID) | ORAL | Status: DC | PRN

## 2013-01-13 NOTE — Patient Instructions (Addendum)

## 2013-01-15 NOTE — Progress Notes (Signed)
Patient ID: ROBERT SUNGA    DOB: 08/18/1980, 32 y.o.   MRN: [id number] --- Subjective:  Stacey Wong is a 32 y.o.female with h/o bipolar not currently on medicine, anxiety, chronic right hip pain who presents in much distress with numbness in both hands.   - started feeling numbness and tingling in both hands, starting in the middle finger. This occurs mostly when she wakes up and after driving. She denies any weakness in the hands and arms. Numbness only affects her hands and is more severe in right than left. It lasts up to an hour, gets better with repositioning of the wrist or shaking her hand. It has scared her to the point of thinking that she is having a stroke and she has been very distressed by this in the last 2 weeks. She denies any neck pain or   - bipolar: still not able to afford her medications. She went to the health department last week for options for more affordable medications. She continues to struggle with her mood and anxiety. Significant for feeling bad about herself, acting more active than usual, loosing interest in things and disturbed sleep. She denies any SI/HI.   - right hip pain: unchanged from previous visit.   ROS: see HPI Past Medical History: reviewed and updated medications and allergies. Social History: Tobacco: current smoker  Objective: Filed Vitals:   01/13/13 1136  BP: 117/77  Pulse: 71    Physical Examination:   General appearance - alert, anxious and tearful Chest - clear to auscultation, no wheezes, rales or rhonchi Heart - normal rate, regular rhythm, normal S1, S2, no murmurs MSK - hands: 4+/5 strength with interdigit strength bilaterally, 4+/5 grip strength bilaterally, normal sensation to light touch bilaterally, negative Tinnel's and Phallen's.  Normal distal radial pulse bilaterally NO tenderness along cervical spine, negative Spurling's bilaterally Atrophy in right quad muscle unchanged from previous, decreased strength in right hip flexion  compared to left. Pain with internal rotation of right hip.

## 2013-01-16 DIAGNOSIS — R200 Anesthesia of skin: Secondary | ICD-10-CM | POA: Insufficient documentation

## 2013-01-16 NOTE — Assessment & Plan Note (Addendum)
Although Tinnel's and Phallen's were negative, the reported symptoms are convincing for carpel tunnel. No evidence of cervical etiology causing the numbness and tingling. No drastic weakness in hands prompting need for nerve conduction study and EMG right away.  Reassured patient that it did not sound like she was having a stroke.  - Rx for cock up splint.  - if not better, consider EMG/nerve conduction although patient doesn't have insurance and such a study may be difficult to obtain as a self pay patient.

## 2013-01-16 NOTE — Assessment & Plan Note (Signed)
Refilled vicodin for chronic right pain.

## 2013-01-16 NOTE — Assessment & Plan Note (Signed)
Patient still not on her depression and bipolar meds due to cost.  Hopefully the MAP program will provide some help.

## 2013-02-01 ENCOUNTER — Other Ambulatory Visit: Payer: Self-pay | Admitting: Family Medicine

## 2013-02-11 ENCOUNTER — Encounter: Payer: Self-pay | Admitting: Family Medicine

## 2013-02-11 ENCOUNTER — Ambulatory Visit (INDEPENDENT_AMBULATORY_CARE_PROVIDER_SITE_OTHER): Payer: Medicaid Other | Admitting: Family Medicine

## 2013-02-11 VITALS — BP 122/80 | HR 77 | Temp 98.5°F | Ht 64.0 in | Wt 147.0 lb

## 2013-02-11 DIAGNOSIS — F319 Bipolar disorder, unspecified: Secondary | ICD-10-CM

## 2013-02-11 DIAGNOSIS — R200 Anesthesia of skin: Secondary | ICD-10-CM

## 2013-02-11 DIAGNOSIS — M25559 Pain in unspecified hip: Secondary | ICD-10-CM

## 2013-02-11 DIAGNOSIS — R209 Unspecified disturbances of skin sensation: Secondary | ICD-10-CM

## 2013-02-11 DIAGNOSIS — M25551 Pain in right hip: Secondary | ICD-10-CM

## 2013-02-11 MED ORDER — CYCLOBENZAPRINE HCL 10 MG PO TABS: 10.0000 mg | ORAL_TABLET | Freq: Three times a day (TID) | ORAL | Status: DC | PRN

## 2013-02-11 MED ORDER — HYDROCODONE-ACETAMINOPHEN 5-325 MG PO TABS: 1.0000 | ORAL_TABLET | Freq: Four times a day (QID) | ORAL | Status: DC | PRN

## 2013-02-11 NOTE — Patient Instructions (Signed)
I am so glad you have been able to get your medicines! Here's the schedule of how to start back on them:  For the lamictal: Take 1 tablet of 25mg  daily for weeks 1 and 2. Then increase to 2 tablets for weeks 3 and 4. Then increase to 4 tablets for week 5 onward  For the zyprexa, take one tablet of 10mg  daily  For the prozac, we will start with 20mg  daily.   If the tingling in your hands gets to getting worst, we'll xray your cervical spine.  At next visit, we'll see about xraying your hips.   Follow up in 1 month.

## 2013-02-12 NOTE — Assessment & Plan Note (Signed)
Improvements of paresthesia in left hand with cockup splints suggestive of carpal tunnel. Right hand may still have contribution from cervical spine. Strength in upper extremities is intact, which is reassuring. Continue with splints at nighttime. If starts having weakness or worsening of paresthesias, will obtain cervical spine film.

## 2013-02-12 NOTE — Progress Notes (Signed)
Patient ID: Stacey Wong    DOB: 1981-03-20, 32 y.o.   MRN: [id number] --- Subjective:  Stacey Wong is a 32 y.o.female who presents for followup. -Bipolar disorder: Patient has been able to get back on her meds: Olanzapine, fluoxetine, Lamictal. She started back one to 2 weeks ago. She feels like she is able to control her emotions better. She feels that her mood has improved. She is more functional during the day and doesn't feel like laying around the house as much as before. She still has to titrate her Lamictal dose up to the maximum dose. She doesn't feel like she is quite back to her normal self but has improved compared to when she was not on medicines. She denies any significant side effects from the medicines. She denies any SI or HI. She was recently approved for Medicaid which is how she was able to get her medications.   - Hand paresthesias: Has started wearing the cockup brace in both hands. This has completely resolved the paresthesias in the morning in the left hand. She still has some numbness and tingling in the right some mornings. She still has some numbness and tingling in the right hand after driving in a particular position. Overall she feels like it has improved. She denies any neck pain. She denies any significant weakness in her hands.  -Chronic hip pain: Pain is stable from prior colon located in the right hip. She does report some pain in the left hip area. This is intermittent and occasional. She denies any significant weakness in the lower extremities compared to prior. She has been taking Vicodin 05/325 4 times a day as needed for pain as well as Flexeril at nighttime. She would like to get her bipolar under control before considering referral back to her orthopedic surgeon for evaluation of hip replacement.   ROS: see HPI Past Medical History: reviewed and updated medications and allergies. Social History: Tobacco: Current smoker  Objective: Filed Vitals:   02/11/13 1357   BP: 122/80  Pulse: 77  Temp: 98.5 F (36.9 C)   Phq 9:22  Physical Examination:   General appearance - alert, well appearing, and in no distress Chest - clear to auscultation, no wheezes, rales or rhonchi, symmetric air entry Heart - normal rate, regular rhythm, normal S1, S2, no murmurs, rubs, clicks or gallops Hip exam-right hip: 4/5 strength with hip flexion compared to 5 out of 5 on left. Decreased strength with knee flexion and extension on the right compared to left. Reproducible pain with internal and external rotation of the right hip. Muscle spasm on the right paralumbar muscles  Mild tenderness to palpation along greater trochanter on the left side. No tenderness along cervical or lumbar spine.  Neck-normal range of motion, negative Spurling's. 5/5 strength with grip bilaterally.

## 2013-02-12 NOTE — Assessment & Plan Note (Signed)
Back on olanzapine and: Fluoxetine: Lamictal. She still needs to titrate the Lamictal up to the maximum dose. This has significantly improved her mood and her functionality. Followup in 3 weeks.

## 2013-02-12 NOTE — Assessment & Plan Note (Signed)
HEENT stable and right hip. She is now showing signs of pain in the left, unclear whether this is from overcompensation from the right. At next visit will consider getting hip plain x-rays. She will eventually need referral to the orthopedist for evaluation for hip replacement, but for now she would like to get her bipolar under better control. I think this is reasonable. Refilled Vicodin and Flexeril.

## 2013-03-04 ENCOUNTER — Other Ambulatory Visit: Payer: Self-pay | Admitting: Family Medicine

## 2013-03-09 ENCOUNTER — Encounter: Payer: Self-pay | Admitting: Family Medicine

## 2013-03-09 ENCOUNTER — Ambulatory Visit (INDEPENDENT_AMBULATORY_CARE_PROVIDER_SITE_OTHER): Payer: Medicaid Other | Admitting: Family Medicine

## 2013-03-09 VITALS — BP 126/85 | HR 80 | Temp 98.0°F | Wt 148.0 lb

## 2013-03-09 DIAGNOSIS — M25551 Pain in right hip: Secondary | ICD-10-CM

## 2013-03-09 DIAGNOSIS — R209 Unspecified disturbances of skin sensation: Secondary | ICD-10-CM

## 2013-03-09 DIAGNOSIS — G8929 Other chronic pain: Secondary | ICD-10-CM

## 2013-03-09 DIAGNOSIS — F319 Bipolar disorder, unspecified: Secondary | ICD-10-CM

## 2013-03-09 DIAGNOSIS — M25559 Pain in unspecified hip: Secondary | ICD-10-CM

## 2013-03-09 DIAGNOSIS — R202 Paresthesia of skin: Secondary | ICD-10-CM

## 2013-03-09 MED ORDER — HYDROCODONE-ACETAMINOPHEN 5-325 MG PO TABS: 1.0000 | ORAL_TABLET | Freq: Four times a day (QID) | ORAL | Status: DC | PRN

## 2013-03-09 MED ORDER — ALBUTEROL SULFATE HFA 108 (90 BASE) MCG/ACT IN AERS: 2.0000 | INHALATION_SPRAY | Freq: Four times a day (QID) | RESPIRATORY_TRACT | Status: DC | PRN

## 2013-03-09 NOTE — Patient Instructions (Signed)
We are getting xrays of your hips as well as an xray of your neck. We can discuss the results at your next visit.

## 2013-03-13 NOTE — Assessment & Plan Note (Signed)
Continue lamictal 100mg , fluoxetine and olanzapine. Monitor symptoms.

## 2013-03-13 NOTE — Assessment & Plan Note (Signed)
Would like to see extent of pathology in right hip as well as any derangements in left given start of symptoms in left hip.  Refilled vicodin and flexeril.

## 2013-03-13 NOTE — Progress Notes (Signed)
Patient ID: Stacey Wong    DOB: April 06, 1981, 32 y.o.   MRN: [id number] --- Subjective:  Stacey Wong is a 32 y.o.female who presents for follow up on medications.  - bipolar: back on full dose of lamictal for 1 week. Mood is better on lamictal, zyprexa and prozac. Feels like she has fewer days of emotional lability. Used to have symptoms several times a day. Now is having them 1-2x per week or every other day. She is sleeping 6hrs per night which is an improvement from prior. She has more interests. Her appetite is low. She still feels like she has significant symptoms of obsessive compulsive disorder where she has to have even numbers for everything.  No side effects from medications. No si/hi.   - right hip pain: unchanged from previous. Has also been having some discomfort in left hip.  No new weakness  ROS: see HPI Past Medical History: reviewed and updated medications and allergies. Social History: Tobacco: current smoker  Objective: Filed Vitals:   03/09/13 1452  BP: 126/85  Pulse: 80  Temp: 98 F (36.7 C)    Physical Examination:   General appearance - alert, well appearing, and in no distress Chest - clear to auscultation, no wheezes, rales or rhonchi, symmetric air entry Heart - normal rate, regular rhythm, normal S1, S2, no murmurs MSK - 4/5 strength in right hip flexion compared to 4+/5 on left, 1cm leg length discrepancy, not corrected with sitting. Tenderness along lateral thigh and ASIS on right.

## 2013-03-30 ENCOUNTER — Other Ambulatory Visit: Payer: Self-pay | Admitting: Family Medicine

## 2013-03-31 ENCOUNTER — Other Ambulatory Visit: Payer: Self-pay | Admitting: Family Medicine

## 2013-03-31 MED ORDER — LAMOTRIGINE 100 MG PO TABS: 100.0000 mg | ORAL_TABLET | Freq: Every day | ORAL | Status: DC

## 2013-04-01 NOTE — Telephone Encounter (Signed)
Already sent Rx for lamictal 100mg  daily.

## 2013-04-13 ENCOUNTER — Encounter: Payer: Self-pay | Admitting: Family Medicine

## 2013-04-13 ENCOUNTER — Ambulatory Visit (INDEPENDENT_AMBULATORY_CARE_PROVIDER_SITE_OTHER): Payer: Medicaid Other | Admitting: Family Medicine

## 2013-04-13 VITALS — BP 122/80 | HR 94 | Ht 64.0 in | Wt 136.0 lb

## 2013-04-13 DIAGNOSIS — M25551 Pain in right hip: Secondary | ICD-10-CM

## 2013-04-13 DIAGNOSIS — M25559 Pain in unspecified hip: Secondary | ICD-10-CM

## 2013-04-13 DIAGNOSIS — F319 Bipolar disorder, unspecified: Secondary | ICD-10-CM

## 2013-04-13 DIAGNOSIS — J45909 Unspecified asthma, uncomplicated: Secondary | ICD-10-CM

## 2013-04-13 MED ORDER — HYDROCODONE-ACETAMINOPHEN 5-325 MG PO TABS: 1.0000 | ORAL_TABLET | Freq: Four times a day (QID) | ORAL | Status: DC | PRN

## 2013-04-13 MED ORDER — OLANZAPINE 5 MG PO TABS: 5.0000 mg | ORAL_TABLET | Freq: Every day | ORAL | Status: DC

## 2013-04-13 MED ORDER — BECLOMETHASONE DIPROPIONATE 80 MCG/ACT IN AERS: 1.0000 | INHALATION_SPRAY | Freq: Two times a day (BID) | RESPIRATORY_TRACT | Status: DC

## 2013-04-13 NOTE — Patient Instructions (Signed)
Let's try using the qvar to better control your asthma.  If you get worst, come back.  For the mood, let's try increasing the zyprexa to 15mg  daily. Continue with the lamictal and the prozac as previously scheduled.

## 2013-04-14 NOTE — Progress Notes (Signed)
Patient ID: Stacey Wong    DOB: 28-May-1980, 32 y.o.   MRN: [id number] --- Subjective:  Stacey Wong is a 32 y.o.female who presents for follow up on mood and chronic right hip pain. - mood: on zyprexa 10mg , lamictal 100mg  daily, prozac 20mg . Tolerating medications without side effects. Feels like her mood is better controlled. She has times of "freak outs" where she gets very anxious once a week at the most, which is a change from having these episodes every other day prior to medications.  She is able to function better, eating, sleeping better with more interests. She just ended a 6 year relationship with her boyfriend and started another with someone new, which is creating some stress  - right hip pain: stable, no change in pain and function. Takes vicodin 4 times a day. No new weakness.   - asthma: using her albuterol more frequently than usual for the last couple of weeks. She uses it 2-3 times a day without significant improvement. She continues to smoke but doesn't feel like she can quit due to the stressors in her life currently. She has a mild cough with congestion and rhinorrhea. No fevers or chills.   ROS: see HPI Past Medical History: reviewed and updated medications and allergies. Social History: Tobacco: current smoker  Objective: Filed Vitals:   04/13/13 1459  BP: 122/80  Pulse: 94    Physical Examination:   General appearance - alert, well appearing, and in no distress Nose - erythematous and congested bilaterally Mouth - mucous membranes moist, pharynx normal without lesions Chest - scattered rhonchi and wheezing bilaterally, air movement present, normal work of breathing Heart - normal rate, regular rhythm, normal S1, S2, no murmurs Hip: pain with internal rotation of right hip, normal range of motion of left hip, no tenderness along low back or thigh or SI joint, 5/5 strength with knee extension, flexion, foot dorsiflexion and plantarflexion bilaterally, 4+/5 strength with  hip flexion bilaterally

## 2013-04-14 NOTE — Assessment & Plan Note (Signed)
Exacerbations of symptoms recently. Discussed smoking cessation but patient is not ready to quit at this time. - start controled medicine Qvar for better control - if worst symptoms, patient to return to clinic for re-evaluation and possible steroids.  - schedule albuterol every 4-6 hrs for the next 2 days.

## 2013-04-14 NOTE — Assessment & Plan Note (Signed)
Mood much improved with phq9 of 7 from 22. Patient still feels like she struggles with bursts of anxiety on a weekly basis though. Increase zyprexa to 15mg  daily. Continue lamictal and prozac at previous doses.

## 2013-04-14 NOTE — Assessment & Plan Note (Addendum)
Stable and unchanged. Function at baseline.  Refilled vicodin 5/325 qid/prn

## 2013-05-13 ENCOUNTER — Ambulatory Visit: Payer: Medicaid Other | Admitting: Family Medicine

## 2013-05-13 ENCOUNTER — Telehealth: Payer: Self-pay | Admitting: Family Medicine

## 2013-05-13 NOTE — Telephone Encounter (Signed)
Pt called and needs a refill of her pain medication left up front. jw

## 2013-05-13 NOTE — Telephone Encounter (Signed)
Will fwd to MD.  This is typically not our clinic policy.  Stacey Wong, Stacey Wong, Stacey Wong

## 2013-05-14 MED ORDER — HYDROCODONE-ACETAMINOPHEN 5-325 MG PO TABS: 1.0000 | ORAL_TABLET | Freq: Four times a day (QID) | ORAL | Status: DC | PRN

## 2013-05-14 NOTE — Telephone Encounter (Signed)
Please let patient know that we typically do not allow this, but for this time we will.  She can pick up prescription at front desk.   Liam Graham, PGY-3 Family Medicine Resident

## 2013-05-14 NOTE — Telephone Encounter (Signed)
Called pt.LMVM to call back. Please see message. Thanks. .Makylie Rivere  

## 2013-05-17 NOTE — Telephone Encounter (Signed)
Pt has appt 05/19/13 with Dr.Losq. Stacey Wong, Stacey Wong

## 2013-05-19 ENCOUNTER — Ambulatory Visit: Payer: Medicaid Other | Admitting: Family Medicine

## 2013-06-14 ENCOUNTER — Encounter: Payer: Self-pay | Admitting: Family Medicine

## 2013-06-14 ENCOUNTER — Ambulatory Visit (INDEPENDENT_AMBULATORY_CARE_PROVIDER_SITE_OTHER): Payer: Medicaid Other | Admitting: Family Medicine

## 2013-06-14 VITALS — BP 146/75 | HR 83 | Temp 98.3°F | Ht 64.0 in | Wt 145.0 lb

## 2013-06-14 DIAGNOSIS — M25551 Pain in right hip: Secondary | ICD-10-CM

## 2013-06-14 DIAGNOSIS — F3162 Bipolar disorder, current episode mixed, moderate: Secondary | ICD-10-CM

## 2013-06-14 DIAGNOSIS — F410 Panic disorder [episodic paroxysmal anxiety] without agoraphobia: Secondary | ICD-10-CM

## 2013-06-14 DIAGNOSIS — F319 Bipolar disorder, unspecified: Secondary | ICD-10-CM

## 2013-06-14 DIAGNOSIS — R2230 Localized swelling, mass and lump, unspecified upper limb: Secondary | ICD-10-CM

## 2013-06-14 DIAGNOSIS — M25559 Pain in unspecified hip: Secondary | ICD-10-CM

## 2013-06-14 DIAGNOSIS — R229 Localized swelling, mass and lump, unspecified: Secondary | ICD-10-CM

## 2013-06-14 MED ORDER — HYDROCODONE-ACETAMINOPHEN 5-325 MG PO TABS: 1.0000 | ORAL_TABLET | Freq: Four times a day (QID) | ORAL | Status: DC | PRN

## 2013-06-14 MED ORDER — CYCLOBENZAPRINE HCL 10 MG PO TABS: 10.0000 mg | ORAL_TABLET | Freq: Three times a day (TID) | ORAL | Status: DC | PRN

## 2013-06-14 MED ORDER — FLUOXETINE HCL 40 MG PO CAPS: 40.0000 mg | ORAL_CAPSULE | Freq: Every day | ORAL | Status: DC

## 2013-06-14 NOTE — Patient Instructions (Signed)
Let's increase the prozac to 40mg  daily.  Make sure you are taking the zyprexa 15mg  daily.   Follow up in 1 month.

## 2013-06-15 NOTE — Assessment & Plan Note (Signed)
Refilled vicodin.  Will ask at next visit, if patient is ready for referral back to ortho for hip replacement eval.

## 2013-06-15 NOTE — Progress Notes (Addendum)
Patient ID: ARLESIA KIEL    DOB: 07/10/1980, 33 y.o.   MRN: [id number] --- Subjective:  Stacey Wong is a 33 y.o.female who presents for follow up on bipolar treatment and for refill of chronic pain medications for right hip dysplasia.  - bipolar and panic attacks: patient reports panic attacks once every week and a half or so. Panic attacks are not triggered, occur out of the blue. She feels anxious and frightened during episodes. She tries to calm herself down which on occasion does help. She states that the severity of the episodes is less than it used to be before medicine. She states that she still has days where she just wants to stay in bed, about once or twice a week.  She is taking lamictal 100mg  daily, zyprexa 15mg  daily and proac 20mg  daily. She denies any missed doses and no side effects.  phq 9 in office is 7. Negative SI/HI  - right hip pain: unchanged from previous. Worst with long time standing. Pain with laying on the right side. No worsening weakness. Some discomfort in left side.   - nodule on base of left middle finger. Noticed it 2 weeks. Not growing in size. Some pain when pressing on it. Able to move finger. No trauma to the area. No swelling or warmth. No fever.   ROS: see HPI Past Medical History: reviewed and updated medications and allergies. Social History: Tobacco: current smoker  Objective: Filed Vitals:   06/14/13 1015  BP: 146/75  Pulse: 83  Temp: 98.3 F (36.8 C)    Physical Examination:   General appearance - alert, well appearing, and in no distress Chest - clear to auscultation, no wheezes, rales or rhonchi, symmetric air entry Heart - normal rate, regular rhythm, normal S1, S2, no murmurs Hip: pain with internal rotation of right hip, normal range of motion of left hip, no tenderness along low back or thigh or SI joint, 4+/5 strength with knee extension, flexion, foot dorsiflexion and plantarflexion bilaterally, 4/5 strength with hip flexion on right  compared to 4+/5 on left.  Right thigh smaller in size than left  Left finger: 0.5cm nodule at MCP of left 3rd finger, not very mobile. Mildly tender. No erythema, no swelling. Normal range of motion of finger at MCP

## 2013-06-15 NOTE — Assessment & Plan Note (Signed)
Will increase fluoxetine to 40mg  daily.  Continue zyprexa 15mg  daily and lamictal to 100mg  daily.

## 2013-06-15 NOTE — Assessment & Plan Note (Signed)
Improved since on medication for bipolar, but still present.  Will increase prozac to 40mg  daily from 20mg  daily.  If this doesn't significantly help, will approach patient about possibility of therapy as adjunct to medication. Patient has been seen by Dr. Gwenlyn Saran in the past and I'd like to see if she would be amenable to this again.

## 2013-06-16 DIAGNOSIS — M6740 Ganglion, unspecified site: Secondary | ICD-10-CM | POA: Insufficient documentation

## 2013-06-16 NOTE — Assessment & Plan Note (Addendum)
Nodule at level of MCP on left 3rd finger, likely ganglion cyst.  Xray to rule out any bone growth since nodule was a little less mobile than expected.  If continues to cause trouble, refer to sports medicine for possible US guided aspiration.

## 2013-07-05 ENCOUNTER — Ambulatory Visit (INDEPENDENT_AMBULATORY_CARE_PROVIDER_SITE_OTHER): Payer: Medicaid Other | Admitting: Family Medicine

## 2013-07-05 ENCOUNTER — Encounter: Payer: Self-pay | Admitting: Family Medicine

## 2013-07-05 VITALS — BP 110/68 | HR 76 | Temp 98.2°F | Ht 64.0 in | Wt 150.0 lb

## 2013-07-05 DIAGNOSIS — M25559 Pain in unspecified hip: Secondary | ICD-10-CM

## 2013-07-05 DIAGNOSIS — M25551 Pain in right hip: Secondary | ICD-10-CM

## 2013-07-05 DIAGNOSIS — F319 Bipolar disorder, unspecified: Secondary | ICD-10-CM

## 2013-07-05 MED ORDER — HYDROCODONE-ACETAMINOPHEN 5-325 MG PO TABS: 1.0000 | ORAL_TABLET | Freq: Four times a day (QID) | ORAL | Status: DC | PRN

## 2013-07-05 NOTE — Patient Instructions (Signed)
Follow up with me in 1 month  I recommend that you meet with our psychologist, Dr. Zella Ball, for help dealing with your anxiety.  You can schedule an appointment with her by calling her directly at [phone].

## 2013-07-07 NOTE — Progress Notes (Signed)
Patient ID: [name]    DOB: [birth year], 33 y.o.   MRN: [id number] --- Subjective:  Stacey Wong is a 33 y.o.female who presents for follow up on bipolar and anxiety.  - Anxiety and bipolar:  Current meds: lamictal 100mg  daily, zyprexa 15mg  daily and prozac 40mg  daily.  Prozac was increased at last visit 3-4 weeks ago. She has tolerated it without side effects.  She feels like she still has episodes of anxiety that she describes as episodes where she gets upset and feeling of not being able to control her emotions. This has been triggered recently by her sister's friend staying at the house where she lives. Per patient's opinion, this friend and her sister contributed to the breakup of patient's previous marriage.  She copes with it by removing herself from the situation and going to her room.  Otherwise, she feels like her overall symptoms have improved since she started back on the medication.   PHQ9: 10 very difficult   - right hip pain: stable, unchanged from previous.   ROS: see HPI Past Medical History: reviewed and updated medications and allergies. Social History: Tobacco: current every day smoker  Objective: Filed Vitals:   07/05/13 1418  BP: 110/68  Pulse: 76  Temp: 98.2 F (36.8 C)    Physical Examination:   General appearance - alert, well appearing, and in no distress Heart - normal rate, regular rhythm, normal S1, S2, no murmurs, rubs, clicks or gallops Pulm - CTA b/l MSK - 4+/5 strength with knee flexion, extension, hip flexion, foot dorsiflexion and plantarflexion bilaterally.

## 2013-07-07 NOTE — Assessment & Plan Note (Signed)
Refilled vicodin.  

## 2013-07-07 NOTE — Assessment & Plan Note (Signed)
Some of the anxiety likely related to bipolar.  Other component also appears situational.  - borached subject of therapy with patient and she is interested in giving it a try. Gave her number to call Dr. Gwenlyn Saran - continue prozac 40, zyprexa 15mg  and lamictal 100mg  daily. If not better after therapy sessions, consider increasing lamictal.

## 2013-07-21 ENCOUNTER — Telehealth: Payer: Self-pay | Admitting: Family Medicine

## 2013-07-21 ENCOUNTER — Ambulatory Visit
Admission: RE | Admit: 2013-07-21 | Discharge: 2013-07-21 | Disposition: A | Discharge status: Home / Self Care | Payer: Medicaid Other | Source: Ambulatory Visit | Attending: Family Medicine | Admitting: Family Medicine

## 2013-07-21 DIAGNOSIS — R2230 Localized swelling, mass and lump, unspecified upper limb: Secondary | ICD-10-CM

## 2013-07-21 NOTE — Telephone Encounter (Signed)
Tried calling the patient to let her know that hand xray was normal.  Please let patient know.   Liam Graham, PGY-3 Family Medicine Resident

## 2013-07-22 NOTE — Telephone Encounter (Signed)
LMVM for patient to please return call.  See MD message below.  Paige Monarrez, Loralyn [place], [place]

## 2013-07-23 NOTE — Telephone Encounter (Signed)
Attempted to reach patient x 3.  If patient calls back, please advise of MD message below.  Dameion Briles, Loralyn Freshwater, [address]

## 2013-08-01 ENCOUNTER — Other Ambulatory Visit: Payer: Self-pay | Admitting: Family Medicine

## 2013-08-05 ENCOUNTER — Ambulatory Visit (INDEPENDENT_AMBULATORY_CARE_PROVIDER_SITE_OTHER): Payer: Self-pay | Admitting: Family Medicine

## 2013-08-05 ENCOUNTER — Encounter: Payer: Self-pay | Admitting: Family Medicine

## 2013-08-05 VITALS — BP 113/74 | HR 74 | Ht 64.0 in | Wt 156.0 lb

## 2013-08-05 DIAGNOSIS — R229 Localized swelling, mass and lump, unspecified: Secondary | ICD-10-CM

## 2013-08-05 DIAGNOSIS — R2230 Localized swelling, mass and lump, unspecified upper limb: Secondary | ICD-10-CM

## 2013-08-05 DIAGNOSIS — M6740 Ganglion, unspecified site: Secondary | ICD-10-CM

## 2013-08-05 DIAGNOSIS — M25551 Pain in right hip: Secondary | ICD-10-CM

## 2013-08-05 DIAGNOSIS — F319 Bipolar disorder, unspecified: Secondary | ICD-10-CM

## 2013-08-05 DIAGNOSIS — M25559 Pain in unspecified hip: Secondary | ICD-10-CM

## 2013-08-05 MED ORDER — HYDROCODONE-ACETAMINOPHEN 5-325 MG PO TABS: 1.0000 | ORAL_TABLET | Freq: Four times a day (QID) | ORAL | Status: DC | PRN

## 2013-08-05 MED ORDER — LAMOTRIGINE 200 MG PO TABS: 200.0000 mg | ORAL_TABLET | Freq: Every day | ORAL | Status: DC

## 2013-08-06 NOTE — Progress Notes (Signed)
Patient ID: GISSELL BARRA    DOB: December 06, 1980, 33 y.o.   MRN: [id number] --- Subjective:  Connee is a 33 y.o.female with bipolar 1 disorder and congenital dysplasia of the right hip who presents for follow up on depression and anxiety.  - Bipolar: Currently on zyprexa 15mg , prozac 40mg  daily and lamictal 100mg  daily Tolerability:  - side effects: none - missed doses in 1 week: none  Safety:  - HY:QMVH - HI: none  Efficacy: - mood improvement: fewer episodes of "panic attacks". Her sister's friend has moved out of the house which has helped with her stress level. She continues to have episodes of anxiety 2-3 times per week, improved from daily while off of medicine.   - nodule on left middle finger: patient feels like it continues to grow and cause discomfort and some numbness at the tip of the finger.   - congenital hip dysplasia: continues to have pain in the right hip. Unchanged from previous. Taking vicodin 5/325 four times daily.    ROS: see HPI Past Medical History: reviewed and updated medications and allergies. Social History: Tobacco: current every day smoker  Objective: Filed Vitals:   08/05/13 1105  BP: 113/74  Pulse: 74    Physical Examination:   General appearance - alert, well appearing, and in no distress Chest - clear to auscultation, no wheezes, rales or rhonchi, symmetric air entry Heart - normal rate, regular rhythm, normal S1, S2, no murmurs MSK - 4+/5 strength with knee flexion, extension, hip flexion, foot dorsiflexion and plantarflexion bilaterally.  Pain with hip external rotation on right No tenderness to palpation along spine or paraspinal muscles.  Left middle finger: palpable nodule less than 1cm in diameter at MCP joint. Mildly tender. Normal range of motion at MCP joint.

## 2013-08-06 NOTE — Assessment & Plan Note (Signed)
Currently on zyprexa 15mg , prozac 40mg  daily and lamictal 100mg  daily.  - increase lamictal to 200mg  daily - encouraged her to call Dr. Gwenlyn Saran for information about counseling whenever she is ready to do so - follow up in 1 month

## 2013-08-06 NOTE — Assessment & Plan Note (Signed)
Refilled vicodin.  - patient will eventually need to get surgery for this but she is not yet ready.

## 2013-08-06 NOTE — Assessment & Plan Note (Signed)
Continues to grow and cause discomfort.  - refer to orthopedic surgery for evaluation and treatment

## 2013-08-13 ENCOUNTER — Telehealth: Payer: Self-pay | Admitting: Family Medicine

## 2013-08-13 DIAGNOSIS — R2230 Localized swelling, mass and lump, unspecified upper limb: Secondary | ICD-10-CM

## 2013-08-13 NOTE — Telephone Encounter (Signed)
Please call patient to let her know that we were unable to process her medicaid for the orthopedic referral and that she needs to check on that with medicaid.   In the meantime, for the finger nodule, would like to get an ultrasound of the area to better define it. Will also add referral to sports medicine but if her medicaid is not approved she may need to pay the bill for the visit out of pocket.   Please arrange for ultrasound for patient.   Thank you very much!  Liam Graham, PGY-3 Family Medicine Resident

## 2013-08-16 NOTE — Telephone Encounter (Signed)
Left message for patient to return call, please read message from Dr Otis Dials.Stacey Wong

## 2013-08-20 NOTE — Telephone Encounter (Signed)
Called patient and left message asking her to call us back regarding the orthopedic referral not being able to go through due to trouble with her Medicaid coverage. Also asked her to call back so that we can schedule an ultrasound of her finger to take a look at the nodule, waiting for the ortho appointment.   When patient calls back, please let her know the following:  for the finger nodule, would like to get an ultrasound of the area to better define it. Will also add referral to sports medicine but if her medicaid is not approved she may need to pay the bill for the visit out of pocket.    Liam Graham, PGY-3 Family Medicine Resident

## 2013-09-03 ENCOUNTER — Other Ambulatory Visit: Payer: Self-pay | Admitting: Family Medicine

## 2013-09-06 ENCOUNTER — Ambulatory Visit: Payer: Medicaid Other | Admitting: Family Medicine

## 2013-09-08 ENCOUNTER — Encounter: Payer: Self-pay | Admitting: Family Medicine

## 2013-09-08 ENCOUNTER — Ambulatory Visit (INDEPENDENT_AMBULATORY_CARE_PROVIDER_SITE_OTHER): Payer: Medicaid Other | Admitting: Family Medicine

## 2013-09-08 VITALS — BP 130/83 | HR 84 | Ht 64.0 in | Wt 160.0 lb

## 2013-09-08 DIAGNOSIS — F319 Bipolar disorder, unspecified: Secondary | ICD-10-CM

## 2013-09-08 DIAGNOSIS — R2230 Localized swelling, mass and lump, unspecified upper limb: Secondary | ICD-10-CM

## 2013-09-08 DIAGNOSIS — R229 Localized swelling, mass and lump, unspecified: Secondary | ICD-10-CM

## 2013-09-08 DIAGNOSIS — M549 Dorsalgia, unspecified: Secondary | ICD-10-CM

## 2013-09-08 DIAGNOSIS — F3162 Bipolar disorder, current episode mixed, moderate: Secondary | ICD-10-CM

## 2013-09-08 DIAGNOSIS — M6740 Ganglion, unspecified site: Secondary | ICD-10-CM

## 2013-09-08 MED ORDER — MELOXICAM 15 MG PO TABS: 15.0000 mg | ORAL_TABLET | Freq: Every day | ORAL | Status: DC

## 2013-09-08 MED ORDER — FLUOXETINE HCL 40 MG PO CAPS: 40.0000 mg | ORAL_CAPSULE | Freq: Every day | ORAL | Status: DC

## 2013-09-08 MED ORDER — LAMOTRIGINE 25 MG PO TBDP: 25.0000 mg | ORAL_TABLET | Freq: Every day | ORAL | Status: DC

## 2013-09-08 MED ORDER — HYDROCODONE-ACETAMINOPHEN 5-325 MG PO TABS: 1.0000 | ORAL_TABLET | Freq: Four times a day (QID) | ORAL | Status: DC | PRN

## 2013-09-08 MED ORDER — ALBUTEROL SULFATE HFA 108 (90 BASE) MCG/ACT IN AERS: 2.0000 | INHALATION_SPRAY | Freq: Four times a day (QID) | RESPIRATORY_TRACT | Status: DC | PRN

## 2013-09-08 NOTE — Patient Instructions (Signed)
For the lamictal, increase the dose as follows:  Weeks 1 and 2: 25 mg once daily; Weeks 3 and 4: 50 mg once daily; Week 5: 100 mg once daily; Week 6 and maintenance: 200 mg once daily   For the finger, we are going to get an ultrasound of the finger.   For the back pain, start taking meloxicam daily with food.

## 2013-09-09 NOTE — Assessment & Plan Note (Signed)
Currently not on treatment due to loss of coverage. Stable symptoms for now. No SI/HI - resume meds as soon as she gets coverage: restart lamictal by taper - follow up in 1 month

## 2013-09-09 NOTE — Assessment & Plan Note (Signed)
Likely increased muscle spasm  - continue flexeril and vicodin - add meloxicam as anti-infammatory

## 2013-09-09 NOTE — Progress Notes (Signed)
Patient ID: Stacey Wong    DOB: 1980-10-30, 33 y.o.   MRN: [id number] --- Subjective:  Stacey Wong is a 33 y.o.female who presents for follow up on chronic right hip pain from dysplasia as well as follow up on bipolar. Concerns include the following:  - bipolar: ran out of medicaid recently and has not been able to fill her prozac, lamictal and zyprexa for the last couple of weeks. She states that she has been able to cope with it especially since she doesn't have any stressors at this time.  - low back pain: started 2 weeks ago, with increased spasm of lower back , lasted 2 days at a time, resolved and recurred 4 days after. Pain centered in lower back, difficult to sit and move around. She has been taking the flexeril which has helped with the pain.  Her right hip pain is stable compared to prior.  - nodule at base of left middle finger: patient feels like it is getting bigger in size. Painful only when she pushes on it repeatedly. Some numbness at the tip of the finger as well as the tip of the 4th.   ROS: see HPI Past Medical History: reviewed and updated medications and allergies. Social History: Tobacco: current smoker  Objective: Filed Vitals:   09/08/13 1141  BP: 130/83  Pulse: 84    Physical Examination:   General appearance - alert, well appearing, and in no distress Chest - clear to auscultation, no wheezes, rales or rhonchi, symmetric air entry Heart - normal rate, regular rhythm, normal S1, S2, no murmurs Back - discomfort to palpation along lower loumbar area, normal strength in lower extremities bilaterally, 3+ reflexes bilaterally Finger: fibrous, irregular area (less than 1cm) at base of left middle finger

## 2013-09-09 NOTE — Assessment & Plan Note (Signed)
Unclear whether this is true ganglion cyst. Appears like fibrous tissue.  Will get ultrasound to better evaluate.  Appears stable in size since last evaluation

## 2013-09-22 ENCOUNTER — Ambulatory Visit
Admission: RE | Admit: 2013-09-22 | Discharge: 2013-09-22 | Disposition: A | Discharge status: Home / Self Care | Payer: Medicaid Other | Source: Ambulatory Visit | Attending: Family Medicine | Admitting: Family Medicine

## 2013-09-22 DIAGNOSIS — R2230 Localized swelling, mass and lump, unspecified upper limb: Secondary | ICD-10-CM

## 2013-10-15 ENCOUNTER — Ambulatory Visit (INDEPENDENT_AMBULATORY_CARE_PROVIDER_SITE_OTHER): Payer: Medicaid Other | Admitting: Family Medicine

## 2013-10-15 VITALS — BP 107/71 | HR 74 | Temp 97.5°F | Wt 161.4 lb

## 2013-10-15 DIAGNOSIS — M25559 Pain in unspecified hip: Secondary | ICD-10-CM

## 2013-10-15 DIAGNOSIS — M25551 Pain in right hip: Secondary | ICD-10-CM

## 2013-10-15 DIAGNOSIS — F3162 Bipolar disorder, current episode mixed, moderate: Secondary | ICD-10-CM

## 2013-10-15 DIAGNOSIS — M6740 Ganglion, unspecified site: Secondary | ICD-10-CM

## 2013-10-15 DIAGNOSIS — F319 Bipolar disorder, unspecified: Secondary | ICD-10-CM

## 2013-10-15 MED ORDER — LAMOTRIGINE 100 MG PO TABS: 200.0000 mg | ORAL_TABLET | Freq: Every day | ORAL | Status: DC

## 2013-10-15 MED ORDER — FLUOXETINE HCL 40 MG PO CAPS: 40.0000 mg | ORAL_CAPSULE | Freq: Every day | ORAL | Status: DC

## 2013-10-15 MED ORDER — CYCLOBENZAPRINE HCL 10 MG PO TABS: 10.0000 mg | ORAL_TABLET | Freq: Three times a day (TID) | ORAL | Status: DC | PRN

## 2013-10-15 MED ORDER — ALBUTEROL SULFATE HFA 108 (90 BASE) MCG/ACT IN AERS: 2.0000 | INHALATION_SPRAY | Freq: Four times a day (QID) | RESPIRATORY_TRACT | Status: DC | PRN

## 2013-10-15 MED ORDER — OLANZAPINE 10 MG PO TABS: ORAL_TABLET | ORAL | Status: DC

## 2013-10-15 MED ORDER — HYDROCODONE-ACETAMINOPHEN 5-325 MG PO TABS: 1.0000 | ORAL_TABLET | Freq: Four times a day (QID) | ORAL | Status: DC | PRN

## 2013-10-15 MED ORDER — MELOXICAM 15 MG PO TABS: 15.0000 mg | ORAL_TABLET | Freq: Every day | ORAL | Status: DC

## 2013-10-15 NOTE — Patient Instructions (Signed)
For the lamictal: continue 50mg  for this week and next week, which will be week 4. Then for Week 5: 100 mg once daily; Week 6 and maintenance: 200 mg once daily   Follow up in 1 month

## 2013-10-18 ENCOUNTER — Encounter: Payer: Self-pay | Admitting: Family Medicine

## 2013-10-18 NOTE — Assessment & Plan Note (Signed)
Continue prozac 40mg  and zyprexa 10mg .  Increase lamictal as states in AVS for goal of 200mg  daily.  Follow up in 1 month

## 2013-10-18 NOTE — Progress Notes (Signed)
Patient ID: Stacey Wong    DOB: 08/10/1980, 33 y.o.   MRN: [id number] --- Subjective:  Stacey Wong is a 33 y.o.female with h/o right congenital hip dysplasia, bipolar who presents for follow up.  - Bipolar: restarted her medications since last month after re-obtaining her health insurance. She has been taking zyprexa 10mg  daily, prozac 40mg  daily and titrating lamictal. Currently she is on her first week of taking lamictal 50mg  daily. She denies any side effects. She reports her mood as fair. She still lacks interest in doing some things but feels like overall things are getting better now that she is on medication. She denies any recent stressors.  Denies any SI/HI. Denies any alcohol or drug use.   - right hip and back pain: unchanged from prior. Worst with walking and prolonged standing. Difficult to do house chores secondary to pain. Pain is 6/10. Denies any numbness or tingling in lower ext. No sharp shooting pain down leg. No weakness of lower ext. No urine or bowel incontinence.   - left hand nodule at base of middle finger. Continues to bother her. She reports tingling and numbness at tip of finger. [place] was obtained which showed hypoechoic structure, probably gaglion cyst but MRI to be considered for more sensitive assessment.   ROS: see HPI Past Medical History: reviewed and updated medications and allergies. Social History: Tobacco: current smoker  Objective: Filed Vitals:   10/15/13 1140  BP: 107/71  Pulse: 74  Temp: 97.5 F (36.4 C)    Physical Examination:   General appearance - alert, well appearing, and in no distress Chest -  scattered exp wheezing, no crackles, normal work of breathing Heart - normal rate, regular rhythm, normal S1, S2, no murmurs, rubs, clicks or gallops Back - discomfort to palpation along right paralumbar muscles, normal strength in lower extremities bilaterally, pain in hip with external rotation of right hip.  3+ reflexes bilaterally  Finger: fibrous,  irregular area (less than 1cm) at base of left middle finger, unchanged in size from previous

## 2013-10-18 NOTE — Assessment & Plan Note (Signed)
Refer to ortho for evaluation

## 2013-10-18 NOTE — Assessment & Plan Note (Signed)
-  refilled vicodin and flexeril - recommended that she start thinking about possibility of hip replacement but patient would like to wait until bipolar medicine is optimized.

## 2013-11-10 ENCOUNTER — Ambulatory Visit (INDEPENDENT_AMBULATORY_CARE_PROVIDER_SITE_OTHER): Payer: Medicaid Other | Admitting: Family Medicine

## 2013-11-10 ENCOUNTER — Encounter: Payer: Self-pay | Admitting: Family Medicine

## 2013-11-10 VITALS — BP 125/82 | HR 90 | Temp 98.0°F | Ht 64.0 in | Wt 161.0 lb

## 2013-11-10 DIAGNOSIS — F319 Bipolar disorder, unspecified: Secondary | ICD-10-CM

## 2013-11-10 DIAGNOSIS — M25551 Pain in right hip: Secondary | ICD-10-CM

## 2013-11-10 DIAGNOSIS — Z0000 Encounter for general adult medical examination without abnormal findings: Secondary | ICD-10-CM | POA: Insufficient documentation

## 2013-11-10 DIAGNOSIS — M25559 Pain in unspecified hip: Secondary | ICD-10-CM

## 2013-11-10 DIAGNOSIS — M6740 Ganglion, unspecified site: Secondary | ICD-10-CM

## 2013-11-10 MED ORDER — MELOXICAM 15 MG PO TABS: 15.0000 mg | ORAL_TABLET | Freq: Every day | ORAL | Status: DC

## 2013-11-10 MED ORDER — BECLOMETHASONE DIPROPIONATE 80 MCG/ACT IN AERS: 1.0000 | INHALATION_SPRAY | Freq: Two times a day (BID) | RESPIRATORY_TRACT | Status: DC

## 2013-11-10 MED ORDER — HYDROCODONE-ACETAMINOPHEN 5-325 MG PO TABS: 1.0000 | ORAL_TABLET | Freq: Four times a day (QID) | ORAL | Status: DC | PRN

## 2013-11-10 MED ORDER — CYCLOBENZAPRINE HCL 10 MG PO TABS: ORAL_TABLET | ORAL | Status: DC

## 2013-11-10 NOTE — Assessment & Plan Note (Signed)
Patient's last pap smear was 03/2010 and negative. Will follow up with me in the next few months for a well woman exam with pap smear

## 2013-11-10 NOTE — Assessment & Plan Note (Signed)
Currently appears unchanged from previous notes, however this is the first time I have seen this pt. Will refer to ortho.

## 2013-11-10 NOTE — Assessment & Plan Note (Signed)
Will refill flexeril and Vicodin for the patient. Currently stable. Was unable to find shoe inserts in clinic today. Suggested she speak with ortho about her hip dysplasia to gain options, although I feel that her only option at this time would be replacement. Will return to clinic in 1 month for refills.

## 2013-11-10 NOTE — Assessment & Plan Note (Signed)
From the patient's history and from looking at the EMR, I am unsure whether the pt truly has bipolar disorder. A note from Dr. Gwenlyn Saran notes panic disorder and EtOH abuse with no mention of bipolar disorder. Once patient more medically stable and I have had more opportunities to speak with her, I will investigate this further as Zyprexa and Lamictal are not without side effects. Consider re-evaluation by Dr. Gwenlyn Saran in the future as well.

## 2013-11-10 NOTE — Patient Instructions (Signed)
You presented for a routine follow up. I will refer you to orthopaedics for the cyst on your left hand. Consider broaching the subject of your hip pain for options during that visit. Please follow up with me at my next available appointment when it is convenient for you for a well woman exam.

## 2013-11-10 NOTE — Progress Notes (Signed)
[facility]  Archie Patten, MD  Subjective:  Chief complaint: Refill and nodule on left hand  Patient presents requesting refills and complaints of a nodule on the middle finger of her left hand.  Nodule: Patient notes it has been there for over a year and is getting progressively larger and bothersome. It is at the base of her middle finger on her left hand. No pain or interference with function. She's never noted any erythema or drainage from the area. An ultrasound showed an hypoechoic structure consistent with a ganglion cyst. Per EMR, Dr. Otis Dials was going to refer her to ortho for this but the patient never heard anything.   Right congential hip dysplasia: Patient currently taking Vicodin and flexeril (she gets lower back spasms) for the pain. Notes it is a 7/10 without medication and a 5/10 with medication. Requires refills today. Has considered hip replacement but would like to get stable on her bipolar medications prior to doing this. Request shoe inserts if we have them in clinic today.  Bipolar disorder: States her "highs" are "feeling on top of the world." Denies any reckless behavior such as increased spending, drug abuse, or hypersexuality. She does stay awake "for days" but notes she normally watches TV during this time- no goal oriented activities. 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 Her "lows" are feeling depressed and worthless, however denies SI/HI. No auditory/visual hallucinations. Was off medication a few months due to insurance issues, but has been back to her normal regimen for the last week. Currently maintained on Prozac 40mg , Zyprexa 10mg , and Lamictal 200mg . No noted side effects.  ROS- Denies chest pain, SOB, urinary symptoms, or constipation/diarrhea.  Past Medical History Patient Active Problem List   Diagnosis Date Noted  . Healthcare maintenance 11/10/2013  . Ganglion cyst 06/16/2013  . Numbness and tingling in hands 01/16/2013  .  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 HTN (hypertension) 06/20/2011  . Right hip pain 05/27/2011  . Back pain 05/09/2011  . Panic disorder 09/13/2010  . History of alcohol abuse 05/16/2010  . 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 GERD 05/03/2010  . SHOULDER PAIN, RIGHT, CHRONIC 04/10/2010  . Tobacco abuse counseling 01/14/2009  . BIPOLAR AFFECTIVE DISORDER 05/11/2007  . AMENORRHEA 05/11/2007    Medications- reviewed and updated Current Outpatient Prescriptions  Medication Sig Dispense Refill  . albuterol (PROVENTIL HFA;VENTOLIN HFA) 108 (90 BASE) MCG/ACT inhaler Inhale 2 puffs into the lungs every 6 (six) hours as needed. For shortness of breath.  1 Inhaler  3  . beclomethasone (QVAR) 80 MCG/ACT inhaler Inhale 1 puff into the lungs 2 (two) times daily.  1 Inhaler  12  . cyclobenzaprine (FLEXERIL) 10 MG tablet TAKE 1 TABLET BY MOUTH THREE TIMES DAILY AS NEEDED FOR SPASMS  60 tablet  0  . FLUoxetine (PROZAC) 40 MG capsule Take 1 capsule (40 mg total) by mouth daily.  90 capsule  3  . HYDROcodone-acetaminophen (NORCO) 5-325 MG per tablet Take 1 tablet by mouth 4 (four) times daily as needed.  120 tablet  0  . lamoTRIgine (LAMICTAL) 100 MG tablet Take 2 tablets (200 mg total) by mouth daily.  60 tablet  2  . meloxicam (MOBIC) 15 MG tablet Take 1 tablet (15 mg total) by mouth daily.  30 tablet  0  . OLANZapine (ZYPREXA) 10 MG tablet TAKE 1 TABLET BY MOUTH AT BEDTIME  30 tablet  0  . hydrochlorothiazide (HYDRODIURIL) 25 MG tablet Take 1 tablet (25 mg total)  by mouth daily.  30 tablet  3   No current facility-administered medications for this visit.    Objective: BP 125/82  Pulse 90  Temp(Src) 98 F (36.7 C) (Oral)  Ht 5\' 4"  (1.626 m)  Wt 161 lb (73.029 kg)  BMI 27.62 kg/m2  LMP 10/21/2013 Gen: No acute distress. Alert, cooperative with exam HEENT: Atraumatic, EOMI, PERRLA, Oropharynx clear. MMM CV: RRR. No murmurs, rubs, or gallops noted. 2+ radial and DP pulses bilaterally. Resp: CTAB.  No wheezing, crackles, or rhonchi noted. Abd: +BS. Soft, non-distended, non-tender. No rebound or guarding.  Ext: No edema. There's a fibrous irregularly shaped nodule that is minimally mobile at the base of the left middle finger. Non-tender to palpation with no erythema or drainage noted. Pain with external rotation of the right hip. Minimal pain with hip flexion and extension on the right. 5/5 strength in the upper and lower extremities bilaterally. Neuro: Alert and oriented, No gross focal deficits  Assessment/Plan:  Ganglion cyst Currently appears unchanged from previous notes, however this is the first time I have seen this pt. Will refer to ortho.   Right hip pain Will refill flexeril and Vicodin for the patient. Currently stable. Was unable to find shoe inserts in clinic today. Suggested she speak with ortho about her hip dysplasia to gain options, although I feel that her only option at this time would be replacement. Will return to clinic in 1 month for refills.  BIPOLAR AFFECTIVE DISORDER From the patient's history and from looking at the EMR, I am unsure whether the pt truly has bipolar disorder. A note from Dr. Gwenlyn Saran notes panic disorder and EtOH abuse with no mention of bipolar disorder. Once patient more medically stable and I have had more opportunities to speak with her, I will investigate this further as Zyprexa and Lamictal are not without side effects. Consider re-evaluation by Dr. Gwenlyn Saran in the future as well.   Healthcare maintenance Patient's last pap smear was 03/2010 and negative. Will follow up with me in the next few months for a well woman exam with pap smear    Orders Placed This Encounter  Procedures  . Ambulatory referral to Orthopedic Surgery    Referral Priority:  Routine    Referral Type:  Surgical    Referral Reason:  Specialty Services Required    Requested Specialty:  Orthopedic Surgery    Number of Visits Requested:  1    Meds ordered this encounter   Medications  . beclomethasone (QVAR) 80 MCG/ACT inhaler    Sig: Inhale 1 puff into the lungs 2 (two) times daily.    Dispense:  1 Inhaler    Refill:  12  . cyclobenzaprine (FLEXERIL) 10 MG tablet    Sig: TAKE 1 TABLET BY MOUTH THREE TIMES DAILY AS NEEDED FOR SPASMS    Dispense:  60 tablet    Refill:  0  . HYDROcodone-acetaminophen (NORCO) 5-325 MG per tablet    Sig: Take 1 tablet by mouth 4 (four) times daily as needed.    Dispense:  120 tablet    Refill:  0  . meloxicam (MOBIC) 15 MG tablet    Sig: Take 1 tablet (15 mg total) by mouth daily.    Dispense:  30 tablet    Refill:  0

## 2013-11-11 NOTE — Progress Notes (Addendum)
Patient ID: Stacey Wong, female   DOB: 01/30/1981, 33 y.o.   MRN: [id number] Discussed with Dr. Lorenso Courier during the visit.  Agree with her documentation and management.

## 2013-12-09 ENCOUNTER — Telehealth: Payer: Self-pay | Admitting: Family Medicine

## 2013-12-09 NOTE — Telephone Encounter (Signed)
Pt's hydrocodone RX will need to be refilled by 8/15 but pt could not get an appt with Dr. Lorenso Courier until 8/17. Pt requesting RX for meds to last her until 8/17. Please advise.

## 2013-12-10 ENCOUNTER — Telehealth: Payer: Self-pay | Admitting: Family Medicine

## 2013-12-10 MED ORDER — HYDROCODONE-ACETAMINOPHEN 5-325 MG PO TABS: 1.0000 | ORAL_TABLET | Freq: Four times a day (QID) | ORAL | Status: DC | PRN

## 2013-12-10 NOTE — Telephone Encounter (Signed)
Please let the patient know her Rx is waiting for her at the front desk and I look forward to seeing her at our next appt.   Thanks, [employer]

## 2013-12-10 NOTE — Telephone Encounter (Signed)
Pt inform abotu RX being up front. Devon Pretty CMA

## 2013-12-10 NOTE — Telephone Encounter (Signed)
Pt is calling back to check the status of her refill request. jw

## 2013-12-13 ENCOUNTER — Ambulatory Visit: Payer: Medicaid Other | Admitting: Family Medicine

## 2014-01-17 ENCOUNTER — Other Ambulatory Visit: Payer: Self-pay | Admitting: Family Medicine

## 2014-01-17 ENCOUNTER — Encounter: Payer: Self-pay | Admitting: Family Medicine

## 2014-01-17 ENCOUNTER — Ambulatory Visit (INDEPENDENT_AMBULATORY_CARE_PROVIDER_SITE_OTHER): Payer: Medicaid Other | Admitting: Family Medicine

## 2014-01-17 VITALS — BP 143/86 | HR 107 | Temp 98.1°F | Ht 64.0 in | Wt 161.8 lb

## 2014-01-17 DIAGNOSIS — M25559 Pain in unspecified hip: Secondary | ICD-10-CM

## 2014-01-17 DIAGNOSIS — I10 Essential (primary) hypertension: Secondary | ICD-10-CM

## 2014-01-17 DIAGNOSIS — M6740 Ganglion, unspecified site: Secondary | ICD-10-CM

## 2014-01-17 DIAGNOSIS — M25551 Pain in right hip: Secondary | ICD-10-CM

## 2014-01-17 MED ORDER — HYDROCODONE-ACETAMINOPHEN 5-325 MG PO TABS: 1.0000 | ORAL_TABLET | Freq: Three times a day (TID) | ORAL | Status: DC | PRN

## 2014-01-17 NOTE — Patient Instructions (Addendum)
It was nice to see you again  I hope that your father gets well soon Please follow up with orthopedics as soon as you can get in Please make an appointment with me to get your pap smear done, as you are 2 years late on this screening   Hip Pain Your hip is the joint between your upper legs and your lower pelvis. The bones, cartilage, tendons, and muscles of your hip joint perform a lot of work each day supporting your body weight and allowing you to move around. Hip pain can range from a minor ache to severe pain in one or both of your hips. Pain may be felt on the inside of the hip joint near the groin, or the outside near the buttocks and upper thigh. You may have swelling or stiffness as well.  HOME CARE INSTRUCTIONS   Take medicines only as directed by your health care provider.  Apply ice to the injured area:  Put ice in a plastic bag.  Place a towel between your skin and the bag.  Leave the ice on for 15-20 minutes at a time, 3-4 times a day.  Keep your leg raised (elevated) when possible to lessen swelling.  Avoid activities that cause pain.  Follow specific exercises as directed by your health care provider.  Sleep with a pillow between your legs on your most comfortable side.  Record how often you have hip pain, the location of the pain, and what it feels like. SEEK MEDICAL CARE IF:   You are unable to put weight on your leg.  Your hip is red or swollen or very tender to touch.  Your pain or swelling continues or worsens after 1 week.  You have increasing difficulty walking.  You have a fever. SEEK IMMEDIATE MEDICAL CARE IF:   You have fallen.  You have a sudden increase in pain and swelling in your hip. MAKE SURE YOU:   Understand these instructions.  Will watch your condition.  Will get help right away if you are not doing well or get worse. Document Released: 10/03/2009 Document Revised: 08/30/2013 Document Reviewed: 12/10/2012 [facility] Patient  Information 2015 [place], [place]. This information is not intended to replace advice given to you by your health care provider. Make sure you discuss any questions you have with your health care provider.

## 2014-01-17 NOTE — Progress Notes (Signed)
Patient ID: Stacey Wong, female   DOB: [birth year], 33 y.o.   MRN: [id number] [facility]  Archie Patten, MD  Subjective:  Patient presents wanting a refill on meds  Anxiety/Depression: Stressful issues at home. Her father has been in the hospital x 2 months. She did now know until he'd been there x 3 weeks. His home aid was taking advantage of him and stealing his money. She feels she's been coping well and that the Prozac, Lamictal, and Zyprexa have been very helpful. In the past, she states she would've resulted to EtOH, but not now. No SI/HI  Right congential hip dysplasia:  Patient currently taking Vicodin and flexeril (she gets lower back spasms) for the pain. Notes it is a 7/10 without medication and a 5/10 with medication. Requires refills today. Has not taken it in 3 days. States pain is worse but denies any withdraw symptoms: no GI upset, increased anxiety, etc. Now states the pain is so bad that it bothers her right knee, her lower back, and her left hip.  Has considered hip replacement but would like for the nodule on her L hand to be addressed before this.   Nodule:  I referred pt on 7/15 to [place]. Patient states no one ever called her. Feels the nodule is stable but is very concerned about it, even though I've re-assured her that it was not malignant. After reviewing EMR, both our clinic and the ortho clinic left messages with the patient about an appointment that she did not go to. Additionally, a post-card was sent that she did not recall receiving.   ROS- No GI upset, fever, chills, SOB, chest pain, headache, blurred vision.   Past Medical History Patient Active Problem List   Diagnosis Date Noted  . Healthcare maintenance 11/10/2013  . Ganglion cyst 06/16/2013  . Numbness and tingling in hands 01/16/2013  . 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 HTN (hypertension) 06/20/2011  . Right hip pain 05/27/2011  . Back pain 05/09/2011  . Panic disorder  09/13/2010  . History of alcohol abuse 05/16/2010  . 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 GERD 05/03/2010  . SHOULDER PAIN, RIGHT, CHRONIC 04/10/2010  . Tobacco abuse counseling 01/14/2009  . BIPOLAR AFFECTIVE DISORDER 05/11/2007  . AMENORRHEA 05/11/2007    Medications- reviewed and updated Current Outpatient Prescriptions  Medication Sig Dispense Refill  . albuterol (PROVENTIL HFA;VENTOLIN HFA) 108 (90 BASE) MCG/ACT inhaler Inhale 2 puffs into the lungs every 6 (six) hours as needed. For shortness of breath.  1 Inhaler  3  . beclomethasone (QVAR) 80 MCG/ACT inhaler Inhale 1 puff into the lungs 2 (two) times daily.  1 Inhaler  12  . cyclobenzaprine (FLEXERIL) 10 MG tablet TAKE 1 TABLET BY MOUTH THREE TIMES DAILY AS NEEDED FOR SPASMS  60 tablet  0  . FLUoxetine (PROZAC) 40 MG capsule Take 1 capsule (40 mg total) by mouth daily.  90 capsule  3  . hydrochlorothiazide (HYDRODIURIL) 25 MG tablet Take 1 tablet (25 mg total) by mouth daily.  30 tablet  3  . HYDROcodone-acetaminophen (NORCO) 5-325 MG per tablet Take 1 tablet by mouth 3 (three) times daily as needed for severe pain.  90 tablet  0  . lamoTRIgine (LAMICTAL) 100 MG tablet Take 2 tablets (200 mg total) by mouth daily.  60 tablet  2  . meloxicam (MOBIC) 15 MG tablet Take 1 tablet (15 mg total) by mouth daily.  30 tablet  0  . OLANZapine (ZYPREXA) 10  MG tablet TAKE 1 TABLET BY MOUTH AT BEDTIME  30 tablet  0   No current facility-administered medications for this visit.   Continue to smoke 1ppd  Objective: BP 143/86  Pulse 107  Temp(Src) 98.1 F (36.7 C) (Oral)  Ht 5\' 4"  (1.626 m)  Wt 161 lb 12.8 oz (73.392 kg)  BMI 27.76 kg/m2  LMP 01/12/2014 Gen: No acute distress. Alert, cooperative with exam  HEENT: Atraumatic, Oropharynx clear. MMM  CV: RRR. No murmurs, rubs, or gallops noted. 2+ radial and DP pulses bilaterally.  Resp: CTAB. No wheezing, crackles, or rhonchi noted.  Abd: +BS. Soft, non-distended, non-tender. No  rebound or guarding.  Ext: No edema. There's a fibrous irregularly shaped nodule that is minimally mobile at the base of the left middle finger. Non-tender to palpation with no erythema or drainage noted. Pain with external rotation of the right hip. Minimal pain with hip flexion and extension on the right. No pain with palpation over the right hip. 5/5 strength in the upper and lower extremities bilaterally.  Neuro: Alert and oriented, No gross focal deficits   Assessment/Plan:  Ganglion cyst Stable. Urged patient to call [facility] [employer] as she missed her last appointment  HTN (hypertension) Stable.  - Continue HCTZ  Right hip pain Patient with a long history of chronic pain medication. She does have a good reason to be in pain, but I explained that she needed to sincerely consider a surgery before things continue to worsen. Patient continues to have reasons to want to wait on a surgery, first for her bipolar disorder to be controlled and now this ganglion cyst.  -Decreased Norco 5/335 to TID instead of QID. -Pain contract signed today - UDS obtained today - Urged pt to discuss options since she'll be there to be evaluated for the ganglion cyst.     Orders Placed This Encounter  Procedures  . Opiate, quantitative, urine  . Drug Scr Ur, Pain Mgmt, Reflex Conf    Meds ordered this encounter  Medications  . HYDROcodone-acetaminophen (NORCO) 5-325 MG per tablet    Sig: Take 1 tablet by mouth 3 (three) times daily as needed for severe pain.    Dispense:  90 tablet    Refill:  0

## 2014-01-17 NOTE — Assessment & Plan Note (Signed)
Stable. Continue HCTZ. 

## 2014-01-17 NOTE — Assessment & Plan Note (Signed)
Stable. Urged patient to call [employer] as she missed her last appointment

## 2014-01-17 NOTE — Assessment & Plan Note (Signed)
Patient with a long history of chronic pain medication. She does have a good reason to be in pain, but I explained that she needed to sincerely consider a surgery before things continue to worsen. Patient continues to have reasons to want to wait on a surgery, first for her bipolar disorder to be controlled and now this ganglion cyst.  -Decreased Norco 5/335 to TID instead of QID. -Pain contract signed today - UDS obtained today - Urged pt to discuss options since she'll be there to be evaluated for the ganglion cyst.

## 2014-01-18 LAB — DRUG SCR UR, PAIN MGMT, REFLEX CONF
Amphetamine Screen, Ur: NEGATIVE
BARBITURATE QUANT UR: NEGATIVE
BENZODIAZEPINES.: NEGATIVE
Cocaine Metabolites: NEGATIVE
Creatinine,U: 88.56 mg/dL
Methadone: NEGATIVE
Opiates: NEGATIVE
PROPOXYPHENE: NEGATIVE
Phencyclidine (PCP): NEGATIVE

## 2014-01-20 LAB — OPIATES/OPIOIDS (LC/MS-MS)
CODEINE URINE: NEGATIVE ng/mL (ref <50)
HYDROCODONE: NEGATIVE ng/mL (ref <50)
Hydromorphone: NEGATIVE ng/mL (ref <50)
Morphine Urine: NEGATIVE ng/mL (ref <50)
NORHYDROCODONE, UR: NEGATIVE ng/mL (ref <50)
NOROXYCODONE, UR: 1653 ng/mL — AB (ref <50)
OXYMORPHONE, URINE: 86 ng/mL — AB (ref <50)
Oxycodone, ur: 454 ng/mL — ABNORMAL HIGH (ref <50)

## 2014-01-20 LAB — CANNABANOIDS (GC/LC/MS), URINE: THC-COOH (GC/LC/MS), ur confirm: 284 ng/mL — ABNORMAL HIGH (ref <5)

## 2014-01-26 ENCOUNTER — Telehealth: Payer: Self-pay | Admitting: Family Medicine

## 2014-01-26 MED ORDER — CYCLOBENZAPRINE HCL 10 MG PO TABS: ORAL_TABLET | ORAL | Status: DC

## 2014-01-26 NOTE — Telephone Encounter (Signed)
Patient requested a refill on Flexeril while present for her daughter's appointment. Rx sent in to pharmacy.

## 2014-01-28 ENCOUNTER — Other Ambulatory Visit: Payer: Self-pay | Admitting: *Deleted

## 2014-01-29 MED ORDER — LAMOTRIGINE 100 MG PO TABS: 200.0000 mg | ORAL_TABLET | Freq: Every day | ORAL | Status: DC

## 2014-01-29 NOTE — Telephone Encounter (Signed)
Flexeril was refilled on 9/30, therefore another refill was not done today. Lamictal Rx refilled and at her pharmacy.

## 2014-01-31 ENCOUNTER — Other Ambulatory Visit: Payer: Self-pay | Admitting: *Deleted

## 2014-01-31 NOTE — Telephone Encounter (Signed)
Please let the patient know that I sent a refill for the Flexeril on 9/30 to her pharmacy.  Thanks, Archie Patten, MD [facility] Resident

## 2014-02-07 ENCOUNTER — Other Ambulatory Visit: Payer: Self-pay | Admitting: *Deleted

## 2014-02-07 NOTE — Telephone Encounter (Signed)
Please let the patient know that I called the pharmacy and they said she picked up her Flexeril on 10/2. It is not time for her refill yet.  Thanks, Archie Patten, MD [facility] Resident  02/07/2014, 5:00 PM

## 2014-02-14 ENCOUNTER — Other Ambulatory Visit: Payer: Self-pay | Admitting: Family Medicine

## 2014-02-14 NOTE — Telephone Encounter (Signed)
Please let the know the prescription for Flexeril has not been refilled as it has not been a month since it was refilled last.    Thanks, Archie Patten, MD [facility] Resident  02/14/2014, 10:38 AM

## 2014-02-16 ENCOUNTER — Ambulatory Visit (INDEPENDENT_AMBULATORY_CARE_PROVIDER_SITE_OTHER): Payer: Medicaid Other | Admitting: Family Medicine

## 2014-02-16 ENCOUNTER — Encounter: Payer: Self-pay | Admitting: Family Medicine

## 2014-02-16 VITALS — BP 154/91 | HR 83 | Temp 97.9°F | Ht 64.0 in | Wt 166.1 lb

## 2014-02-16 DIAGNOSIS — Z716 Tobacco abuse counseling: Secondary | ICD-10-CM

## 2014-02-16 DIAGNOSIS — J4520 Mild intermittent asthma, uncomplicated: Secondary | ICD-10-CM

## 2014-02-16 DIAGNOSIS — M25551 Pain in right hip: Secondary | ICD-10-CM

## 2014-02-16 MED ORDER — CYCLOBENZAPRINE HCL 10 MG PO TABS: ORAL_TABLET | ORAL | Status: DC

## 2014-02-16 MED ORDER — HYDROCODONE-ACETAMINOPHEN 5-325 MG PO TABS: 1.0000 | ORAL_TABLET | Freq: Three times a day (TID) | ORAL | Status: DC | PRN

## 2014-02-16 MED ORDER — ALBUTEROL SULFATE HFA 108 (90 BASE) MCG/ACT IN AERS: 2.0000 | INHALATION_SPRAY | Freq: Four times a day (QID) | RESPIRATORY_TRACT | Status: DC | PRN

## 2014-02-16 NOTE — Assessment & Plan Note (Signed)
A: Chronic pain related to congenital hip dysplasia, relieved slightly with Flexeril and Norco, requesting refill today. No red-flag symptoms such as fever, weight loss, numbness / tingling, or change in bowel habits. Has not followed up with ortho as instructed.  P: Rx refilled for Norco and Flexeril. Instructed to contact ortho to reschedule missed appt; pt to call back if referral needs to be re-sent. F/u with Dr. Lorenso Courier in about 1 month, or sooner if needed.

## 2014-02-16 NOTE — Patient Instructions (Signed)
Thank you for coming in, today!  Call [facility] at [phone] They can reschedule your appointment for ganglion cyst. They can also talk to you about your right hip.  Come back to see Dr. Lorenso Courier as needed. Try to see her back in about 1 month. If you need a new referral to orthopedics, call us back after you talk to them. Please feel free to call with any questions or concerns at any time, at [phone]. --Dr. Venetia Maxon

## 2014-02-16 NOTE — Assessment & Plan Note (Signed)
A: Minimal current symptoms, but out of medications (QVAR, albuterol). Worse symptoms with change in weather, and pt does currently smoke. Exam reassuring today so doubt frank exacerbation.  P: Refilled albuterol and instructed pt that she should have refills of QVAR already available; pt to contact pharmacy if she does need refill of QVAR. Doubt need for systemic steroids at this point. Strongly advised smoking cessation (see separate problem list note). F/u with PCP as needed.

## 2014-02-16 NOTE — Assessment & Plan Note (Signed)
Reports ~1 pack per day smoking history, which is definitely contributing to asthma symptoms and likely to chronic pain / inflammation, as well. Discussed 1-800-QUIT-NOW as a resource, as well as pharmacy clinic. Encouraged to cut back by herself as able, and to f/u with her PCP as needed. Per brief chart review, this has been an ongoing issue, made more difficult by bipolar disorder, psychosocial stressors, etc. Will need continued counseling.

## 2014-02-16 NOTE — Progress Notes (Signed)
   Subjective:    Patient ID: Stacey Wong, female    DOB: Oct 19, 1980, 33 y.o.   MRN: [id number]  HPI: Pt presents to clinic for f/u of right hip pain and asthma. She requests medication refills.  Right hip pain - pt describes significant right hip pain related to congenital dysplasia and leg-length discrepancy - pt does have some relief (pain 7/10 --> 5/10) with Flexeril and Norco, which helps her function - endorses a sore, aching constant pain in right hip, with some spread into her lower back and into her left hip due to "overusing" that hip due to pain in the right side - pt has not followed up with ortho as referred a few months ago; she states she needs to call them to f/u about a left hand ganglion cyst and her hip  Asthma - pt describes cough recently and is out of asthma medications - pt has some occasional subjective wheezing, worse with changes in the weather, especially recently - denies frank SOB, chest tightness, or productive cough, currently - does endorse some mild coryza-type symptoms without fever, currently  Note pt is a current smoker, just under a pack per day. She is interested in quitting but describes significant anxiety about stopping smoking and significant stressors at home, which smoking helps.  Review of Systems: As above.     Objective:   Physical Exam BP 154/91  Pulse 83  Temp(Src) 97.9 F (36.6 C) (Oral)  Ht 5\' 4"  (1.626 m)  Wt 166 lb 1.6 oz (75.342 kg)  BMI 28.50 kg/m2  LMP 01/09/2014 Gen: adult female in NAD HEENT: [place]/AT, EOMI, MMM Cardio: RRR, no murmur appreciated Pulm: CTAB, no wheezes noted Abd: soft, nontender, nondistended, BS+ Ext: well-perfused, no LE edema  Ganglion cyst-like lesion noted to left middle finger as described by Dr. Lorenso Courier in previous note, unchanged MSK: tenderness to palpation over bony prominences of lumbar spine and bilateral hips, right much worse than left  Right hip with painful passive ROM, especially with full  flexion at hip and internal rotation but also with external rotation  Left hip without significant pain, when tested with similar passive ROM  Gross / visibly obvious leg-length discrepancy (right shorter than left), known / at baseline per pt Neuro: Alert and oriented, strength 5/5 throughout, with sensation grossly intact     Assessment & Plan:  See problem list notes

## 2014-03-18 ENCOUNTER — Telehealth: Payer: Self-pay | Admitting: Family Medicine

## 2014-03-18 NOTE — Telephone Encounter (Signed)
Pt informed that clinic was about to close in 15 mins. She atleast has to give [place] 72 hours to fill rx. i told her she would have to come in for an appt to get a refill. Her appt is next week with you. Coralynn Gaona CMA

## 2014-03-18 NOTE — Telephone Encounter (Signed)
Refill request for hydrocodone. Call for pick up.

## 2014-03-21 NOTE — Telephone Encounter (Signed)
Pt is calling back, says she is in a lot of pain and does not have any medicine, cannot wait until Wednesday, wants to know if MD will prescribe.

## 2014-03-21 NOTE — Telephone Encounter (Signed)
Returned patient's call. Stated I would not write her a Rx for narcotics without her coming into see me. She asked me why she couldn't get in to see me earlier than Wednesday and I reminded her it was her responsibility to make her monthly appointments and not wait until her Rx was out (as is stated in the pain contract). I noted that if she is in a lot of pain, she could make a SDA in the clinic, however she became frustrated and said she "wasn't going to waste her time."  Archie Patten, MD [facility] Family [facility] Resident  03/21/2014, 4:50 PM

## 2014-03-23 ENCOUNTER — Encounter: Payer: Self-pay | Admitting: Family Medicine

## 2014-03-23 ENCOUNTER — Ambulatory Visit (INDEPENDENT_AMBULATORY_CARE_PROVIDER_SITE_OTHER): Payer: Medicaid Other | Admitting: Family Medicine

## 2014-03-23 VITALS — BP 130/87 | HR 91 | Temp 98.0°F | Ht 64.0 in | Wt 165.9 lb

## 2014-03-23 DIAGNOSIS — F310 Bipolar disorder, current episode hypomanic: Secondary | ICD-10-CM

## 2014-03-23 DIAGNOSIS — M25551 Pain in right hip: Secondary | ICD-10-CM

## 2014-03-23 MED ORDER — CYCLOBENZAPRINE HCL 10 MG PO TABS
ORAL_TABLET | ORAL | Status: DC
Start: 2014-03-23 — End: 2014-04-15

## 2014-03-23 MED ORDER — HYDROCODONE-ACETAMINOPHEN 5-325 MG PO TABS: 1.0000 | ORAL_TABLET | Freq: Three times a day (TID) | ORAL | Status: DC | PRN

## 2014-03-23 NOTE — Progress Notes (Addendum)
[facility]  Archie Patten, MD  Subjective:  Chief complaint: Medication refill   Right congenital hip dysplasia: Patient currently on Vicodin and Flexeril (for lower back spasms). She states she has not been sleeping; unsure if this is due to pain (as I decreased the quantity of Vicodin) or due to increased stress/anxiety. She tells me she feels her pain is approximately a 6-7/10 without medication. The pain has progressed over the years from simply being in the R hip to being in the R hip, R knee, left hip, and lower back bilaterally due to compensating. She has still not seen ortho but tells me she has never received an appt (from looking it appears they've sent her a card with an appt time once and and left a voicemail on another occasion.   Bipolar disorder: Currently on Prozac and Lamictal. She took Zyprexa at one point but didn't like it because it made her shake. Today she states "is there any way to raise my bipolar medicine so I don't kill anyone." From listening to her symptoms and reading previous notes by Dr. Gwenlyn Saran, I am not completely convinced she has bipolar disorder. She tells me she has issues sleeping but she normally just watches TV during this time. Has a h/o drug and alcohol abuse in the past but nothing that sounds like a manic episode. States she gets "very short with people." New stress is that her father was in the hospital recently and then in a SNF and she learned that her daughter is bisexual. She is very tearful when talking about her daughter and tells me "its not natural" and "I never saw this coming" over and over again.  PHQ-9: 14, very difficult MDQ: Yes to 12/13 in #1, yes in #2, Minor problem  ROS- No saddle paresthesias, urinary/bowel incontinence, radicular pain, suicidal or homicidal ideation. No AVH  Smoking history reviewed   Past Medical History Patient Active Problem List   Diagnosis Date Noted  . Healthcare maintenance 11/10/2013   . Ganglion cyst 06/16/2013  . Numbness and tingling in hands 01/16/2013  . 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 HTN (hypertension) 06/20/2011  . Right hip pain 05/27/2011  . Back pain 05/09/2011  . Panic disorder 09/13/2010  . History of alcohol abuse 05/16/2010  . Asthma 05/03/2010  . GERD 05/03/2010  . SHOULDER PAIN, RIGHT, CHRONIC 04/10/2010  . Tobacco abuse counseling 01/14/2009  . Bipolar disorder 05/11/2007  . AMENORRHEA 05/11/2007    Medications- reviewed and updated Current Outpatient Prescriptions  Medication Sig Dispense Refill  . albuterol (PROVENTIL HFA;VENTOLIN HFA) 108 (90 BASE) MCG/ACT inhaler Inhale 2 puffs into the lungs every 6 (six) hours as needed. For shortness of breath. 1 Inhaler 3  . beclomethasone (QVAR) 80 MCG/ACT inhaler Inhale 1 puff into the lungs 2 (two) times daily. 1 Inhaler 12  . cyclobenzaprine (FLEXERIL) 10 MG tablet TAKE 1 TABLET BY MOUTH THREE TIMES DAILY AS NEEDED FOR SPASMS 60 tablet 0  . FLUoxetine (PROZAC) 40 MG capsule Take 1 capsule (40 mg total) by mouth daily. 90 capsule 3  . hydrochlorothiazide (HYDRODIURIL) 25 MG tablet Take 1 tablet (25 mg total) by mouth daily. 30 tablet 3  . HYDROcodone-acetaminophen (NORCO) 5-325 MG per tablet Take 1 tablet by mouth 3 (three) times daily as needed for severe pain. 90 tablet 0  . lamoTRIgine (LAMICTAL) 100 MG tablet Take 2 tablets (200 mg total) by mouth daily. 60 tablet 2  . meloxicam (MOBIC) 15 MG tablet Take 1  tablet (15 mg total) by mouth daily. 30 tablet 0   No current facility-administered medications for this visit.    Objective: BP 130/87 mmHg  Pulse 91  Temp(Src) 98 F (36.7 C) (Oral)  Ht 5\' 4"  (1.626 m)  Wt 165 lb 14.4 oz (75.252 kg)  BMI 28.46 kg/m2  SpO2 95%  LMP 03/17/2014 (Approximate) Gen: Tearful throughout the visit HEENT: Atraumatic, Oropharynx clear. MMM CV: RRR. No murmurs, rubs, or gallops noted. 2+ radial and DP pulses bilaterally. Resp: CTAB. No wheezing, crackles, or  rhonchi noted. Abd: +BS. Soft, non-distended, non-tender. No rebound or guarding.  Ext: Ganglion cyst on the left 3rd finger, stable from previous exams. Tenderness to palpation over the spinal processes of the lumbar spine, over the bony prominences of the hips R>L. Painful passive ROM on the R hip with flexion and internal rotation, some painful passive ROM on the L but less significant. 4+/5 strength with R hip flexion and extension, 5/5 on the left.    Assessment/Plan:  Bipolar disorder Patient continues to have anxiety and temper issues; unsure this is related to bipolar (if she does in fact have bipolar). Given the uncertainty of her diagnosis, I am hesitant to add any more medications. She discontinued her Zyprexa and denies any AVH. No suicidal or homicidal ideation. Not self-harming. After speaking with the patient, she would most benefit from speaking with someone like Dr. Gwenlyn Saran; patient is agreeable. - Continue Lamictal and Prozac - Pt to schedule an appt with Dr. Gwenlyn Saran - Discussed the importance of avoiding alcohol and drugs  Right hip pain Chronic pain related to congenital right hip dysplasia, relieved slightly with Flexeril and Vicodin, requesting refill today. No red-flags such as fever, weight loss, numbness / tingling, saddle paresthesias, or change in bowel habits. Has not followed up with ortho as instructed. - Provided clinic number for Dr. Caralyn Guile with ortho- advised that she should call to make an appt (in hopes this would be more effective than our staff making her an appt which she claims she never hears from). - Refilled Vicodin and Flexeril       No orders of the defined types were placed in this encounter.    Meds ordered this encounter  Medications  . HYDROcodone-acetaminophen (NORCO) 5-325 MG per tablet    Sig: Take 1 tablet by mouth 3 (three) times daily as needed for severe pain.    Dispense:  90 tablet    Refill:  0  . cyclobenzaprine (FLEXERIL) 10 MG  tablet    Sig: TAKE 1 TABLET BY MOUTH THREE TIMES DAILY AS NEEDED FOR SPASMS    Dispense:  60 tablet    Refill:  0

## 2014-03-23 NOTE — Patient Instructions (Addendum)
Please make an appointment with Dr. Gwenlyn Saran, I feel she will more beneficial than any medication  Please call Dr. Charlean Merl office to make an appointment- if they state you need another referral please call our clinic. Follow up with me in 1 month or sooner if necessary.

## 2014-03-24 NOTE — Assessment & Plan Note (Signed)
Patient continues to have anxiety and temper issues; unsure this is related to bipolar (if she does in fact have bipolar). Given the uncertainty of her diagnosis, I am hesitant to add any more medications. She discontinued her Zyprexa and denies any AVH. No suicidal or homicidal ideation. Not self-harming. After speaking with the patient, she would most benefit from speaking with someone like Dr. Gwenlyn Saran; patient is agreeable. - Continue Lamictal and Prozac - Pt to schedule an appt with Dr. Gwenlyn Saran - Discussed the importance of avoiding alcohol and drugs

## 2014-03-24 NOTE — Assessment & Plan Note (Signed)
Chronic pain related to congenital right hip dysplasia, relieved slightly with Flexeril and Vicodin, requesting refill today. No red-flags such as fever, weight loss, numbness / tingling, saddle paresthesias, or change in bowel habits. Has not followed up with ortho as instructed. - Provided clinic number for Dr. Caralyn Guile with ortho- advised that she should call to make an appt (in hopes this would be more effective than our staff making her an appt which she claims she never hears from). - Refilled Vicodin and Flexeril

## 2014-04-07 ENCOUNTER — Telehealth: Payer: Self-pay | Admitting: Psychology

## 2014-04-07 ENCOUNTER — Ambulatory Visit: Payer: Medicaid Other | Admitting: Psychology

## 2014-04-07 NOTE — Telephone Encounter (Signed)
Noticed that Stacey Wong was scheduled to see me at 3:30 today.  This appointment was made without my knowledge.  I stop working at 3:00.  I called the patient and left a VM to have her call me so we could discuss.

## 2014-04-14 ENCOUNTER — Other Ambulatory Visit: Payer: Self-pay | Admitting: Family Medicine

## 2014-04-14 DIAGNOSIS — M25551 Pain in right hip: Secondary | ICD-10-CM

## 2014-04-14 NOTE — Telephone Encounter (Signed)
Pt called because she was told that we canceled her appointment 04/25/14 because the doctor was not going to be here. The problem is that she will be out of medication before the next scheduled appointment which is going to be 05/13/14. Can we refill enough medication to get her through to her new appointment? Please call patient to discuss. jw

## 2014-04-15 ENCOUNTER — Other Ambulatory Visit: Payer: Self-pay | Admitting: Family Medicine

## 2014-04-15 MED ORDER — HYDROCODONE-ACETAMINOPHEN 5-325 MG PO TABS: 1.0000 | ORAL_TABLET | Freq: Three times a day (TID) | ORAL | Status: DC | PRN

## 2014-04-15 NOTE — Telephone Encounter (Signed)
Patient informed, expressed understanding. 

## 2014-04-15 NOTE — Telephone Encounter (Signed)
Please let the patient know that I wrote a Rx for a half month's supply at the front desk (not to be filled before 04/21/14)- this is a ONE time thing as it is our clinic's fault that her appointment was canceled.  Please stress that she should keep her appointment with me or I will not be able to prescribe her any more.  Thanks, Archie Patten, MD [facility] Family Medicine Resident  04/15/2014, 2:06 PM

## 2014-04-15 NOTE — Telephone Encounter (Signed)
Stacey Wong called yesterday and left a VM requesting a return phone call.  I called her back and left a VM.

## 2014-04-15 NOTE — Telephone Encounter (Signed)
Correction appt 05/03/13.

## 2014-04-25 ENCOUNTER — Ambulatory Visit: Payer: Medicaid Other | Admitting: Family Medicine

## 2014-05-03 ENCOUNTER — Ambulatory Visit: Payer: Medicaid Other | Admitting: Family Medicine

## 2014-05-06 ENCOUNTER — Other Ambulatory Visit: Payer: Self-pay | Admitting: Family Medicine

## 2014-05-06 NOTE — Telephone Encounter (Signed)
Pt called and needs a refill on her Flexeril and Hydrocodone left up front for pick up. jw

## 2014-05-08 NOTE — Telephone Encounter (Signed)
Please let the patient know that I cannot refill her medications (as was stated last time I refilled her Hydrocodone) without her attending her schedule appt.  Thanks, Archie Patten, MD [facility] Family Medicine Resident  05/08/2014, 1:21 PM

## 2014-05-10 NOTE — Telephone Encounter (Signed)
Patient has appointment scheduled for 1/15, can address then.

## 2014-05-13 ENCOUNTER — Ambulatory Visit: Payer: Medicaid Other | Admitting: Family Medicine

## 2014-05-16 ENCOUNTER — Telehealth: Payer: Self-pay | Admitting: Family Medicine

## 2014-05-16 ENCOUNTER — Ambulatory Visit: Payer: Medicaid Other | Admitting: Family Medicine

## 2014-05-16 NOTE — Telephone Encounter (Signed)
Patient missed appt.  Said she was unable to get through to see if we were opened or not.  Want to get refill on muscle relaxer and pain medication

## 2014-05-16 NOTE — Telephone Encounter (Signed)
Line busy, per MD no refills of either of these medications until patient is seen at an office visit.

## 2014-05-19 ENCOUNTER — Other Ambulatory Visit: Payer: Self-pay | Admitting: Family Medicine

## 2014-05-20 IMAGING — CR DG HAND COMPLETE 3+V*L*
4 series · 4 of 4 positions shown · non-contrast
Comparison: None.

CLINICAL DATA: Nodule at base of left third finger

EXAM:
LEFT HAND - COMPLETE 3+ VIEW

[view not recorded (1 of 4)]
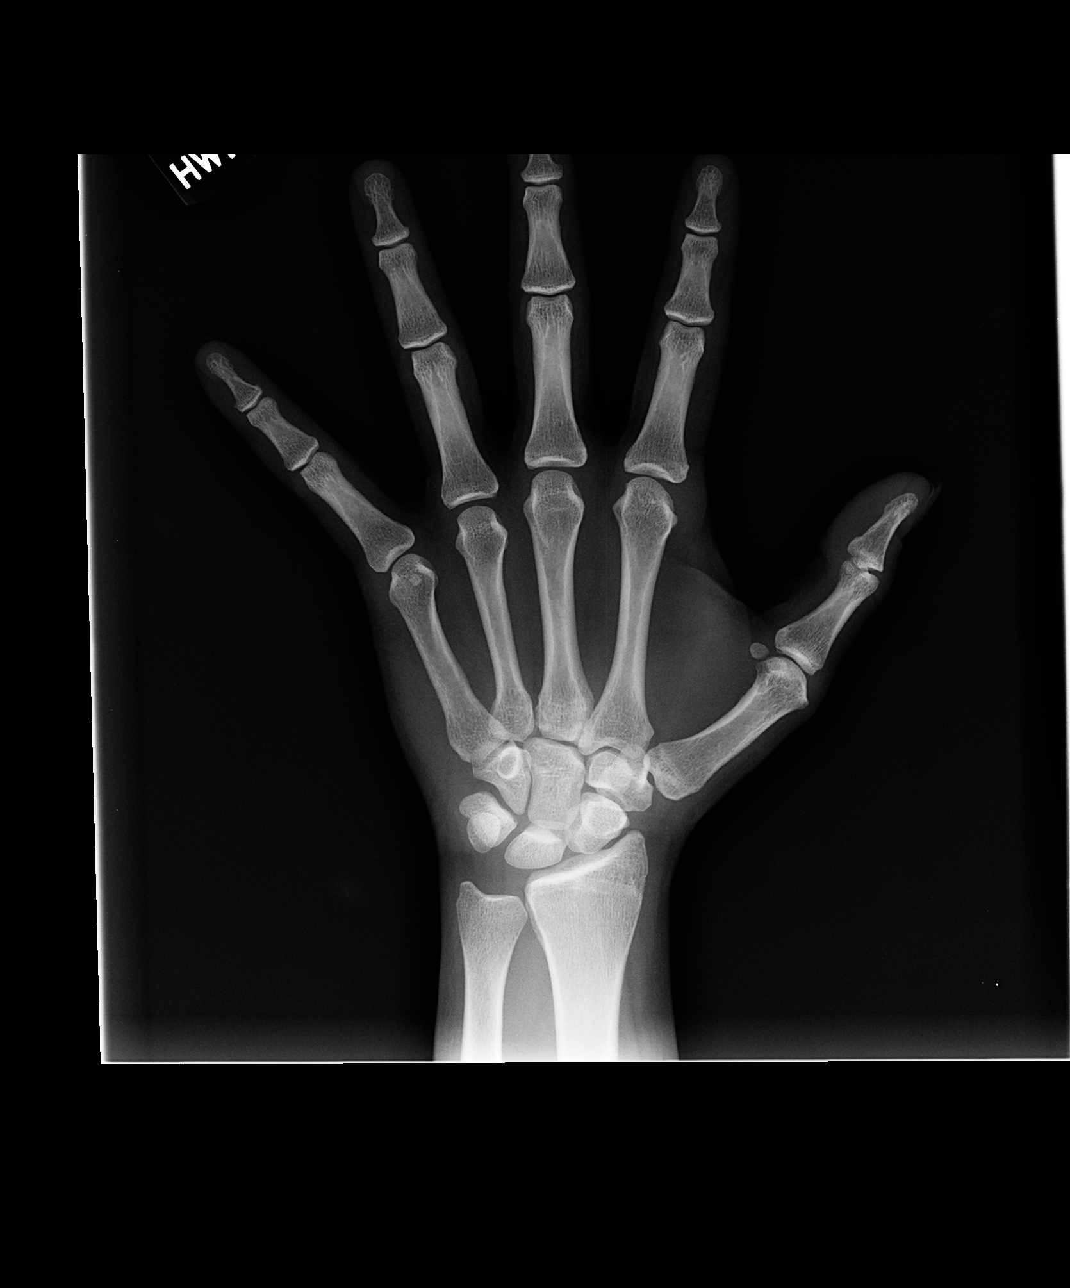

[view not recorded (2 of 4)]
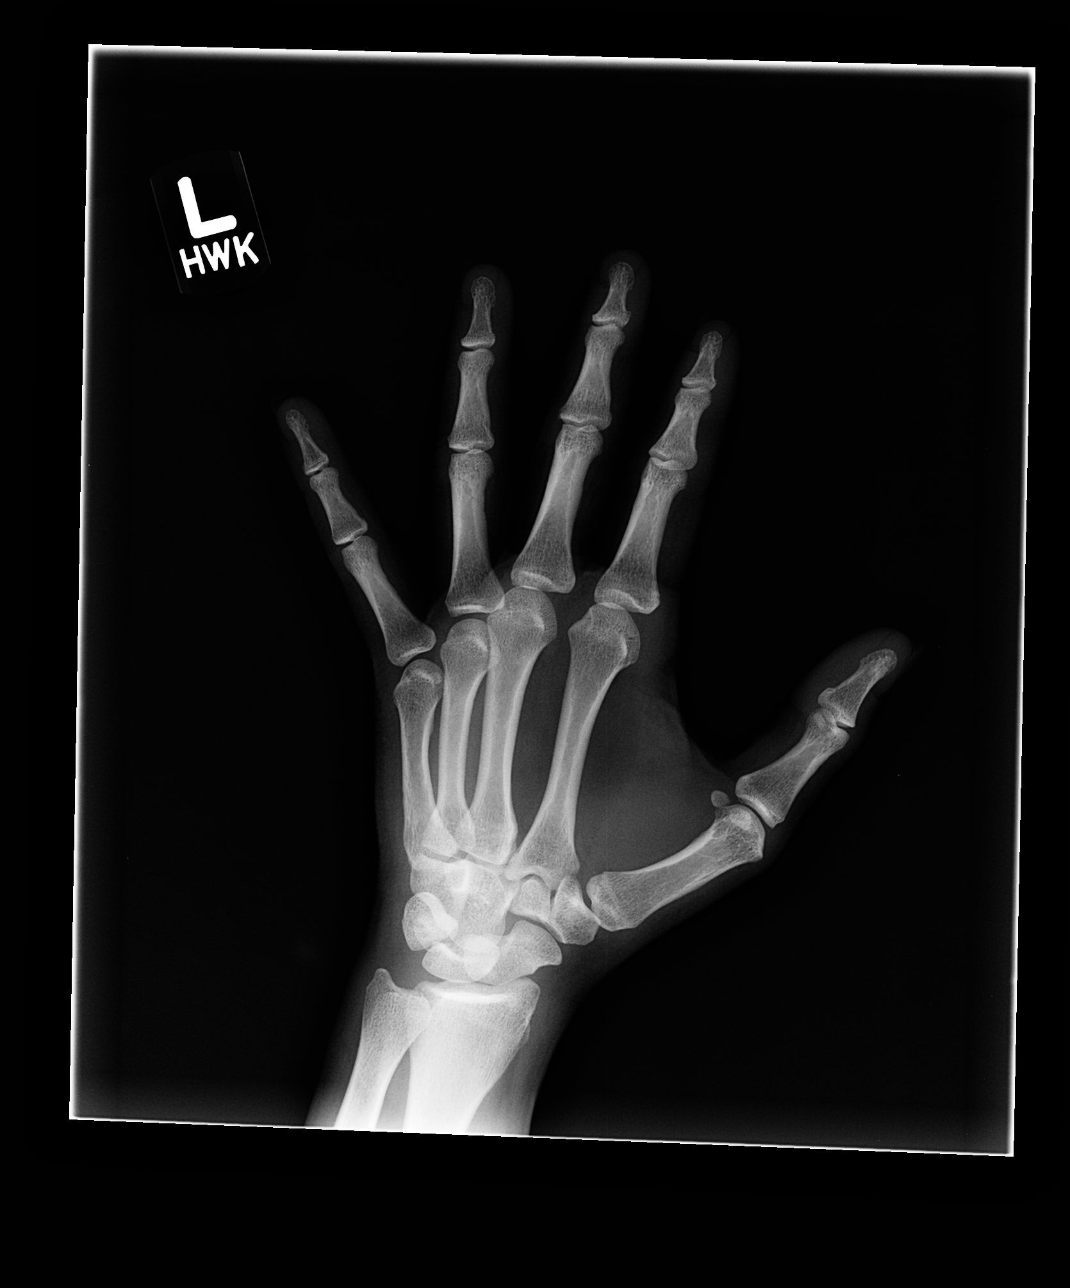

[view not recorded (3 of 4)]
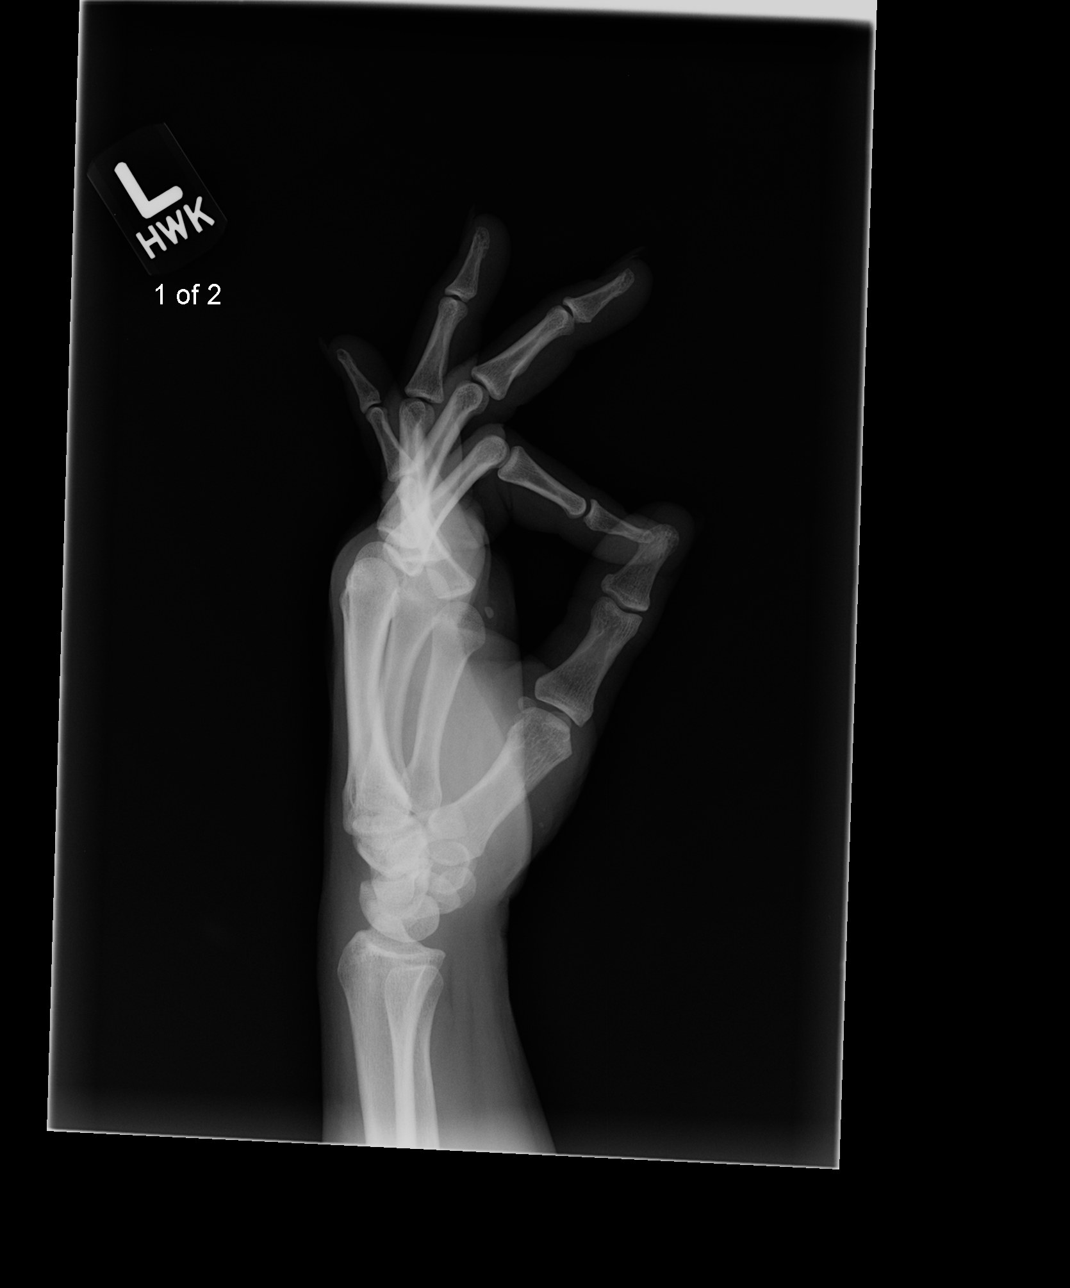

[view not recorded (4 of 4)]
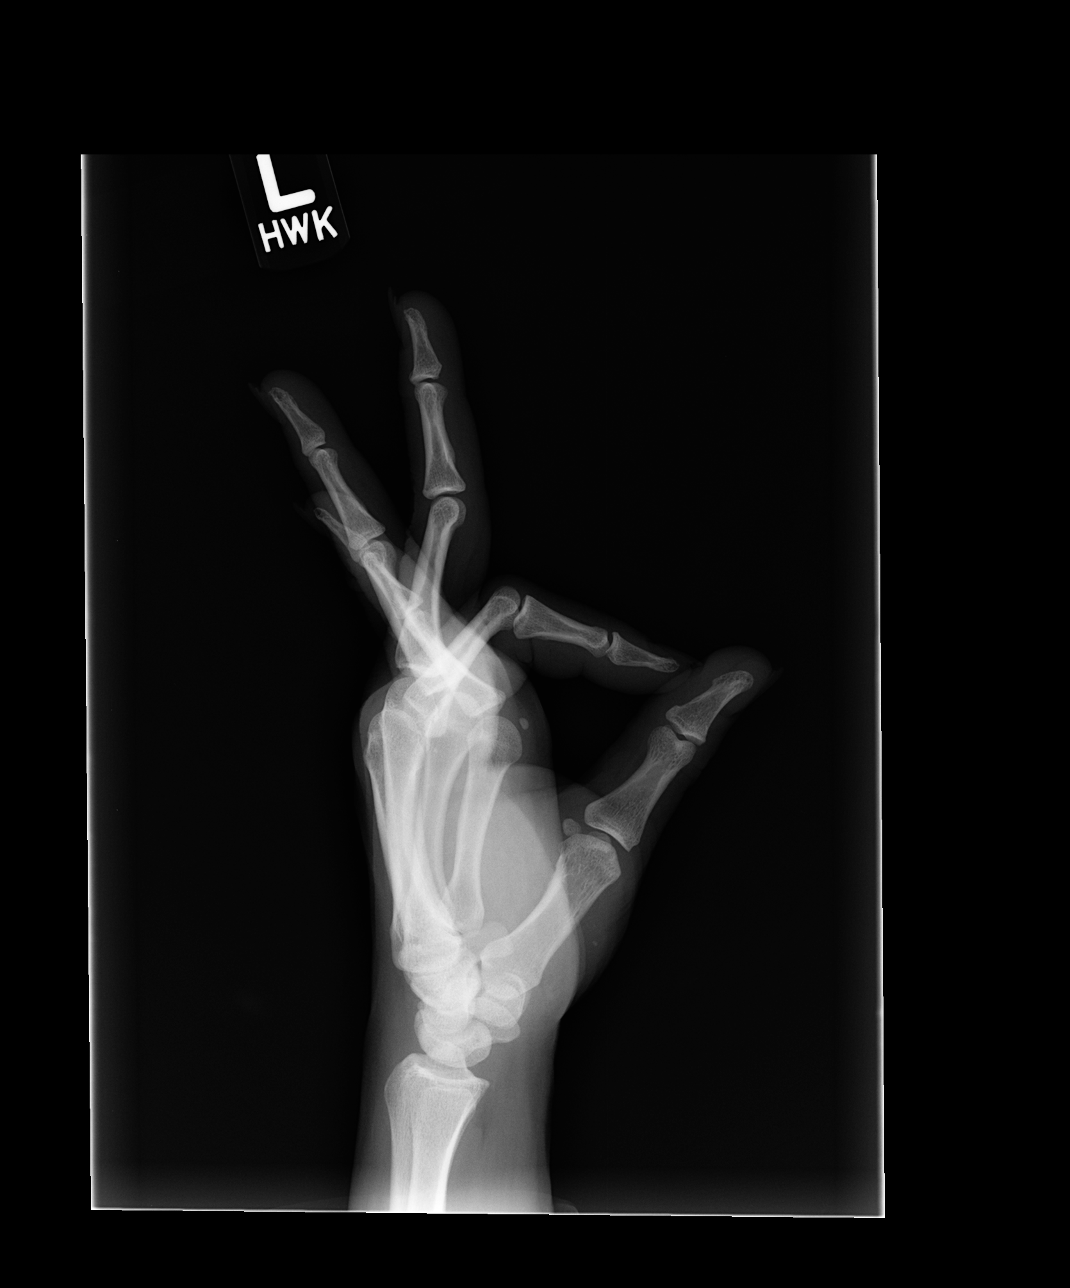

[4 of 4 positions shown; findings below may reference images not displayed]

FINDINGS: No fracture. No bone lesion. Joints are normally space and aligned.
No arthropathic changes.

No soft tissue mass or nodule. No soft tissue calcification or
ossification.
IMPRESSION: Negative.

## 2014-05-21 NOTE — Telephone Encounter (Signed)
Rx for lamictal refilled.  Thanks, Archie Patten, MD [facility] Family Medicine Resident  05/21/2014, 7:17 PM

## 2014-05-23 ENCOUNTER — Other Ambulatory Visit: Payer: Self-pay | Admitting: Family Medicine

## 2014-05-23 ENCOUNTER — Ambulatory Visit: Payer: Medicaid Other | Admitting: Family Medicine

## 2014-05-23 DIAGNOSIS — M25551 Pain in right hip: Secondary | ICD-10-CM

## 2014-05-23 MED ORDER — HYDROCODONE-ACETAMINOPHEN 5-325 MG PO TABS: 1.0000 | ORAL_TABLET | Freq: Three times a day (TID) | ORAL | Status: DC | PRN

## 2014-05-23 NOTE — Progress Notes (Signed)
Ms. 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 She has been out of medication x 1 week now. Patient notes constant aching pain in R hip that is 8/10, with occasional pain of the L hip and lower back. Of note, patient was scheduled to see me on 04/25/14 which required cancellation on our part due to conflicting work schedules. She has no-showed to 3 appts since 05/04/14 (per her due to time confusion and concerns of the clinic being close on Radium day).   Discussed refilling her Rx today as her appt was canceled due to inclement weather. Advised her to make a f/u appt with me in 1 month, as I cannot write her Rxs without evaluating her.  Refilled  Hydrocodone-acetaminophen 5-325mg  TID PRN pain #90.  Archie Patten, MD [facility] Resident  05/23/2014, 10:50 AM

## 2014-05-24 NOTE — Telephone Encounter (Signed)
Needs refill on the muscle relaxer  [facility] on [address]

## 2014-05-24 NOTE — Telephone Encounter (Signed)
Flexeril refilled.  Archie Patten, MD [facility] Medicine Resident  05/24/2014, 12:33 PM

## 2014-06-16 ENCOUNTER — Ambulatory Visit (INDEPENDENT_AMBULATORY_CARE_PROVIDER_SITE_OTHER): Payer: Self-pay | Admitting: Family Medicine

## 2014-06-16 ENCOUNTER — Encounter: Payer: Self-pay | Admitting: Family Medicine

## 2014-06-16 VITALS — BP 117/76 | HR 79 | Temp 98.1°F | Ht 64.0 in | Wt 167.5 lb

## 2014-06-16 DIAGNOSIS — Z7189 Other specified counseling: Secondary | ICD-10-CM

## 2014-06-16 DIAGNOSIS — G8929 Other chronic pain: Secondary | ICD-10-CM | POA: Insufficient documentation

## 2014-06-16 DIAGNOSIS — F310 Bipolar disorder, current episode hypomanic: Secondary | ICD-10-CM

## 2014-06-16 DIAGNOSIS — F3162 Bipolar disorder, current episode mixed, moderate: Secondary | ICD-10-CM

## 2014-06-16 DIAGNOSIS — M25551 Pain in right hip: Secondary | ICD-10-CM

## 2014-06-16 MED ORDER — HYDROCODONE-ACETAMINOPHEN 5-325 MG PO TABS: 1.0000 | ORAL_TABLET | Freq: Three times a day (TID) | ORAL | Status: DC | PRN

## 2014-06-16 MED ORDER — FLUOXETINE HCL 40 MG PO CAPS: 40.0000 mg | ORAL_CAPSULE | Freq: Every day | ORAL | Status: DC

## 2014-06-16 MED ORDER — CYCLOBENZAPRINE HCL 10 MG PO TABS: ORAL_TABLET | ORAL | Status: DC

## 2014-06-16 MED ORDER — LAMOTRIGINE 100 MG PO TABS: 200.0000 mg | ORAL_TABLET | Freq: Every day | ORAL | Status: DC

## 2014-06-16 NOTE — Progress Notes (Addendum)
Patient ID: Stacey Wong, female   DOB: [birth year], 34 y.o.   MRN: [id number]     Subjective: CC: f/u hip pain HPI: Patient is a 34 y.o. female presenting to clinic today for a refill on pain medication. Concerns today include:  1. Right congenital hip dysplasia: Patient currently on Norco and Flexeril (for lower back spasms). The pain is approximately a 10/10 without medications and has been interfering with sleep. She notes the pain is most severe in the R hip > R knee > lumbar strain, and she occasionally has left hp pain. She tells me she's worried she's gotten to the point where she may be injuring her left hip (we've discussed this in the past) and recognizes she can't continue to put off meeting with orthopedics.   2. Depression/bipolar: Unsure about the diagnosis of bipolar disorder that the patient carries (she cannot recall who gave her this diagnosis). Her relationship with her daughter has began to improve over the last few months; she's come to accept the fact that her daughter is a bisexual and tells me she loves her daughter and wants what's best for her, even if she "doesn't think its right." She denies any AVH. No suicidal ideation. States she can't sleep but attributes this to pain. Does endorse a loss in interest in activities she used to enjoy (playing pool and cooking), which she thinks "could be to the weather" Also, she stopped drinking alcohol. Occasionally stays up late lying in bed or watching TV, never doing goal-oriented tasks, etc. Notes her behavior used to be more sporadic when she was abusing alcohol.   PHQ-9: 10, somewhat difficult   Social History: smoking status reviewed.  Health Maintenance: Patient due for pap smear (states she'll make an appt), she declines flu vaccine today.   ROS: All other systems reviewed and are negative.  Past Medical History Patient Active Problem List   Diagnosis Date Noted  . Encounter for chronic pain management 06/16/2014  .  Healthcare maintenance 11/10/2013  . Ganglion cyst 06/16/2013  . Numbness and tingling in hands 01/16/2013  . 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 HTN (hypertension) 06/20/2011  . Right hip pain 05/27/2011  . Back pain 05/09/2011  . Panic disorder 09/13/2010  . History of alcohol abuse 05/16/2010  . Asthma 05/03/2010  . GERD 05/03/2010  . SHOULDER PAIN, RIGHT, CHRONIC 04/10/2010  . Tobacco abuse counseling 01/14/2009  . Bipolar disorder 05/11/2007  . AMENORRHEA 05/11/2007    Medications- reviewed and updated Current Outpatient Prescriptions  Medication Sig Dispense Refill  . albuterol (PROVENTIL HFA;VENTOLIN HFA) 108 (90 BASE) MCG/ACT inhaler Inhale 2 puffs into the lungs every 6 (six) hours as needed. For shortness of breath. 1 Inhaler 3  . beclomethasone (QVAR) 80 MCG/ACT inhaler Inhale 1 puff into the lungs 2 (two) times daily. 1 Inhaler 12  . cyclobenzaprine (FLEXERIL) 10 MG tablet TAKE 1 TABLET BY MOUTH THREE TIMES DAILY AS NEEDED FOR SPASMS 60 tablet 0  . FLUoxetine (PROZAC) 40 MG capsule Take 1 capsule (40 mg total) by mouth daily. 90 capsule 3  . hydrochlorothiazide (HYDRODIURIL) 25 MG tablet Take 1 tablet (25 mg total) by mouth daily. 30 tablet 3  . HYDROcodone-acetaminophen (NORCO) 5-325 MG per tablet Take 1 tablet by mouth 3 (three) times daily as needed for severe pain. 90 tablet 0  . lamoTRIgine (LAMICTAL) 100 MG tablet Take 2 tablets (200 mg total) by mouth daily. 60 tablet 2  . meloxicam (MOBIC) 15 MG tablet Take 1 tablet (15  mg total) by mouth daily. 30 tablet 0   No current facility-administered medications for this visit.    Objective: Office vital signs reviewed. BP 117/76 mmHg  Pulse 79  Temp(Src) 98.1 F (36.7 C) (Oral)  Ht 5\' 4"  (1.626 m)  Wt 75.978 kg (167 lb 8 oz)  BMI 28.74 kg/m2  LMP 05/16/2014   Physical Examination:  General: Awake, alert, well- nourished, NAD. Wearing make-up with her hair done (which is a change from usual) Cardio: RRR, no m/r/g  noted. 2+ pulses bilaterally. Pulm: CTAB, no wheezes, rhonchi or crackles noted.  GI: soft, NT/ND,+BS x4 Neuro: No tenderness over the spinal processes, tenderness over the right paraspinal muscles with tension. No tenderness over the left hip, moderate tenderness to palpation over the right hip. Full ROM at the hips. 4+/5 hip flexion, extension and internal rotation and keen flexion/extension on the right, 5/5 on the left.   Assessment/Plan: Encounter for chronic pain management Continues to have pain due to R hip dysplasia that has progressively worsened due to deferring surgical intervention. Patient has been putting off meeting with orthopedics for some time now due to wanting her mental state to be better. I feel she may be closer to contacting ortho, as she recognizes the damage to her lower back and left hip from compensating. The patient asks for shoe inserts, however we do not have them in clinic and I advised the patient should would benefit from meeting with ortho so they could determine what size to obtain. Patient continues to have some weakness on the R side, presumed to be due to pain. No red flags on exam: no saddle paresthesias, urinary/bladder incontinence, or radicular pain. Negative straight leg test.  - Norco and Flexeril refilled - Discussed and provided written information about ortho f/u   Bipolar disorder Patient continues to have anxiety and depression, more so now with different changes in her life. Her father has been taken advantage of and hospitalized within the last year, she's stopped drinking, and she's had issues coping with her daughter's sexuality. She attempted to make a f/u appt with Dr. Gwenlyn Saran, however it was canceled. She believes she would benefit from meeting with Dr. Gwenlyn Saran to talk about things and make changes, if necessary to her medications. - Advised to make another appt with Dr. Gwenlyn Saran - Refilled Lamictal and Fluoxetine  - Given h/o of alcohol abuse, would be  cautious and attempt to avoid benzodiazepines.     No orders of the defined types were placed in this encounter.    Meds ordered this encounter  Medications  . HYDROcodone-acetaminophen (NORCO) 5-325 MG per tablet    Sig: Take 1 tablet by mouth 3 (three) times daily as needed for severe pain.    Dispense:  90 tablet    Refill:  0  . cyclobenzaprine (FLEXERIL) 10 MG tablet    Sig: TAKE 1 TABLET BY MOUTH THREE TIMES DAILY AS NEEDED FOR SPASMS    Dispense:  60 tablet    Refill:  0  . FLUoxetine (PROZAC) 40 MG capsule    Sig: Take 1 capsule (40 mg total) by mouth daily.    Dispense:  90 capsule    Refill:  3  . lamoTRIgine (LAMICTAL) 100 MG tablet    Sig: Take 2 tablets (200 mg total) by mouth daily.    Dispense:  60 tablet    Refill:  [place] PGY-1, [place]

## 2014-06-16 NOTE — Patient Instructions (Signed)
It was good to see you again. I am glad that things are starting to improve between you and your daughter. Please make an appointment with Dr. Gwenlyn Saran- she's great to talk to and would helpful in determining whether a change to your medications is warranted. Call orthopedics so that they can assess your right hip, I don't want you to have 2 bad hips in the future! Please schedule an appointment with me to have a pap smear

## 2014-06-17 NOTE — Assessment & Plan Note (Signed)
Patient continues to have anxiety and depression, more so now with different changes in her life. Her father has been taken advantage of and hospitalized within the last year, she's stopped drinking, and she's had issues coping with her daughter's sexuality. She attempted to make a f/u appt with Dr. Gwenlyn Saran, however it was canceled. She believes she would benefit from meeting with Dr. Gwenlyn Saran to talk about things and make changes, if necessary to her medications. - Advised to make another appt with Dr. Gwenlyn Saran - Refilled Lamictal and Fluoxetine  - Given h/o of alcohol abuse, would be cautious and attempt to avoid benzodiazepines.

## 2014-06-17 NOTE — Assessment & Plan Note (Signed)
Continues to have pain due to R hip dysplasia that has progressively worsened due to deferring surgical intervention. Patient has been putting off meeting with orthopedics for some time now due to wanting her mental state to be better. I feel she may be closer to contacting ortho, as she recognizes the damage to her lower back and left hip from compensating. The patient asks for shoe inserts, however we do not have them in clinic and I advised the patient should would benefit from meeting with ortho so they could determine what size to obtain. Patient continues to have some weakness on the R side, presumed to be due to pain. No red flags on exam: no saddle paresthesias, urinary/bladder incontinence, or radicular pain. Negative straight leg test.  - Norco and Flexeril refilled - Discussed and provided written information about ortho f/u

## 2014-06-21 ENCOUNTER — Telehealth: Payer: Self-pay | Admitting: Psychology

## 2014-06-21 NOTE — Telephone Encounter (Signed)
Called patient to let her know she was put in my schedule during a time I don't have available.  I handle all of my own scheduling.  Left a VM letting her know I needed to cancel this appointment and asked her to call me to reschedule.

## 2014-06-23 ENCOUNTER — Ambulatory Visit: Payer: Medicaid Other | Admitting: Psychology

## 2014-06-28 ENCOUNTER — Ambulatory Visit: Payer: Medicaid Other | Admitting: Family Medicine

## 2014-07-18 ENCOUNTER — Encounter: Payer: Self-pay | Admitting: Family Medicine

## 2014-07-18 ENCOUNTER — Ambulatory Visit (INDEPENDENT_AMBULATORY_CARE_PROVIDER_SITE_OTHER): Payer: Self-pay | Admitting: Family Medicine

## 2014-07-18 VITALS — BP 115/42 | HR 87 | Temp 98.5°F | Ht 64.0 in | Wt 167.5 lb

## 2014-07-18 DIAGNOSIS — Z7189 Other specified counseling: Secondary | ICD-10-CM

## 2014-07-18 DIAGNOSIS — M25551 Pain in right hip: Secondary | ICD-10-CM

## 2014-07-18 DIAGNOSIS — G8929 Other chronic pain: Secondary | ICD-10-CM

## 2014-07-18 DIAGNOSIS — F3162 Bipolar disorder, current episode mixed, moderate: Secondary | ICD-10-CM

## 2014-07-18 MED ORDER — BECLOMETHASONE DIPROPIONATE 80 MCG/ACT IN AERS: 1.0000 | INHALATION_SPRAY | Freq: Two times a day (BID) | RESPIRATORY_TRACT | Status: DC

## 2014-07-18 MED ORDER — FLUOXETINE HCL 40 MG PO CAPS: 40.0000 mg | ORAL_CAPSULE | Freq: Every day | ORAL | Status: DC

## 2014-07-18 MED ORDER — HYDROCODONE-ACETAMINOPHEN 5-325 MG PO TABS: 1.0000 | ORAL_TABLET | Freq: Three times a day (TID) | ORAL | Status: DC | PRN

## 2014-07-18 MED ORDER — ALBUTEROL SULFATE HFA 108 (90 BASE) MCG/ACT IN AERS: 2.0000 | INHALATION_SPRAY | Freq: Four times a day (QID) | RESPIRATORY_TRACT | Status: DC | PRN

## 2014-07-18 MED ORDER — LAMOTRIGINE 100 MG PO TABS: 200.0000 mg | ORAL_TABLET | Freq: Every day | ORAL | Status: DC

## 2014-07-18 MED ORDER — CYCLOBENZAPRINE HCL 10 MG PO TABS: ORAL_TABLET | ORAL | Status: DC

## 2014-07-18 NOTE — Progress Notes (Signed)
Patient ID: Stacey Wong, female   DOB: 10/09/1980, 34 y.o.   MRN: [id number]    Subjective: OA:[id number] refill for R hip pain HPI: Patient is a 34 y.o. female with presenting to clinic today for f/u on right hip pain and medication refill.  1. Right congenital hip dysplasia: Patient currently on Norco and Flexeril (for lower back spasms). The pain is approximately a 10/10 without medications and has been interfering with sleep. She notes the pain is sharp and most severe in the R hip;  6/10 with medication, 10/10 this AM prior to medication.  R knee pain is a 9/10 without mediation and improves to a 6-7/10. She occasionally has left hip pain that is a 7/10 without medication and a 4/10 with medication and mild, occasional dull lumbar pain. She continues to have some pain down her right leg but stating it's not "a shooting pain."  She is aware she is most likely doing more harm the longer she puts off meeting with the orthopedist again.   2. Deppresion/Bipolar: Stable per the patient. Continues to take Lamictal and Prozac. PHQ-9: 10, somewhat difficult. Patient canceled appt with Dr. Gwenlyn Saran, states she plans to make another appt.  Social History: patient continues to smoke.  Health Maintenance: patient due to pap smear, she missed her last appt for pap smear.  ROS: All other systems reviewed and are negative.  Past Medical History Patient Active Problem List   Diagnosis Date Noted  . Encounter for chronic pain management 06/16/2014  . Healthcare maintenance 11/10/2013  . Ganglion cyst 06/16/2013  . Numbness and tingling in hands 01/16/2013  . 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 HTN (hypertension) 06/20/2011  . Right hip pain 05/27/2011  . Back pain 05/09/2011  . Panic disorder 09/13/2010  . History of alcohol abuse 05/16/2010  . Asthma 05/03/2010  . GERD 05/03/2010  . SHOULDER PAIN, RIGHT, CHRONIC 04/10/2010  . Tobacco abuse counseling 01/14/2009  . Bipolar disorder 05/11/2007  .  AMENORRHEA 05/11/2007    Medications- reviewed and updated Current Outpatient Prescriptions  Medication Sig Dispense Refill  . albuterol (PROVENTIL HFA;VENTOLIN HFA) 108 (90 BASE) MCG/ACT inhaler Inhale 2 puffs into the lungs every 6 (six) hours as needed. For shortness of breath. 1 Inhaler 3  . beclomethasone (QVAR) 80 MCG/ACT inhaler Inhale 1 puff into the lungs 2 (two) times daily. 1 Inhaler 12  . cyclobenzaprine (FLEXERIL) 10 MG tablet TAKE 1 TABLET BY MOUTH THREE TIMES DAILY AS NEEDED FOR SPASMS 60 tablet 0  . FLUoxetine (PROZAC) 40 MG capsule Take 1 capsule (40 mg total) by mouth daily. 90 capsule 3  . hydrochlorothiazide (HYDRODIURIL) 25 MG tablet Take 1 tablet (25 mg total) by mouth daily. 30 tablet 3  . HYDROcodone-acetaminophen (NORCO) 5-325 MG per tablet Take 1 tablet by mouth 3 (three) times daily as needed for severe pain. 90 tablet 0  . lamoTRIgine (LAMICTAL) 100 MG tablet Take 2 tablets (200 mg total) by mouth daily. 60 tablet 2  . meloxicam (MOBIC) 15 MG tablet Take 1 tablet (15 mg total) by mouth daily. 30 tablet 0   No current facility-administered medications for this visit.    Objective: Office vital signs reviewed. BP 115/42 mmHg  Pulse 87  Temp(Src) 98.5 F (36.9 C) (Oral)  Ht 5\' 4"  (1.626 m)  Wt 167 lb 8 oz (75.978 kg)  BMI 28.74 kg/m2  LMP 07/16/2014   Physical Examination:  General: Awake, alert, well- nourished, NAD ENMT:  MMM, Oropharynx clear without erythema Eyes: Conjunctiva  non-injected. PERRL.  Cardio: RRR, no m/r/g noted.  Pulm: No increased WOB.  CTAB, without wheezes, rhonchi or crackles noted.  MSK: Visible leg length discrepancy (R shorter than L). Normal gait and station. No tenderness over the spinal processes or over the right paraspinal muscles. No tenderness over the left hip, moderate tenderness to palpation over the right hip. Full ROM at the hips. 4+/5 hip flexion, extension and internal rotation and keen flexion/extension on the right,  5/5 on the left.  Skin: dry, intact, no rashes or lesions Neuro: Strength and sensation grossly intact, DTRs 2+/4  Assessment/Plan: Right hip pain Patient's chronic pain appears to be stable with Flexeril and Norco. She occasionally has pain in the left LE but denies any radicular symptoms.  - Refilled Rx for Vicodin and Flexeril today  - Patient up to date on UDS and pain contract - Discussed once again that meeting with Dr. Caralyn Guile, ortho, would be the best options, even if she does not wish to undergo surgery at this current time. - Patient to f/u in 1 month     No orders of the defined types were placed in this encounter.    Meds ordered this encounter  Medications  . lamoTRIgine (LAMICTAL) 100 MG tablet    Sig: Take 2 tablets (200 mg total) by mouth daily.    Dispense:  60 tablet    Refill:  2  . albuterol (PROVENTIL HFA;VENTOLIN HFA) 108 (90 BASE) MCG/ACT inhaler    Sig: Inhale 2 puffs into the lungs every 6 (six) hours as needed. For shortness of breath.    Dispense:  1 Inhaler    Refill:  3  . beclomethasone (QVAR) 80 MCG/ACT inhaler    Sig: Inhale 1 puff into the lungs 2 (two) times daily.    Dispense:  1 Inhaler    Refill:  12  . cyclobenzaprine (FLEXERIL) 10 MG tablet    Sig: TAKE 1 TABLET BY MOUTH THREE TIMES DAILY AS NEEDED FOR SPASMS    Dispense:  60 tablet    Refill:  0  . FLUoxetine (PROZAC) 40 MG capsule    Sig: Take 1 capsule (40 mg total) by mouth daily.    Dispense:  90 capsule    Refill:  3  . HYDROcodone-acetaminophen (NORCO) 5-325 MG per tablet    Sig: Take 1 tablet by mouth 3 (three) times daily as needed for severe pain.    Dispense:  90 tablet    Refill:  [place] PGY-1, [place]

## 2014-07-18 NOTE — Patient Instructions (Addendum)
It was good to see you again. Please make a follow up appointment with me for a pap smear  Please follow up with me in 1 month for your hip pain. Please consider contacting orthopedics, as I do not want you to do more damage in the future (you're too young for all this pain).

## 2014-07-18 NOTE — Assessment & Plan Note (Signed)
Patient's chronic pain appears to be stable with Flexeril and Norco. She occasionally has pain in the left LE but denies any radicular symptoms.  - Refilled Rx for Vicodin and Flexeril today  - Patient up to date on UDS and pain contract - Discussed once again that meeting with Dr. Caralyn Guile, ortho, would be the best options, even if she does not wish to undergo surgery at this current time. - Patient to f/u in 1 month

## 2014-07-29 ENCOUNTER — Emergency Department (HOSPITAL_COMMUNITY)
Admission: EM | Admit: 2014-07-29 | Discharge: 2014-07-29 | Disposition: A | Discharge status: Home / Self Care | Payer: Medicaid Other | Attending: Emergency Medicine | Admitting: Emergency Medicine

## 2014-07-29 ENCOUNTER — Encounter (HOSPITAL_COMMUNITY): Payer: Self-pay | Admitting: *Deleted

## 2014-07-29 DIAGNOSIS — I10 Essential (primary) hypertension: Secondary | ICD-10-CM | POA: Diagnosis not present

## 2014-07-29 DIAGNOSIS — Z7951 Long term (current) use of inhaled steroids: Secondary | ICD-10-CM | POA: Insufficient documentation

## 2014-07-29 DIAGNOSIS — Z79899 Other long term (current) drug therapy: Secondary | ICD-10-CM | POA: Insufficient documentation

## 2014-07-29 DIAGNOSIS — Z791 Long term (current) use of non-steroidal anti-inflammatories (NSAID): Secondary | ICD-10-CM | POA: Diagnosis not present

## 2014-07-29 DIAGNOSIS — J029 Acute pharyngitis, unspecified: Secondary | ICD-10-CM | POA: Insufficient documentation

## 2014-07-29 DIAGNOSIS — H9203 Otalgia, bilateral: Secondary | ICD-10-CM | POA: Diagnosis not present

## 2014-07-29 DIAGNOSIS — F319 Bipolar disorder, unspecified: Secondary | ICD-10-CM | POA: Diagnosis not present

## 2014-07-29 DIAGNOSIS — J45909 Unspecified asthma, uncomplicated: Secondary | ICD-10-CM | POA: Insufficient documentation

## 2014-07-29 DIAGNOSIS — M791 Myalgia: Secondary | ICD-10-CM | POA: Insufficient documentation

## 2014-07-29 DIAGNOSIS — Z720 Tobacco use: Secondary | ICD-10-CM | POA: Diagnosis not present

## 2014-07-29 LAB — RAPID STREP SCREEN (MED CTR MEBANE ONLY): STREPTOCOCCUS, GROUP A SCREEN (DIRECT): NEGATIVE

## 2014-07-29 MED ORDER — ACETAMINOPHEN 500 MG PO TABS: 1000.0000 mg | ORAL_TABLET | Freq: Once | ORAL | Status: DC

## 2014-07-29 MED ORDER — IBUPROFEN 400 MG PO TABS
800.0000 mg | ORAL_TABLET | Freq: Once | ORAL | Status: AC
  Administered 2014-07-29: 800 mg via ORAL
  Filled 2014-07-29: qty 2

## 2014-07-29 MED ORDER — HYDROCODONE-ACETAMINOPHEN 7.5-325 MG/15ML PO SOLN: 10.0000 mL | Freq: Four times a day (QID) | ORAL | Status: DC | PRN

## 2014-07-29 MED ORDER — IBUPROFEN 800 MG PO TABS: 800.0000 mg | ORAL_TABLET | Freq: Three times a day (TID) | ORAL | Status: DC | PRN

## 2014-07-29 NOTE — ED Provider Notes (Signed)
CSN: [id number]     Arrival date & time 07/29/14  0254 History   This chart was scribed for non-physician practitioner, Clayton Bibles, PA-C, working with Blanchie Dessert, MD by Ladene Artist, ED Scribe. This patient was seen in room TR05C/TR05C and the patient's care was started at 9:58 AM.   Chief Complaint  Patient presents with  . Otalgia    Bilateral  . Sore Throat  . Generalized Body Aches   The history is provided by the patient. No language interpreter was used.   HPI Comments: Stacey Wong is a 34 y.o. female who presents to the Emergency Department complaining of gradually worsening sore throat since yesterday. She reports associated HA, bilateral ear pain, mild congestion, nausea, generalized body aches. Pt denies cough, SOB, chest pain, abdominal pain, vomiting, diarrhea. Pt has tried cold and sinus medication without relief. No other alleviating or aggravating factors.   Past Medical History  Diagnosis Date  . Asthma   . Hypertension   . Bipolar 1 disorder    Past Surgical History  Procedure Laterality Date  . Cholecystectomy     History reviewed. No pertinent family history. History  Substance Use Topics  . Smoking status: Current Every Day Smoker -- 1.00 packs/day    Types: Cigarettes  . Smokeless tobacco: Not on file     Comment: decreased smoking  . Alcohol Use: Yes     Comment: rare   OB History    No data available     Review of Systems  Constitutional: Positive for fever.  HENT: Positive for congestion, ear pain and sore throat.   Respiratory: Negative for cough and shortness of breath.   Cardiovascular: Negative for chest pain.  Gastrointestinal: Positive for nausea. Negative for vomiting, abdominal pain and diarrhea.  Musculoskeletal: Positive for myalgias.  Skin: Negative for rash.  Allergic/Immunologic: Negative for immunocompromised state.  Neurological: Positive for headaches.  Hematological: Does not bruise/bleed easily.   Allergies  Review  of patient's allergies indicates no known allergies.  Home Medications   Prior to Admission medications   Medication Sig Start Date End Date Taking? Authorizing Provider  albuterol (PROVENTIL HFA;VENTOLIN HFA) 108 (90 BASE) MCG/ACT inhaler Inhale 2 puffs into the lungs every 6 (six) hours as needed. For shortness of breath. 07/18/14   Archie Patten, MD  beclomethasone (QVAR) 80 MCG/ACT inhaler Inhale 1 puff into the lungs 2 (two) times daily. 07/18/14   Archie Patten, MD  cyclobenzaprine (FLEXERIL) 10 MG tablet TAKE 1 TABLET BY MOUTH THREE TIMES DAILY AS NEEDED FOR SPASMS 07/18/14   Archie Patten, MD  FLUoxetine (PROZAC) 40 MG capsule Take 1 capsule (40 mg total) by mouth daily. 07/18/14   Archie Patten, MD  hydrochlorothiazide (HYDRODIURIL) 25 MG tablet Take 1 tablet (25 mg total) by mouth daily. 03/18/12   Kandis Nab, MD  HYDROcodone-acetaminophen (NORCO) 5-325 MG per tablet Take 1 tablet by mouth 3 (three) times daily as needed for severe pain. 07/18/14   Archie Patten, MD  lamoTRIgine (LAMICTAL) 100 MG tablet Take 2 tablets (200 mg total) by mouth daily. 07/18/14   Archie Patten, MD  meloxicam (MOBIC) 15 MG tablet Take 1 tablet (15 mg total) by mouth daily. 11/10/13   Archie Patten, MD   BP 127/63 mmHg  Pulse 100  Temp(Src) 100.2 F (37.9 C) (Oral)  Resp 18  Ht 5\' 4"  (1.626 m)  Wt 167 lb (75.751 kg)  BMI 28.65 kg/m2  SpO2 96%  LMP 07/15/2014 Physical Exam  Constitutional: She appears well-developed and well-nourished. No distress.  HENT:  Head: Normocephalic and atraumatic.  Right Ear: Tympanic membrane normal.  Left Ear: Tympanic membrane normal.  Mouth/Throat: Posterior oropharyngeal edema and posterior oropharyngeal erythema (mild) present. No oropharyngeal exudate.  Eyes: Conjunctivae are normal.  Neck: Normal range of motion. Neck supple.  Cardiovascular: Normal rate and regular rhythm.   Pulmonary/Chest: Effort normal and breath sounds normal. No  respiratory distress. She has no wheezes. She has no rales.  Neurological: She is alert.  Skin: She is not diaphoretic.  Nursing note and vitals reviewed.  ED Course  Procedures (including critical care time) DIAGNOSTIC STUDIES: Oxygen Saturation is 96% on RA, normal by my interpretation.    COORDINATION OF CARE: 10:03 AM-Discussed treatment plan which includes strep screen and Tylenol with pt at bedside and pt agreed to plan.   Labs Review Labs Reviewed  RAPID STREP SCREEN  CULTURE, GROUP A STREP   Imaging Review No results found.   EKG Interpretation None      MDM   Final diagnoses:  Pharyngitis    Afebrile, nontoxic patient with constellation of symptoms suggestive of viral syndrome.  No concerning findings on exam.  No airway concerns.  Discharged home with supportive care, PCP follow up.  Discussed result, findings, treatment, and follow up  with patient.  Pt given return precautions.  Pt verbalizes understanding and agrees with plan.      I personally performed the services described in this documentation, which was scribed in my presence. The recorded information has been reviewed and is accurate.    Clayton Bibles, PA-C 07/29/14 1051  Blanchie Dessert, MD 07/30/14 [phone]

## 2014-07-29 NOTE — ED Notes (Signed)
Declined W/C at D/C and was escorted to lobby by RN. 

## 2014-07-29 NOTE — ED Notes (Signed)
Pt reports her ears started to hurt on Thursday morning and now Pt has fever ,sore throat.

## 2014-07-29 NOTE — Discharge Instructions (Signed)
Read the information below.  Use the prescribed medication as directed.  Please discuss all new medications with your pharmacist.  Do not take additional tylenol while taking the prescribed pain medication to avoid overdose.  You may return to the Emergency Department at any time for worsening condition or any new symptoms that concern you.  If you develop high fevers that do not resolve with tylenol or ibuprofen, you have difficulty swallowing or breathing, or you are unable to tolerate fluids by mouth, return to the ER for a recheck.      Pharyngitis Pharyngitis is a sore throat (pharynx). There is redness, pain, and swelling of your throat. HOME CARE   Drink enough fluids to keep your pee (urine) clear or pale yellow.  Only take medicine as told by your doctor.  You may get sick again if you do not take medicine as told. Finish your medicines, even if you start to feel better.  Do not take aspirin.  Rest.  Rinse your mouth (gargle) with salt water ( tsp of salt per 1 qt of water) every 1-2 hours. This will help the pain.  If you are not at risk for choking, you can suck on hard candy or sore throat lozenges. GET HELP IF:  You have large, tender lumps on your neck.  You have a rash.  You cough up green, yellow-brown, or bloody spit. GET HELP RIGHT AWAY IF:   You have a stiff neck.  You drool or cannot swallow liquids.  You throw up (vomit) or are not able to keep medicine or liquids down.  You have very bad pain that does not go away with medicine.  You have problems breathing (not from a stuffy nose). MAKE SURE YOU:   Understand these instructions.  Will watch your condition.  Will get help right away if you are not doing well or get worse. Document Released: 10/02/2007 Document Revised: 02/03/2013 Document Reviewed: 12/21/2012 [facility] Patient Information 2015 [place], [place]. This information is not intended to replace advice given to you by your health care  provider. Make sure you discuss any questions you have with your health care provider.  Viral Infections A virus is a type of germ. Viruses can cause:  Minor sore throats.  Aches and pains.  Headaches.  Runny nose.  Rashes.  Watery eyes.  Tiredness.  Coughs.  Loss of appetite.  Feeling sick to your stomach (nausea).  Throwing up (vomiting).  Watery poop (diarrhea). HOME CARE   Only take medicines as told by your doctor.  Drink enough water and fluids to keep your pee (urine) clear or pale yellow. Sports drinks are a good choice.  Get plenty of rest and eat healthy. Soups and broths with crackers or rice are fine. GET HELP RIGHT AWAY IF:   You have a very bad headache.  You have shortness of breath.  You have chest pain or neck pain.  You have an unusual rash.  You cannot stop throwing up.  You have watery poop that does not stop.  You cannot keep fluids down.  You or your child has a temperature by mouth above 102 F (38.9 C), not controlled by medicine.  Your baby is older than 3 months with a rectal temperature of 102 F (38.9 C) or higher.  Your baby is 84 months old or younger with a rectal temperature of 100.4 F (38 C) or higher. MAKE SURE YOU:   Understand these instructions.  Will watch this condition.  Will  get help right away if you are not doing well or get worse. Document Released: 03/28/2008 Document Revised: 07/08/2011 Document Reviewed: 08/21/2010 [facility] Patient Information 2015 [place], [place]. This information is not intended to replace advice given to you by your health care provider. Make sure you discuss any questions you have with your health care provider.

## 2014-08-01 LAB — CULTURE, GROUP A STREP

## 2014-08-02 ENCOUNTER — Encounter (HOSPITAL_COMMUNITY): Payer: Self-pay

## 2014-08-02 ENCOUNTER — Emergency Department (HOSPITAL_COMMUNITY)
Admission: EM | Admit: 2014-08-02 | Discharge: 2014-08-02 | Disposition: A | Discharge status: Home / Self Care | Payer: Medicaid Other | Attending: Emergency Medicine | Admitting: Emergency Medicine

## 2014-08-02 DIAGNOSIS — J029 Acute pharyngitis, unspecified: Secondary | ICD-10-CM | POA: Diagnosis not present

## 2014-08-02 DIAGNOSIS — Z791 Long term (current) use of non-steroidal anti-inflammatories (NSAID): Secondary | ICD-10-CM | POA: Diagnosis not present

## 2014-08-02 DIAGNOSIS — Z7951 Long term (current) use of inhaled steroids: Secondary | ICD-10-CM | POA: Diagnosis not present

## 2014-08-02 DIAGNOSIS — F319 Bipolar disorder, unspecified: Secondary | ICD-10-CM | POA: Diagnosis not present

## 2014-08-02 DIAGNOSIS — Z720 Tobacco use: Secondary | ICD-10-CM | POA: Insufficient documentation

## 2014-08-02 DIAGNOSIS — I10 Essential (primary) hypertension: Secondary | ICD-10-CM | POA: Insufficient documentation

## 2014-08-02 DIAGNOSIS — Z79899 Other long term (current) drug therapy: Secondary | ICD-10-CM | POA: Diagnosis not present

## 2014-08-02 DIAGNOSIS — J45909 Unspecified asthma, uncomplicated: Secondary | ICD-10-CM | POA: Insufficient documentation

## 2014-08-02 MED ORDER — PENICILLIN G BENZATHINE 1200000 UNIT/2ML IM SUSP
1.2000 10*6.[IU] | Freq: Once | INTRAMUSCULAR | Status: AC
  Administered 2014-08-02: 1.2 10*6.[IU] via INTRAMUSCULAR
  Filled 2014-08-02: qty 2

## 2014-08-02 NOTE — Discharge Instructions (Signed)

## 2014-08-02 NOTE — ED Provider Notes (Signed)
CSN: [id number]     Arrival date & time 08/02/14  0359 History   First MD Initiated Contact with Patient 08/02/14 0559     Chief Complaint  Patient presents with  . Sore Throat  . Dysphagia     (Consider location/radiation/quality/duration/timing/severity/associated sxs/prior Treatment) HPI Comments: Patient presents to the emergency department with chief complaint of sore throat. She states that she was seen here a couple of days ago for the same. States that she has had worsening symptoms. Reports tonsillar exudates today. Reports intermittent fever. She also complaints of difficulty swallowing secondary to pain. She has tried taking liquid Vicodin with some relief. Symptoms are aggravated with swallowing.  The history is provided by the patient. No language interpreter was used.    Past Medical History  Diagnosis Date  . Asthma   . Hypertension   . Bipolar 1 disorder    Past Surgical History  Procedure Laterality Date  . Cholecystectomy     History reviewed. No pertinent family history. History  Substance Use Topics  . Smoking status: Current Every Day Smoker -- 1.00 packs/day    Types: Cigarettes  . Smokeless tobacco: Not on file     Comment: decreased smoking  . Alcohol Use: Yes     Comment: rare   OB History    No data available     Review of Systems  Constitutional: Positive for fever and chills.  HENT: Positive for sore throat. Negative for drooling and postnasal drip.   Respiratory: Negative for cough and shortness of breath.   Gastrointestinal: Negative for nausea, vomiting, diarrhea and constipation.      Allergies  Review of patient's allergies indicates no known allergies.  Home Medications   Prior to Admission medications   Medication Sig Start Date End Date Taking? Authorizing Provider  albuterol (PROVENTIL HFA;VENTOLIN HFA) 108 (90 BASE) MCG/ACT inhaler Inhale 2 puffs into the lungs every 6 (six) hours as needed. For shortness of breath. 07/18/14   Yes Archie Patten, MD  beclomethasone (QVAR) 80 MCG/ACT inhaler Inhale 1 puff into the lungs 2 (two) times daily. 07/18/14  Yes Archie Patten, MD  cyclobenzaprine (FLEXERIL) 10 MG tablet TAKE 1 TABLET BY MOUTH THREE TIMES DAILY AS NEEDED FOR SPASMS 07/18/14  Yes Archie Patten, MD  FLUoxetine (PROZAC) 40 MG capsule Take 1 capsule (40 mg total) by mouth daily. 07/18/14  Yes Archie Patten, MD  HYDROcodone-acetaminophen (NORCO/VICODIN) 5-325 MG per tablet Take 1 tablet by mouth every 6 (six) hours as needed for moderate pain.   Yes Historical Provider, MD  ibuprofen (ADVIL,MOTRIN) 800 MG tablet Take 1 tablet (800 mg total) by mouth every 8 (eight) hours as needed for mild pain or moderate pain. 07/29/14  Yes Clayton Bibles, PA-C  lamoTRIgine (LAMICTAL) 100 MG tablet Take 2 tablets (200 mg total) by mouth daily. 07/18/14  Yes Archie Patten, MD  Olive Oil (SWEET OIL) OIL Place 1 drop into both ears daily.   Yes Historical Provider, MD  hydrochlorothiazide (HYDRODIURIL) 25 MG tablet Take 1 tablet (25 mg total) by mouth daily. Patient not taking: Reported on 08/02/2014 03/18/12   Kandis Nab, MD  HYDROcodone-acetaminophen (HYCET) 7.5-325 mg/15 ml solution Take 10 mLs by mouth 4 (four) times daily as needed for moderate pain or severe pain. Patient not taking: Reported on 08/02/2014 07/29/14   Clayton Bibles, PA-C  meloxicam (MOBIC) 15 MG tablet Take 1 tablet (15 mg total) by mouth daily. Patient not taking: Reported on 08/02/2014 11/10/13  Archie Patten, MD   BP 120/64 mmHg  Pulse 85  Temp(Src) 98.2 F (36.8 C) (Oral)  Resp 16  Ht 5\' 4"  (1.626 m)  Wt 167 lb (75.751 kg)  BMI 28.65 kg/m2  SpO2 95%  LMP 07/15/2014 Physical Exam  Constitutional: She is oriented to person, place, and time. She appears well-developed and well-nourished.  HENT:  Head: Normocephalic and atraumatic.  Oropharynx is erythematous, with some tonsillar exudates, no evidence of abscess, airway is intact, no stridor  Eyes:  Conjunctivae and EOM are normal. Pupils are equal, round, and reactive to light.  Neck: Normal range of motion. Neck supple.  Cardiovascular: Normal rate and regular rhythm.  Exam reveals no gallop and no friction rub.   No murmur heard. Pulmonary/Chest: Effort normal and breath sounds normal. No respiratory distress. She has no wheezes. She has no rales. She exhibits no tenderness.  Abdominal: Soft. Bowel sounds are normal. She exhibits no distension and no mass. There is no tenderness. There is no rebound and no guarding.  Musculoskeletal: Normal range of motion. She exhibits no edema or tenderness.  Lymphadenopathy:    She has cervical adenopathy.  Neurological: She is alert and oriented to person, place, and time.  Skin: Skin is warm and dry.  Psychiatric: She has a normal mood and affect. Her behavior is normal. Judgment and thought content normal.  Nursing note and vitals reviewed.   ED Course  Procedures (including critical care time) Labs Review Labs Reviewed - No data to display  Imaging Review No results found.   EKG Interpretation None      MDM   Final diagnoses:  Pharyngitis    Patient with presumed strep throat. Will treat with penicillin IM. Patient has had fever, has tonsillar exudates, and positive cervical adenopathy. Will treat empirically. Discharge to home.   Montine Circle, PA-C 08/02/14 2263  Everlene Balls, MD 08/02/14 [phone]

## 2014-08-02 NOTE — ED Notes (Signed)
Pt left before receiving discharge instructions. Witnessed by Network engineer and tech walking out of ED yelling we must have forgotten about her. Discharge instructions will placed at nurse first. Attempted to call patient back but no answer at provided phone number.

## 2014-08-02 NOTE — ED Notes (Signed)
Reports feels swollen in throat.

## 2014-08-02 NOTE — ED Notes (Signed)
Sore throat, ear pain, headache, dental pain, seen last week for same and no improvement.

## 2014-08-17 ENCOUNTER — Ambulatory Visit (INDEPENDENT_AMBULATORY_CARE_PROVIDER_SITE_OTHER): Payer: Medicaid Other | Admitting: Family Medicine

## 2014-08-17 ENCOUNTER — Encounter: Payer: Self-pay | Admitting: Family Medicine

## 2014-08-17 VITALS — BP 118/66 | HR 81 | Temp 98.3°F | Ht 64.0 in | Wt 161.0 lb

## 2014-08-17 DIAGNOSIS — M25551 Pain in right hip: Secondary | ICD-10-CM

## 2014-08-17 DIAGNOSIS — Z7189 Other specified counseling: Secondary | ICD-10-CM

## 2014-08-17 DIAGNOSIS — G8929 Other chronic pain: Secondary | ICD-10-CM

## 2014-08-17 MED ORDER — HYDROCODONE-ACETAMINOPHEN 5-325 MG PO TABS: 1.0000 | ORAL_TABLET | Freq: Four times a day (QID) | ORAL | Status: DC | PRN

## 2014-08-17 MED ORDER — CYCLOBENZAPRINE HCL 10 MG PO TABS: ORAL_TABLET | ORAL | Status: DC

## 2014-08-17 NOTE — Patient Instructions (Signed)
It was good to see you Please follow up with me for your scheduled visit next month or sooner as needed

## 2014-08-17 NOTE — Assessment & Plan Note (Signed)
Patient having a hard time describing her pain today, however notes it is stable (no better or worse). She recognizes this is significantly impeding on her life even with medication and states she "needs to have surgery." She has told me this in the past, therefore I am not confident that she will follow up currently. - Refilled hydrocodone-APAP and Flexeril today - Up to date on pain contract and UDS - Patient to f/u in 1 month  - Provided information for Dr. Angus Palms office.

## 2014-08-17 NOTE — Progress Notes (Signed)
Patient ID: NEZIAH VOGELGESANG, female   DOB: Sep 26, 1980, 34 y.o.   MRN: [id number]      Subjective: CC: hip/leg pain  HPI: Patient is a 34 y.o. female with a past medical history of R congential hip dysplasia presenting to clinic today for mediation refill.  She is currently on hydrocodone- APAP and Flexeril. States the pain ranges from a 6-10/10 and is currently an 8/10.  She notes the pain is sharp and most severe in the R hip. Constant aching pain in her R thigh, knee and calf (difficult to note whether it is radiating but notes that is is not sharp or burning in nature).  She occasionally has left hip pain and dull lumbar pain. As we talk about possibly going to see an orthopedist she begins to cry about how her life has been affected by this (she's most concerned with being unable to have intercourse without being "stuck" in the same position for 20-30 minutes afterwards).   Social History: patient continues to smoke  Health Maintenance: Pt has appt for pap smear scheduled for me  ROS: All other systems reviewed and are negative.  Past Medical History Patient Active Problem List   Diagnosis Date Noted  . Encounter for chronic pain management 06/16/2014  . Healthcare maintenance 11/10/2013  . Ganglion cyst 06/16/2013  . Numbness and tingling in hands 01/16/2013  . 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 HTN (hypertension) 06/20/2011  . Right hip pain 05/27/2011  . Back pain 05/09/2011  . Panic disorder 09/13/2010  . History of alcohol abuse 05/16/2010  . Asthma 05/03/2010  . GERD 05/03/2010  . SHOULDER PAIN, RIGHT, CHRONIC 04/10/2010  . Tobacco abuse counseling 01/14/2009  . Bipolar disorder 05/11/2007  . AMENORRHEA 05/11/2007    Medications- reviewed and updated Current Outpatient Prescriptions  Medication Sig Dispense Refill  . albuterol (PROVENTIL HFA;VENTOLIN HFA) 108 (90 BASE) MCG/ACT inhaler Inhale 2 puffs into the lungs every 6 (six) hours as needed. For shortness of breath. 1  Inhaler 3  . beclomethasone (QVAR) 80 MCG/ACT inhaler Inhale 1 puff into the lungs 2 (two) times daily. 1 Inhaler 12  . cyclobenzaprine (FLEXERIL) 10 MG tablet TAKE 1 TABLET BY MOUTH THREE TIMES DAILY AS NEEDED FOR SPASMS 60 tablet 0  . FLUoxetine (PROZAC) 40 MG capsule Take 1 capsule (40 mg total) by mouth daily. 90 capsule 3  . hydrochlorothiazide (HYDRODIURIL) 25 MG tablet Take 1 tablet (25 mg total) by mouth daily. (Patient not taking: Reported on 08/02/2014) 30 tablet 3  . HYDROcodone-acetaminophen (HYCET) 7.5-325 mg/15 ml solution Take 10 mLs by mouth 4 (four) times daily as needed for moderate pain or severe pain. (Patient not taking: Reported on 08/02/2014) 150 mL 0  . HYDROcodone-acetaminophen (NORCO/VICODIN) 5-325 MG per tablet Take 1 tablet by mouth every 6 (six) hours as needed for moderate pain. 90 tablet 0  . ibuprofen (ADVIL,MOTRIN) 800 MG tablet Take 1 tablet (800 mg total) by mouth every 8 (eight) hours as needed for mild pain or moderate pain. 15 tablet 0  . lamoTRIgine (LAMICTAL) 100 MG tablet Take 2 tablets (200 mg total) by mouth daily. 60 tablet 2  . meloxicam (MOBIC) 15 MG tablet Take 1 tablet (15 mg total) by mouth daily. (Patient not taking: Reported on 08/02/2014) 30 tablet 0  . Olive Oil (SWEET OIL) OIL Place 1 drop into both ears daily.     No current facility-administered medications for this visit.    Objective: Office vital signs reviewed. BP 118/66 mmHg  Pulse 81  Temp(Src) 98.3 F (36.8 C) (Oral)  Ht 5\' 4"  (1.626 m)  Wt 161 lb (73.029 kg)  BMI 27.62 kg/m2  LMP 07/15/2014   Physical Examination:  General: Awake but appears fatigued, well nourished, NAD Extremities: Normal gait and station. No tenderness over the spinal processes or over the right paraspinal muscles. No tenderness over the left hip, minimal tenderness to palpation over the right hip and right sacrum. Full ROM at the hips. On the R: 4+/5 hip flexion, extension and internal rotation and keen  flexion/extension, 5/5 on the left.  Visible leg length discrepancy (R shorter than L).  Neuro: Sensation grossly intact, patellar  DTRs 2/4  Assessment/Plan: Right hip pain Patient having a hard time describing her pain today, however notes it is stable (no better or worse). She recognizes this is significantly impeding on her life even with medication and states she "needs to have surgery." She has told me this in the past, therefore I am not confident that she will follow up currently. - Refilled hydrocodone-APAP and Flexeril today - Up to date on pain contract and UDS - Patient to f/u in 1 month  - Provided information for Dr. Angus Palms office.     No orders of the defined types were placed in this encounter.    Meds ordered this encounter  Medications  . cyclobenzaprine (FLEXERIL) 10 MG tablet    Sig: TAKE 1 TABLET BY MOUTH THREE TIMES DAILY AS NEEDED FOR SPASMS    Dispense:  60 tablet    Refill:  0  . HYDROcodone-acetaminophen (NORCO/VICODIN) 5-325 MG per tablet    Sig: Take 1 tablet by mouth every 6 (six) hours as needed for moderate pain.    Dispense:  90 tablet    Refill:  [place] PGY-1, [place]

## 2014-08-22 NOTE — Progress Notes (Signed)
I was preceptor for this office visit.  

## 2014-08-30 ENCOUNTER — Other Ambulatory Visit (HOSPITAL_COMMUNITY)
Admission: RE | Admit: 2014-08-30 | Discharge: 2014-08-30 | Disposition: A | Discharge status: Home / Self Care | Payer: Medicaid Other | Source: Ambulatory Visit | Attending: Family Medicine | Admitting: Family Medicine

## 2014-08-30 ENCOUNTER — Encounter: Payer: Self-pay | Admitting: Family Medicine

## 2014-08-30 ENCOUNTER — Ambulatory Visit (INDEPENDENT_AMBULATORY_CARE_PROVIDER_SITE_OTHER): Payer: Medicaid Other | Admitting: Family Medicine

## 2014-08-30 VITALS — BP 120/64 | HR 78 | Temp 97.8°F | Ht 64.0 in | Wt 150.0 lb

## 2014-08-30 DIAGNOSIS — F3162 Bipolar disorder, current episode mixed, moderate: Secondary | ICD-10-CM | POA: Diagnosis not present

## 2014-08-30 DIAGNOSIS — Z1151 Encounter for screening for human papillomavirus (HPV): Secondary | ICD-10-CM | POA: Insufficient documentation

## 2014-08-30 DIAGNOSIS — Z0000 Encounter for general adult medical examination without abnormal findings: Secondary | ICD-10-CM | POA: Diagnosis not present

## 2014-08-30 DIAGNOSIS — Z113 Encounter for screening for infections with a predominantly sexual mode of transmission: Secondary | ICD-10-CM | POA: Diagnosis not present

## 2014-08-30 DIAGNOSIS — Z124 Encounter for screening for malignant neoplasm of cervix: Secondary | ICD-10-CM

## 2014-08-30 DIAGNOSIS — G8929 Other chronic pain: Secondary | ICD-10-CM

## 2014-08-30 DIAGNOSIS — Z7189 Other specified counseling: Secondary | ICD-10-CM

## 2014-08-30 DIAGNOSIS — Z01419 Encounter for gynecological examination (general) (routine) without abnormal findings: Secondary | ICD-10-CM | POA: Insufficient documentation

## 2014-08-30 MED ORDER — HYDROCODONE-ACETAMINOPHEN 5-325 MG PO TABS: 1.0000 | ORAL_TABLET | Freq: Four times a day (QID) | ORAL | Status: DC | PRN

## 2014-08-30 MED ORDER — FLUOXETINE HCL 40 MG PO CAPS: 40.0000 mg | ORAL_CAPSULE | Freq: Every day | ORAL | Status: DC

## 2014-08-30 MED ORDER — LAMOTRIGINE 100 MG PO TABS: 200.0000 mg | ORAL_TABLET | Freq: Every day | ORAL | Status: DC

## 2014-08-30 MED ORDER — CYCLOBENZAPRINE HCL 10 MG PO TABS: ORAL_TABLET | ORAL | Status: DC

## 2014-08-30 NOTE — Patient Instructions (Signed)
It was good to see you again.  I will call you with your results (call if you do not hear by Friday).   Things to do to Keep yourself Healthy - Exercise at least 30-45 minutes a day,  3-4 days a week.  - Eat a low-fat diet with lots of fruits and vegetables, up to 7-9 servings per day. - Seatbelts can save your life. Wear them always. - Smoke detectors on every level of your home, check batteries every year. - Eye Doctor - have an eye exam every 1-2 years - Safe sex - if you may be exposed to STDs, use a condom. - Alcohol If you drink, do it moderately,less than 2 drinks per day. - [place].  Choose someone to speak for you if you are not able. - Depression is common in our stressful world.If you're feeling down or losing interest in things you normally enjoy, please come in for a visit. - Violence - If anyone is threatening or hurting you, please call immediately.

## 2014-08-30 NOTE — Assessment & Plan Note (Signed)
Patient UTD on vaccines. Pap smear, HIV, RPR, GC/chlamydia done today. - discussed smoking cessation  - safe sex - appropriate diet/exercise - always wearing seat belts  - pt to return in 1 month for f/u on pain.

## 2014-08-30 NOTE — Progress Notes (Signed)
Patient ID: Stacey Wong, female   DOB: 1980-10-01, 34 y.o.   MRN: [id number]   34 y.o. year old female presents for well woman/preventative visit and annual GYN examination.  Acute Concerns: None  Diet: "okay" some vegetables and fruits, however fast food ~once ever 2 weeks  Exercise: None due to R hip pain   Sexual/Birth History: LMP 08/23/14, regular menses  G1P1001 Has been in a monogamous relationship with a female x 51yr (however last STD testing was 4 years ago)  Birth Control: Condoms regularly  12 point ROS complete and negative besides: anxiety and pain in hip and LE bilaterally  Social:  History   Social History  . Marital Status: Divorced    Spouse Name: N/A  . Number of Children: N/A  . Years of Education: N/A   Social History Main Topics  . Smoking status: Current Every Day Smoker -- 1.00 packs/day    Types: Cigarettes  . Smokeless tobacco: Not on file     Comment: decreased smoking  . Alcohol Use: 0.0 oz/week    0 Standard drinks or equivalent per week     Comment: rare  . Drug Use: No  . Sexual Activity: Not on file   Other Topics Concern  . None   Social History Narrative    Immunization:  Tdap/TD: 10/27/2004   Cancer Screening:  Pap Smear: 04/10/2010- negative   Mammogram:  N/A  Colonoscopy: N/A  Dexa: N/A  Physical Exam: Blood pressure 120/64, pulse 78, temperature 97.8 F (36.6 C), temperature source Oral, height 5\' 4"  (1.626 m), weight 150 lb (68.04 kg).  GEN: sitting up in NAD, however appears anxious CARDIAC: RRR, no m/r/g noted. 2+ DP pulses  RESP: CTAB without wheezing, crackles, or rhonchi.  GU/GYN:Exam performed in the presence of a chaperone. External genitalia within normal limits.  Vaginal mucosa pink, moist, normal rugae.  Friable cervix with a erythematous area round 12o'clock, no discharged. Som bleeding noted with speculum exam.  Bimanual exam revealed normal, nongravid uterus.  No cervical motion tenderness. No adnexal masses  bilaterally.     ASSESSMENT & PLAN: 34 y.o. female presents for annual well woman/preventative exam and GYN exam. Please see problem specific assessment and plan.

## 2014-08-31 LAB — CERVICOVAGINAL ANCILLARY ONLY
Chlamydia: NEGATIVE
Neisseria Gonorrhea: NEGATIVE

## 2014-08-31 LAB — RPR

## 2014-08-31 LAB — HIV ANTIBODY (ROUTINE TESTING W REFLEX): HIV 1&2 Ab, 4th Generation: NONREACTIVE

## 2014-08-31 NOTE — Progress Notes (Signed)
I was preceptor for this office visit.  

## 2014-09-01 LAB — CYTOLOGY - PAP

## 2014-09-05 ENCOUNTER — Telehealth: Payer: Self-pay | Admitting: *Deleted

## 2014-09-05 NOTE — Telephone Encounter (Signed)
-----   Message from Archie Patten, MD sent at 09/01/2014  4:00 PM EDT ----- Sorry about that Rudi Rummage, the patient is Stacey Wong, Stacey Wong (attached).  Thanks, Crystal  Attempted to contact patient with no answer. Will you please let her know that all her STD testing (HIV, syphilis, gonorrhea, and chlamydia) was negative and her pap smear was normal (I don't like for them to have to wait for a letter in the mail).

## 2014-09-05 NOTE — Telephone Encounter (Signed)
Left message on voicemail for patient to call back. 

## 2014-10-10 ENCOUNTER — Telehealth: Payer: Self-pay | Admitting: *Deleted

## 2014-10-10 NOTE — Telephone Encounter (Signed)
Pts dentist called to see if pt needed to be pre-medicated before procedure because she told them she had a heart murur. Per dr Wendy Poet, he doesn't see a reason why she would need to be pre-medicated. Dentist informed. Jeramine Delis Kennon Holter, CMA

## 2014-10-11 ENCOUNTER — Other Ambulatory Visit: Payer: Self-pay | Admitting: Family Medicine

## 2014-10-11 NOTE — Telephone Encounter (Signed)
Needs refill on hydrocodone Is having a root canal today and is out Her boyfriend called and requested the refill

## 2014-10-11 NOTE — Telephone Encounter (Signed)
Pt received a Rx for hydrocodone from me on 5/17, she has a follow up appt with me on Thursday for this medication. Cannot fill early/without seeing the patient.  Thanks, Archie Patten, MD [facility] Resident  10/11/2014, 7:50 PM

## 2014-10-13 ENCOUNTER — Ambulatory Visit (INDEPENDENT_AMBULATORY_CARE_PROVIDER_SITE_OTHER): Payer: Medicaid Other | Admitting: Family Medicine

## 2014-10-13 ENCOUNTER — Encounter: Payer: Self-pay | Admitting: Family Medicine

## 2014-10-13 VITALS — BP 119/63 | HR 82 | Temp 97.7°F | Ht 64.0 in | Wt 164.0 lb

## 2014-10-13 DIAGNOSIS — M25551 Pain in right hip: Secondary | ICD-10-CM | POA: Diagnosis not present

## 2014-10-13 DIAGNOSIS — G8929 Other chronic pain: Secondary | ICD-10-CM

## 2014-10-13 DIAGNOSIS — F3162 Bipolar disorder, current episode mixed, moderate: Secondary | ICD-10-CM

## 2014-10-13 DIAGNOSIS — Z7189 Other specified counseling: Secondary | ICD-10-CM

## 2014-10-13 MED ORDER — CYCLOBENZAPRINE HCL 10 MG PO TABS: ORAL_TABLET | ORAL | Status: DC

## 2014-10-13 MED ORDER — LAMOTRIGINE 100 MG PO TABS: 200.0000 mg | ORAL_TABLET | Freq: Every day | ORAL | Status: DC

## 2014-10-13 MED ORDER — HYDROCODONE-ACETAMINOPHEN 5-325 MG PO TABS: 1.0000 | ORAL_TABLET | Freq: Four times a day (QID) | ORAL | Status: DC | PRN

## 2014-10-13 NOTE — Patient Instructions (Signed)
Contact Dr. Caralyn Guile, the orthopedist: Address: [address], [place], [place] [address]  [phone]  Most of your problems are due to your hip, if we can fix that, then hopefully other things will fall in place. Follow up in 1 month

## 2014-10-13 NOTE — Progress Notes (Signed)
Patient ID: Stacey Wong, female   DOB: 12/12/1980, 34 y.o.   MRN: [id number]    Subjective: CC: hip/leg pain  HPI: Patient is a 34 y.o. female with a past medical history of R congential hip dysplasia presenting to clinic today for mediation refill. Unfortunately, she showed up late, therefore our visit was shorter than usual.  She is currently on hydrocodone- APAP and Flexeril. The pain is sharp and most severe in the R hip and R lumbrosacral region with constant aching pain in her R thigh, knee and calf.  She notes sharp pains occasionally that sometimes feel like they're starting in her hips and going down to her knees. Intermittently has left hip pain and dull lumbar pain. Has not contacted her orthopedist since the last visit as she states it's because she's dealing with "too much with her father." I point out it sounds like there will never be a great time, but normally recover from this type of procedure is pretty well.   Social History: patient continues to smoke  ROS: All other systems reviewed and are negative.  Past Medical History Patient Active Problem List   Diagnosis Date Noted  . Encounter for chronic pain management 06/16/2014  . Healthcare maintenance 11/10/2013  . Ganglion cyst 06/16/2013  . Numbness and tingling in hands 01/16/2013  . 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 HTN (hypertension) 06/20/2011  . Right hip pain 05/27/2011  . Back pain 05/09/2011  . Panic disorder 09/13/2010  . History of alcohol abuse 05/16/2010  . Asthma 05/03/2010  . GERD 05/03/2010  . SHOULDER PAIN, RIGHT, CHRONIC 04/10/2010  . Tobacco abuse counseling 01/14/2009  . Bipolar disorder 05/11/2007  . AMENORRHEA 05/11/2007    Medications- reviewed and updated Current Outpatient Prescriptions  Medication Sig Dispense Refill  . albuterol (PROVENTIL HFA;VENTOLIN HFA) 108 (90 BASE) MCG/ACT inhaler Inhale 2 puffs into the lungs every 6 (six) hours as needed. For shortness of breath. 1 Inhaler 3    . beclomethasone (QVAR) 80 MCG/ACT inhaler Inhale 1 puff into the lungs 2 (two) times daily. 1 Inhaler 12  . cyclobenzaprine (FLEXERIL) 10 MG tablet TAKE 1 TABLET BY MOUTH THREE TIMES DAILY AS NEEDED FOR SPASMS 60 tablet 0  . FLUoxetine (PROZAC) 40 MG capsule Take 1 capsule (40 mg total) by mouth daily. 90 capsule 3  . hydrochlorothiazide (HYDRODIURIL) 25 MG tablet Take 1 tablet (25 mg total) by mouth daily. (Patient not taking: Reported on 08/02/2014) 30 tablet 3  . HYDROcodone-acetaminophen (HYCET) 7.5-325 mg/15 ml solution Take 10 mLs by mouth 4 (four) times daily as needed for moderate pain or severe pain. (Patient not taking: Reported on 08/02/2014) 150 mL 0  . HYDROcodone-acetaminophen (NORCO/VICODIN) 5-325 MG per tablet Take 1 tablet by mouth every 6 (six) hours as needed for moderate pain. 90 tablet 0  . ibuprofen (ADVIL,MOTRIN) 800 MG tablet Take 1 tablet (800 mg total) by mouth every 8 (eight) hours as needed for mild pain or moderate pain. 15 tablet 0  . lamoTRIgine (LAMICTAL) 100 MG tablet Take 2 tablets (200 mg total) by mouth daily. 60 tablet 2  . meloxicam (MOBIC) 15 MG tablet Take 1 tablet (15 mg total) by mouth daily. (Patient not taking: Reported on 08/02/2014) 30 tablet 0  . Olive Oil (SWEET OIL) OIL Place 1 drop into both ears daily.     No current facility-administered medications for this visit.    Objective: Office vital signs reviewed. BP 119/63 mmHg  Pulse 82  Temp(Src) 97.7 F (36.5  C) (Oral)  Ht 5\' 4"  (1.626 m)  Wt 164 lb (74.39 kg)  BMI 28.14 kg/m2  LMP 09/22/2014   Physical Examination:  General: Sitting up in NAD, occasionally tearful. Extremities: Normal gait and station. No tenderness over the spinal processes or paraspinal muscles. No tenderness over the left hip. Moderate tenderness to palpation over the right hip and right sacrum. Full ROM at the hips. On the R: 4+/5 hip flexion/extension and keen flexion/extension, 5/5 on the left.  Visible leg length  discrepancy (R shorter than L).  Neuro: Sensation grossly intact, patellar DTRs 2/4. Negative SLR.  Assessment/Plan: Right hip pain Pain and physical exam appear stable from previous visit. We discussed the long term consequences of narcotic use and that fact that we're currently just masking the problem instead of fixing it. Noted that we will begin to start tapering her narcotic regimen down in the next 2 months. I provided her with the information for Dr. Angus Palms office again. She is aware that we will be gradually decreasing her regimen regardless of whether she sees him. Additionally, the patient was 21 minutes late. She was seen at the end of my clinic day as other patients were waiting. Per report, she became very angry and used vulgar language to the front office staff. - Refilled hydrocodone-APAP and Flexeril today - Up to date on pain contract and UDS - F/u in 1 month: advised pt that she would not be seen again if she is this late. Additionally advised her that it was not appropriate to treat the office staff with disrespect. Pt voiced understanding.    No orders of the defined types were placed in this encounter.    Meds ordered this encounter  Medications  . HYDROcodone-acetaminophen (NORCO/VICODIN) 5-325 MG per tablet    Sig: Take 1 tablet by mouth every 6 (six) hours as needed for moderate pain.    Dispense:  90 tablet    Refill:  0  . cyclobenzaprine (FLEXERIL) 10 MG tablet    Sig: TAKE 1 TABLET BY MOUTH THREE TIMES DAILY AS NEEDED FOR SPASMS    Dispense:  60 tablet    Refill:  0  . lamoTRIgine (LAMICTAL) 100 MG tablet    Sig: Take 2 tablets (200 mg total) by mouth daily.    Dispense:  60 tablet    Refill:  [facility] PGY-1, [place]

## 2014-10-13 NOTE — Assessment & Plan Note (Signed)
Pain and physical exam appear stable from previous visit. We discussed the long term consequences of narcotic use and that fact that we're currently just masking the problem instead of fixing it. Noted that we will begin to start tapering her narcotic regimen down in the next 2 months. I provided her with the information for Dr. Angus Palms office again. She is aware that we will be gradually decreasing her regimen regardless of whether she sees him. Additionally, the patient was 21 minutes late. She was seen at the end of my clinic day as other patients were waiting. Per report, she became very angry and used vulgar language to the front office staff. - Refilled hydrocodone-APAP and Flexeril today - Up to date on pain contract and UDS - F/u in 1 month: advised pt that she would not be seen again if she is this late. Additionally advised her that it was not appropriate to treat the office staff with disrespect. Pt voiced understanding.

## 2014-11-11 ENCOUNTER — Telehealth: Payer: Self-pay | Admitting: Family Medicine

## 2014-11-11 NOTE — Telephone Encounter (Signed)
Pt called to confirm her next appt, advised pt she didn't have anything pending, pt scheduled first available for 8/1, wants to know if MD will refill her pain medication and muscle relaxer until her appt.

## 2014-11-11 NOTE — Telephone Encounter (Signed)
Please let the patient know that I cannot refill her Rx without seeing her, especially since we didn't have a full visit last month as she showed up late.  Thanks, Archie Patten, MD [facility] Family Medicine Resident  11/11/2014, 3:03 PM

## 2014-11-14 NOTE — Telephone Encounter (Signed)
Patient informed, has appointment with PCP on 7/21. Patient states she would like new PCP as Dr. Lorenso Courier doesn't like her and wont work with her. FYI to PCP and Jeanett Schlein.

## 2014-11-17 ENCOUNTER — Telehealth: Payer: Self-pay | Admitting: Family Medicine

## 2014-11-17 ENCOUNTER — Encounter: Payer: Medicaid Other | Admitting: Family Medicine

## 2014-11-17 DIAGNOSIS — G8929 Other chronic pain: Secondary | ICD-10-CM

## 2014-11-17 MED ORDER — HYDROCODONE-ACETAMINOPHEN 5-325 MG PO TABS: 1.0000 | ORAL_TABLET | Freq: Four times a day (QID) | ORAL | Status: DC | PRN

## 2014-11-17 MED ORDER — CYCLOBENZAPRINE HCL 10 MG PO TABS: ORAL_TABLET | ORAL | Status: DC

## 2014-11-17 NOTE — Telephone Encounter (Signed)
Ms. Daye was >20 minutes late for her appt today. She was late for her appt last month in June (~20 minutes late) and she was informed at the end of that visit if she were to show up late, she would not be seen. The patient has been out of medication x 2 weeks (she waited until the day her Rx ran out to call and schedule her monthly appt). She was not given any medication to tie her over to this scheduled appt, as in the past, she's been given a Rx then failed to show up for her appt. This has also been highlighted in her pain contract. She was give a 7 day supply of vicodin (#21) and her Flexeril was refilled for the month, #60, to hold her over until her appt on 8/1. The patient was informed this was the last time that she could get any narcotics without being seen (including medication to tie her over to a next appt if she is too late to be seen at one of her visits).   Additionally, given new CDC guidelines, we will start to taper down her amount of narcotics she receives each month.  This was briefly touched on at her last appt when she was advised to follow up with the orthopedist.  Archie Patten, MD [facility] Family Medicine Resident  11/17/2014, 11:49 AM

## 2014-11-17 NOTE — Progress Notes (Signed)
This encounter was created in error - please disregard.

## 2014-11-28 ENCOUNTER — Ambulatory Visit (INDEPENDENT_AMBULATORY_CARE_PROVIDER_SITE_OTHER): Payer: Medicaid Other | Admitting: Family Medicine

## 2014-11-28 ENCOUNTER — Ambulatory Visit: Payer: Medicaid Other | Admitting: Family Medicine

## 2014-11-28 ENCOUNTER — Telehealth: Payer: Self-pay | Admitting: Family Medicine

## 2014-11-28 NOTE — Telephone Encounter (Signed)
Patient was >15 minutes late for appt today. She was not seen last week as she was late. At that time, front office staff conferred to her that she would not be seen if she was late (she was told this by myself at her previous appt when she arrived >20 minutes late but was still seen).   Patient concerned about what to do about other medications. Did not wish to stay to discuss that (but not narcotic refills).  She asked the MD to call. Attempted call. No answer. Did not leave a VM.   Archie Patten, MD [facility] Family Medicine Resident  11/28/2014, 5:16 PM

## 2014-11-29 ENCOUNTER — Telehealth: Payer: Self-pay | Admitting: Family Medicine

## 2014-11-29 NOTE — Telephone Encounter (Signed)
Attempted to call patient again. No answer. Left message to call [facility]. From reviewing medications, she should not be due for refill on Prozac until 08/30/15, a refill on Lamictal until 01/14/15, or a refill on Qvar until 07/18/15. If she returns call, please ask patient what medications she was concerned about having filled.  Thanks, Archie Patten, MD [facility] Medicine Resident  11/29/2014, 11:22 AM

## 2014-11-29 NOTE — Telephone Encounter (Signed)
Attempted to contact patient concerning her missed appt and concerns for her other medications. No answer. Will try back again later. If pt calls, please obtain a better contact number.  Thanks, Archie Patten, MD [facility] Family Medicine Resident  11/29/2014, 9:26 AM

## 2014-11-30 ENCOUNTER — Telehealth: Payer: Self-pay | Admitting: Family Medicine

## 2014-11-30 NOTE — Telephone Encounter (Signed)
Attempted to call again. No answer.   Thanks, Archie Patten, MD [facility] Family Medicine Resident  11/30/2014, 1:35 PM

## 2014-12-29 ENCOUNTER — Other Ambulatory Visit: Payer: Self-pay | Admitting: Family Medicine

## 2014-12-29 DIAGNOSIS — F3162 Bipolar disorder, current episode mixed, moderate: Secondary | ICD-10-CM

## 2014-12-29 MED ORDER — LAMOTRIGINE 100 MG PO TABS: 200.0000 mg | ORAL_TABLET | Freq: Every day | ORAL | Status: DC

## 2014-12-29 NOTE — Telephone Encounter (Signed)
Refilled Flexeril and Lamictal.   Thanks, Archie Patten, MD [facility] Family Medicine Resident  12/29/2014, 5:30 PM

## 2015-03-24 ENCOUNTER — Other Ambulatory Visit: Payer: Self-pay | Admitting: Family Medicine

## 2015-03-27 NOTE — Telephone Encounter (Signed)
Left message on patient voicemail informing of below.

## 2015-03-27 NOTE — Telephone Encounter (Signed)
Rx for Flexeril refilled. Patient needs to make a f/u appt, will not refill Flexeril again without patient being seen as it has been 5 months since she's been seen in clinic.  Thanks, Archie Patten, MD [facility] Family Medicine Resident  03/27/2015, 9:45 AM

## 2015-04-12 ENCOUNTER — Encounter: Payer: Self-pay | Admitting: Family Medicine

## 2015-04-12 ENCOUNTER — Ambulatory Visit (INDEPENDENT_AMBULATORY_CARE_PROVIDER_SITE_OTHER): Payer: Medicaid Other | Admitting: Family Medicine

## 2015-04-12 VITALS — BP 118/63 | HR 92 | Temp 98.3°F | Wt 188.3 lb

## 2015-04-12 DIAGNOSIS — Q6589 Other specified congenital deformities of hip: Secondary | ICD-10-CM

## 2015-04-12 DIAGNOSIS — F3162 Bipolar disorder, current episode mixed, moderate: Secondary | ICD-10-CM

## 2015-04-12 DIAGNOSIS — M25551 Pain in right hip: Secondary | ICD-10-CM

## 2015-04-12 MED ORDER — HYDROCODONE-ACETAMINOPHEN 5-325 MG PO TABS: 1.0000 | ORAL_TABLET | Freq: Four times a day (QID) | ORAL | Status: DC | PRN

## 2015-04-12 MED ORDER — FLUOXETINE HCL 40 MG PO CAPS: 40.0000 mg | ORAL_CAPSULE | Freq: Every day | ORAL | Status: DC

## 2015-04-12 MED ORDER — ALBUTEROL SULFATE HFA 108 (90 BASE) MCG/ACT IN AERS: 2.0000 | INHALATION_SPRAY | Freq: Four times a day (QID) | RESPIRATORY_TRACT | Status: DC | PRN

## 2015-04-12 MED ORDER — CYCLOBENZAPRINE HCL 10 MG PO TABS
10.0000 mg | ORAL_TABLET | Freq: Three times a day (TID) | ORAL | Status: DC | PRN
Start: 2015-04-12 — End: 2015-07-27

## 2015-04-12 MED ORDER — LAMOTRIGINE 100 MG PO TABS: 200.0000 mg | ORAL_TABLET | Freq: Every day | ORAL | Status: DC

## 2015-04-12 NOTE — Assessment & Plan Note (Signed)
Patient's symptoms stable from last visit. Now ready for referral back to ortho. I discussed that if she continues to miss her appts with ortho when made, they may not opt to see her. She seems serious about seeing Dr. Ambrose Finland again and undergoing a hip replacement. - Rx for Vicodin # 90 with 1 refill to get her through 2/12.  - Advised patient to make a f/u appt with me for 2 months and discussed making this appt early. -Rx for Flexeril - Referral back to ortho

## 2015-04-12 NOTE — Patient Instructions (Signed)
It was good to see you again. The ortho office should contact you with an appointment. Follow up with me in 2 months (you may want to call in 1 month to make sure you can get in).

## 2015-04-12 NOTE — Progress Notes (Signed)
Patient ID: Stacey Wong, female   DOB: 04/28/1981, 34 y.o.   MRN: [id number]   Subjective: CC: hip/leg pain  HPI: Patient is a 34 y.o. female with a past medical history of right congential hip dysplasia presenting to clinic today for mediation refill. Unfortunately, she has not followed up with me since June 2016.   The pain is sharp and most severe in the R hip and R lumbrosacral region with constant aching pain in her R thigh, knee and calf. She notes new "aching in her her bones" in the right leg that is new since her last visit.  Right knee is more painful now.  L hip is more severe. Lower back pain is stable. She has not contacted her orthopedist but is interested in doing this now. She has also gained >20lbs in the last 7 months. She notes its hard for her to exercise due to the pain and dietary indiscretion.   Social History: patient continues to smoke  Health maintenance: declines flu vaccine and TDaP today.  ROS: All other systems reviewed and are negative.  Past Medical History Patient Active Problem List   Diagnosis Date Noted  . Encounter for chronic pain management 06/16/2014  . Healthcare maintenance 11/10/2013  . Ganglion cyst 06/16/2013  . Numbness and tingling in hands 01/16/2013  . 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 HTN (hypertension) 06/20/2011  . Right hip pain 05/27/2011  . Back pain 05/09/2011  . Panic disorder 09/13/2010  . History of alcohol abuse 05/16/2010  . Asthma 05/03/2010  . GERD 05/03/2010  . SHOULDER PAIN, RIGHT, CHRONIC 04/10/2010  . Tobacco abuse counseling 01/14/2009  . Bipolar disorder ([place]) 05/11/2007  . AMENORRHEA 05/11/2007    Medications- reviewed and updated Current Outpatient Prescriptions  Medication Sig Dispense Refill  . albuterol (PROVENTIL HFA;VENTOLIN HFA) 108 (90 BASE) MCG/ACT inhaler Inhale 2 puffs into the lungs every 6 (six) hours as needed. For shortness of breath. 1 Inhaler 3  . beclomethasone (QVAR) 80 MCG/ACT inhaler  Inhale 1 puff into the lungs 2 (two) times daily. 1 Inhaler 12  . cyclobenzaprine (FLEXERIL) 10 MG tablet Take 1 tablet (10 mg total) by mouth 3 (three) times daily as needed for muscle spasms. 60 tablet 2  . FLUoxetine (PROZAC) 40 MG capsule Take 1 capsule (40 mg total) by mouth daily. 90 capsule 1  . HYDROcodone-acetaminophen (NORCO/VICODIN) 5-325 MG tablet Take 1 tablet by mouth every 6 (six) hours as needed for moderate pain. 90 tablet 0  . ibuprofen (ADVIL,MOTRIN) 800 MG tablet Take 1 tablet (800 mg total) by mouth every 8 (eight) hours as needed for mild pain or moderate pain. 15 tablet 0  . lamoTRIgine (LAMICTAL) 100 MG tablet Take 2 tablets (200 mg total) by mouth daily. 60 tablet 2  . meloxicam (MOBIC) 15 MG tablet Take 1 tablet (15 mg total) by mouth daily. (Patient not taking: Reported on 08/02/2014) 30 tablet 0  . Olive Oil (SWEET OIL) OIL Place 1 drop into both ears daily.     No current facility-administered medications for this visit.    Objective: Office vital signs reviewed. BP 118/63 mmHg  Pulse 92  Temp(Src) 98.3 F (36.8 C) (Oral)  Wt 188 lb 4.8 oz (85.412 kg)  LMP 03/14/2015 (Approximate)   Physical Examination:  General: Sitting up in NAD.  Extremities: Normal gait and station. No tenderness over the spinal processes or paraspinal muscles. No tenderness over the left hip. Moderate tenderness to palpation over the right hip. Full ROM at  the hips. 5/5 hip flexion/extension bilaterally. 4/5 strength in knee flexion/extension on the R, 5/5 on the L.  Visible leg length discrepancy (R shorter than L).  Neuro: Sensation grossly intact, patellar DTRs 2/4. Negative SLR.  Assessment/Plan: Right hip pain Patient's symptoms stable from last visit. Now ready for referral back to ortho. I discussed that if she continues to miss her appts with ortho when made, they may not opt to see her. She seems serious about seeing Dr. Ambrose Finland again and undergoing a hip replacement. - Rx for  Vicodin # 90 with 1 refill to get her through 2/12.  - Advised patient to make a f/u appt with me for 2 months and discussed making this appt early. -Rx for Flexeril - Referral back to ortho    Orders Placed This Encounter  Procedures  . Ambulatory referral to Orthopedic Surgery    Referral Priority:  Routine    Referral Type:  Surgical    Referral Reason:  Specialty Services Required    Requested Specialty:  Orthopedic Surgery    Number of Visits Requested:  1    Meds ordered this encounter  Medications  . albuterol (PROVENTIL HFA;VENTOLIN HFA) 108 (90 BASE) MCG/ACT inhaler    Sig: Inhale 2 puffs into the lungs every 6 (six) hours as needed. For shortness of breath.    Dispense:  1 Inhaler    Refill:  3  . lamoTRIgine (LAMICTAL) 100 MG tablet    Sig: Take 2 tablets (200 mg total) by mouth daily.    Dispense:  60 tablet    Refill:  2  . cyclobenzaprine (FLEXERIL) 10 MG tablet    Sig: Take 1 tablet (10 mg total) by mouth 3 (three) times daily as needed for muscle spasms.    Dispense:  60 tablet    Refill:  2  . DISCONTD: HYDROcodone-acetaminophen (NORCO/VICODIN) 5-325 MG tablet    Sig: Take 1 tablet by mouth every 6 (six) hours as needed for moderate pain.    Dispense:  90 tablet    Refill:  0  . HYDROcodone-acetaminophen (NORCO/VICODIN) 5-325 MG tablet    Sig: Take 1 tablet by mouth every 6 (six) hours as needed for moderate pain.    Dispense:  90 tablet    Refill:  0    Do no fill until 05/09/14  . FLUoxetine (PROZAC) 40 MG capsule    Sig: Take 1 capsule (40 mg total) by mouth daily.    Dispense:  90 capsule    Refill:  [facility] PGY-1, [place]

## 2015-05-26 ENCOUNTER — Ambulatory Visit (INDEPENDENT_AMBULATORY_CARE_PROVIDER_SITE_OTHER): Payer: Medicaid Other | Admitting: Family Medicine

## 2015-05-26 ENCOUNTER — Encounter: Payer: Self-pay | Admitting: Family Medicine

## 2015-05-26 VITALS — BP 143/73 | HR 101 | Temp 98.5°F | Ht 64.0 in | Wt 192.0 lb

## 2015-05-26 DIAGNOSIS — Z23 Encounter for immunization: Secondary | ICD-10-CM

## 2015-05-26 DIAGNOSIS — F3171 Bipolar disorder, in partial remission, most recent episode hypomanic: Secondary | ICD-10-CM | POA: Diagnosis not present

## 2015-05-26 DIAGNOSIS — M7062 Trochanteric bursitis, left hip: Secondary | ICD-10-CM

## 2015-05-26 DIAGNOSIS — M25551 Pain in right hip: Secondary | ICD-10-CM

## 2015-05-26 MED ORDER — MELOXICAM 15 MG PO TABS: 15.0000 mg | ORAL_TABLET | Freq: Every day | ORAL | Status: DC

## 2015-05-26 MED ORDER — GUAIFENESIN ER 600 MG PO TB12: 600.0000 mg | ORAL_TABLET | Freq: Two times a day (BID) | ORAL | Status: DC

## 2015-05-26 MED ORDER — HYDROCODONE-ACETAMINOPHEN 5-325 MG PO TABS: 1.0000 | ORAL_TABLET | Freq: Four times a day (QID) | ORAL | Status: DC | PRN

## 2015-05-26 NOTE — Patient Instructions (Addendum)
Call Dr. Florentina Addison at [phone] to schedule an appointment. At the front desk ask them to make an appointment with Stacey Wong (behavioral health) Follow up with me mid to late March or sooner as needed if your left hip pain does not improve.

## 2015-05-26 NOTE — Progress Notes (Signed)
Patient ID: Stacey Wong, female   DOB: 01-12-1981, 35 y.o.   MRN: [id number]   Subjective: CC: hip/leg pain  HPI: Patient is a 35 y.o. female with a past medical history of right congential hip dysplasia presenting to clinic today for mediation refill.   Patient noted L hip/buttocks burning sensation that's constant for the last several weeks. Some worsening of pain with movement. Pain seems to stay in one location. Does not recall any injury.  T  She continues to have sharp pain, most severe in the R hip and R lumbrosacral region with constant aching pain in her R thigh, knee and calf. She continues to have aching in her her bones and is now having shooting pain down the R leg. The orthopedist attempted to contact her for an appt, she stated "I know." She endorses she was "scared" to make an appt with them.   Bipolar disorder: Stable. Sleeping varies. Average mood is sullen. Eating ok, maybe overeating. Finds joy in her daughter. No SI or HI. Does not want to speak with integrated care today but would like to make an appt with them at another time.   Social History: patient continues to smoke  Health maintenance: declines flu vaccine and TDaP today.  ROS: All other systems reviewed and are negative except that noted in HPI  Past Medical History Patient Active Problem List   Diagnosis Date Noted  . Trochanteric bursitis 05/27/2015  . Encounter for chronic pain management 06/16/2014  . Healthcare maintenance 11/10/2013  . Ganglion cyst 06/16/2013  . Numbness and tingling in hands 01/16/2013  . 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 HTN (hypertension) 06/20/2011  . Right hip pain 05/27/2011  . Back pain 05/09/2011  . Panic disorder 09/13/2010  . History of alcohol abuse 05/16/2010  . Asthma 05/03/2010  . GERD 05/03/2010  . SHOULDER PAIN, RIGHT, CHRONIC 04/10/2010  . Tobacco abuse counseling 01/14/2009  . Bipolar disorder ([place]) 05/11/2007  . AMENORRHEA 05/11/2007    Medications-  reviewed and updated Current Outpatient Prescriptions  Medication Sig Dispense Refill  . albuterol (PROVENTIL HFA;VENTOLIN HFA) 108 (90 BASE) MCG/ACT inhaler Inhale 2 puffs into the lungs every 6 (six) hours as needed. For shortness of breath. 1 Inhaler 3  . beclomethasone (QVAR) 80 MCG/ACT inhaler Inhale 1 puff into the lungs 2 (two) times daily. 1 Inhaler 12  . cyclobenzaprine (FLEXERIL) 10 MG tablet Take 1 tablet (10 mg total) by mouth 3 (three) times daily as needed for muscle spasms. 60 tablet 2  . FLUoxetine (PROZAC) 40 MG capsule Take 1 capsule (40 mg total) by mouth daily. 90 capsule 1  . guaiFENesin (MUCINEX) 600 MG 12 hr tablet Take 1 tablet (600 mg total) by mouth 2 (two) times daily. 30 tablet 0  . HYDROcodone-acetaminophen (NORCO/VICODIN) 5-325 MG tablet Take 1 tablet by mouth every 6 (six) hours as needed for moderate pain. 90 tablet 0  . lamoTRIgine (LAMICTAL) 100 MG tablet Take 2 tablets (200 mg total) by mouth daily. 60 tablet 2  . meloxicam (MOBIC) 15 MG tablet Take 1 tablet (15 mg total) by mouth daily. 30 tablet 0  . Olive Oil (SWEET OIL) OIL Place 1 drop into both ears daily.     No current facility-administered medications for this visit.    Objective: Office vital signs reviewed. BP 143/73 mmHg  Pulse 101  Temp(Src) 98.5 F (36.9 C) (Oral)  Ht 5\' 4"  (1.626 m)  Wt 192 lb (87.091 kg)  BMI 32.94 kg/m2  LMP 05/19/2015  Physical Examination:  General: Sitting up in NAD.   CV: RRR, no m/r/g noted. Pulm: CTAB without wheezing, rhonchi, or crackles noted.  MSK: Normal gait and station. No tenderness over the spinal processes or paraspinal muscles. Significant tenderness to palpation over the L trochanter that exacerbated by hip abduction.  Moderate tenderness to palpation over the right hip. Full ROM at the hips. 4/5 hip flexion/extension bilaterally. 4/5 strength in knee flexion/extension bilaterally.  Visible leg length discrepancy (R shorter than L).  Neuro:  Sensation grossly intact in the LEs, patellar DTRs 2/4. Negative SLR.  Assessment/Plan: Bipolar disorder Stable per the patient. Not too eager to discuss symptoms with me today. Declined meeting with integrated care in clinic today but notes she would be okay with making a separate appointment with him.  Patient has used her psychiatric instability in the past to discuss postponing her hip surgery. We've dicussed that life will never be "ideal" for surgery, however it is better to have this done now prior to any more damage being done.  - continue current regimen  - patient to make an appt with Elton Sin with integrated care.   Right hip pain Patient's symptoms stable from last visit with no red flags on exam. Provided her the number for Dr. Caralyn Guile and stated that having an appointment with him did not lock her into undergoing surgery soon. Patient voiced understanding.  - Rx for Vicodin # 90 with 1 refill to get her through 07/29/15  - Advised patient to make a f/u appt with me for 2 months and discussed making this appt early. - Rx for Flexeril - a next visit will get a repeat UDS and discuss taping down medication in preparation for eventual surgery (so post-op pain is not too difficult to control).   Trochanteric bursitis Symptoms and exam consistent with trochanteric bursitis. No h/o trauma. No swelling, redness, or warmth concerning for infection.  - Rx for Mobic for 1 week scheduled, then as needed after that. - Patient to f/u as needed.     Orders Placed This Encounter  Procedures  . Tdap vaccine greater than or equal to 7yo IM    Meds ordered this encounter  Medications  . DISCONTD: HYDROcodone-acetaminophen (NORCO/VICODIN) 5-325 MG tablet    Sig: Take 1 tablet by mouth every 6 (six) hours as needed for moderate pain.    Dispense:  90 tablet    Refill:  0    Do no fill until 06/04/15  . HYDROcodone-acetaminophen (NORCO/VICODIN) 5-325 MG tablet    Sig: Take 1 tablet by  mouth every 6 (six) hours as needed for moderate pain.    Dispense:  90 tablet    Refill:  0    Do no fill until 06/28/15  . guaiFENesin (MUCINEX) 600 MG 12 hr tablet    Sig: Take 1 tablet (600 mg total) by mouth 2 (two) times daily.    Dispense:  30 tablet    Refill:  0  . meloxicam (MOBIC) 15 MG tablet    Sig: Take 1 tablet (15 mg total) by mouth daily.    Dispense:  30 tablet    Refill:  0    Archie Patten, MD PGY-2, [place]

## 2015-05-27 DIAGNOSIS — M7060 Trochanteric bursitis, unspecified hip: Secondary | ICD-10-CM | POA: Insufficient documentation

## 2015-05-27 NOTE — Assessment & Plan Note (Addendum)
Stable per the patient. Not too eager to discuss symptoms with me today. Declined meeting with integrated care in clinic today but notes she would be okay with making a separate appointment with him.  Patient has used her psychiatric instability in the past to discuss postponing her hip surgery. We've dicussed that life will never be "ideal" for surgery, however it is better to have this done now prior to any more damage being done.  - continue current regimen  - patient to make an appt with Elton Sin with integrated care.

## 2015-05-27 NOTE — Assessment & Plan Note (Signed)
Symptoms and exam consistent with trochanteric bursitis. No h/o trauma. No swelling, redness, or warmth concerning for infection.  - Rx for Mobic for 1 week scheduled, then as needed after that. - Patient to f/u as needed.

## 2015-05-27 NOTE — Assessment & Plan Note (Signed)
Patient's symptoms stable from last visit with no red flags on exam. Provided her the number for Dr. Caralyn Guile and stated that having an appointment with him did not lock her into undergoing surgery soon. Patient voiced understanding.  - Rx for Vicodin # 90 with 1 refill to get her through 07/29/15  - Advised patient to make a f/u appt with me for 2 months and discussed making this appt early. - Rx for Flexeril - a next visit will get a repeat UDS and discuss taping down medication in preparation for eventual surgery (so post-op pain is not too difficult to control).

## 2015-06-23 ENCOUNTER — Ambulatory Visit: Payer: Medicaid Other

## 2015-06-26 ENCOUNTER — Encounter: Payer: Self-pay | Admitting: *Deleted

## 2015-06-26 ENCOUNTER — Emergency Department
Admission: EM | Admit: 2015-06-26 | Discharge: 2015-06-26 | Disposition: A | Discharge status: Home / Self Care | Payer: Medicaid Other | Attending: Student | Admitting: Student

## 2015-06-26 DIAGNOSIS — F17210 Nicotine dependence, cigarettes, uncomplicated: Secondary | ICD-10-CM | POA: Diagnosis not present

## 2015-06-26 DIAGNOSIS — Z7951 Long term (current) use of inhaled steroids: Secondary | ICD-10-CM | POA: Diagnosis not present

## 2015-06-26 DIAGNOSIS — Z79899 Other long term (current) drug therapy: Secondary | ICD-10-CM | POA: Insufficient documentation

## 2015-06-26 DIAGNOSIS — K047 Periapical abscess without sinus: Secondary | ICD-10-CM | POA: Diagnosis not present

## 2015-06-26 DIAGNOSIS — Z791 Long term (current) use of non-steroidal anti-inflammatories (NSAID): Secondary | ICD-10-CM | POA: Diagnosis not present

## 2015-06-26 DIAGNOSIS — I10 Essential (primary) hypertension: Secondary | ICD-10-CM | POA: Diagnosis not present

## 2015-06-26 DIAGNOSIS — K029 Dental caries, unspecified: Secondary | ICD-10-CM

## 2015-06-26 DIAGNOSIS — R220 Localized swelling, mass and lump, head: Secondary | ICD-10-CM | POA: Diagnosis not present

## 2015-06-26 DIAGNOSIS — K0889 Other specified disorders of teeth and supporting structures: Secondary | ICD-10-CM | POA: Diagnosis present

## 2015-06-26 MED ORDER — PENICILLIN V POTASSIUM 500 MG PO TABS: 500.0000 mg | ORAL_TABLET | Freq: Four times a day (QID) | ORAL | Status: DC

## 2015-06-26 MED ORDER — ACETAMINOPHEN-CODEINE #3 300-30 MG PO TABS
ORAL_TABLET | ORAL | Status: AC
  Administered 2015-06-26: 1 via ORAL
  Filled 2015-06-26: qty 1

## 2015-06-26 MED ORDER — ACETAMINOPHEN-CODEINE #3 300-30 MG PO TABS: 1.0000 | ORAL_TABLET | Freq: Four times a day (QID) | ORAL | Status: DC | PRN

## 2015-06-26 MED ORDER — ACETAMINOPHEN-CODEINE #3 300-30 MG PO TABS
1.0000 | ORAL_TABLET | Freq: Once | ORAL | Status: AC
  Administered 2015-06-26: 1 via ORAL

## 2015-06-26 MED ORDER — PENICILLIN V POTASSIUM 500 MG PO TABS
500.0000 mg | ORAL_TABLET | Freq: Once | ORAL | Status: AC
  Administered 2015-06-26: 500 mg via ORAL
  Filled 2015-06-26: qty 1

## 2015-06-26 NOTE — ED Notes (Signed)
States she had some dental pain yesterday  And woke up with swelling to right side of face.

## 2015-06-26 NOTE — ED Notes (Signed)
States dental pain yesterday and woke up with right sided facial swelling and pain

## 2015-06-26 NOTE — ED Provider Notes (Signed)
[facility] Emergency Department Provider Note ____________________________________________  Time seen: 1710  I have reviewed the triage vital signs and the nursing notes.  HISTORY  Chief Complaint  Dental Pain   HPI Stacey Wong is a 35 y.o. female presents to the ED for evaluation of sudden onset of right facial pain and swelling. Patient describes some tenderness to the nasolabial fold yesterday, and by the time she woke this morning she noticed significant swelling to the same area of her face. She also endorses dental pain since yesterday. She denies any dental trauma, known cavity, or or purulent discharge into the mouth. She also denies any significant fevers, chills, sweats. She reports to have a dental appointment scheduled for 1 week for an unrelated issue with her provider.Patient describes the pain to her mouth a 10/10 in triage.  Past Medical History  Diagnosis Date  . Asthma   . Hypertension   . Bipolar 1 disorder [facility])     Patient Active Problem List   Diagnosis Date Noted  . Trochanteric bursitis 05/27/2015  . Encounter for chronic pain management 06/16/2014  . Healthcare maintenance 11/10/2013  . Ganglion cyst 06/16/2013  . Numbness and tingling in hands 01/16/2013  . 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 HTN (hypertension) 06/20/2011  . Right hip pain 05/27/2011  . Back pain 05/09/2011  . Panic disorder 09/13/2010  . History of alcohol abuse 05/16/2010  . Asthma 05/03/2010  . GERD 05/03/2010  . SHOULDER PAIN, RIGHT, CHRONIC 04/10/2010  . Tobacco abuse counseling 01/14/2009  . Bipolar disorder ([place]) 05/11/2007  . AMENORRHEA 05/11/2007    Past Surgical History  Procedure Laterality Date  . Cholecystectomy      Current Outpatient Rx  Name  Route  Sig  Dispense  Refill  . acetaminophen-codeine (TYLENOL #3) 300-30 MG tablet   Oral   Take 1 tablet by mouth every 6 (six) hours as needed for moderate pain.   10 tablet   0   .  albuterol (PROVENTIL HFA;VENTOLIN HFA) 108 (90 BASE) MCG/ACT inhaler   Inhalation   Inhale 2 puffs into the lungs every 6 (six) hours as needed. For shortness of breath.   1 Inhaler   3   . beclomethasone (QVAR) 80 MCG/ACT inhaler   Inhalation   Inhale 1 puff into the lungs 2 (two) times daily.   1 Inhaler   12   . cyclobenzaprine (FLEXERIL) 10 MG tablet   Oral   Take 1 tablet (10 mg total) by mouth 3 (three) times daily as needed for muscle spasms.   60 tablet   2   . FLUoxetine (PROZAC) 40 MG capsule   Oral   Take 1 capsule (40 mg total) by mouth daily.   90 capsule   1   . guaiFENesin (MUCINEX) 600 MG 12 hr tablet   Oral   Take 1 tablet (600 mg total) by mouth 2 (two) times daily.   30 tablet   0   . HYDROcodone-acetaminophen (NORCO/VICODIN) 5-325 MG tablet   Oral   Take 1 tablet by mouth every 6 (six) hours as needed for moderate pain.   90 tablet   0     Do no fill until 06/28/15   . lamoTRIgine (LAMICTAL) 100 MG tablet   Oral   Take 2 tablets (200 mg total) by mouth daily.   60 tablet   2   . meloxicam (MOBIC) 15 MG tablet   Oral   Take 1 tablet (15 mg total)  by mouth daily.   30 tablet   0   . Olive Oil (SWEET OIL) OIL   Both Ears   Place 1 drop into both ears daily.         . penicillin v potassium (VEETID) 500 MG tablet   Oral   Take 1 tablet (500 mg total) by mouth 4 (four) times daily.   40 tablet   0    Allergies Review of patient's allergies indicates no known allergies.  History reviewed. No pertinent family history.  Social History Social History  Substance Use Topics  . Smoking status: Current Every Day Smoker -- 1.00 packs/day    Types: Cigarettes  . Smokeless tobacco: None     Comment: decreased smoking  . Alcohol Use: 0.0 oz/week    0 Standard drinks or equivalent per week     Comment: rare   Review of Systems  Constitutional: Negative for fever. Eyes: Negative for visual changes. ENT: Negative for sore throat.  Dental pain and facial swelling Cardiovascular: Negative for chest pain. Respiratory: Negative for shortness of breath. Gastrointestinal: Negative for abdominal pain, vomiting and diarrhea. Genitourinary: Negative for dysuria. Musculoskeletal: Negative for back pain. Skin: Negative for rash. Neurological: Negative for headaches, focal weakness or numbness. ____________________________________________  PHYSICAL EXAM:  VITAL SIGNS: ED Triage Vitals  Enc Vitals Group     BP 06/26/15 1611 136/82 mmHg     Pulse Rate 06/26/15 1611 92     Resp 06/26/15 1611 22     Temp 06/26/15 1611 98.6 F (37 C)     Temp Source 06/26/15 1611 Oral     SpO2 06/26/15 1611 95 %     Weight 06/26/15 1611 180 lb (81.647 kg)     Height 06/26/15 1611 5\' 4"  (1.626 m)     Head Cir --      Peak Flow --      Pain Score 06/26/15 1615 10     Pain Loc --      Pain Edu? --      Excl. in [place]? --    Constitutional: Alert and oriented. Well appearing and in no distress. Head: Normocephalic and atraumatic. Moderate swelling to the right nasolabial fold.       Eyes: Conjunctivae are normal. PERRL. Normal extraocular movements      Ears: Canals clear. TMs intact bilaterally.   Nose: No congestion/rhinorrhea.   Mouth/Throat: Mucous membranes are moist. No appreciable dental fracture or cavity. No gumline swelling or fluctuance noted.    Neck: Supple. No thyromegaly. Hematological/Lymphatic/Immunological: No cervical lymphadenopathy. Cardiovascular: Normal rate, regular rhythm.  Respiratory: Normal respiratory effort. No wheezes/rales/rhonchi. Musculoskeletal: Nontender with normal range of motion in all extremities.  Neurologic:  Normal gait without ataxia. Normal speech and language. No gross focal neurologic deficits are appreciated. Skin:  Skin is warm, dry and intact. No rash noted. ____________________________________________  PROCEDURES  Tylenol #3 PO Pen VK 500 mg  PO ____________________________________________  INITIAL IMPRESSION / ASSESSMENT AND PLAN / ED COURSE  Patient with acute dental pain and facial swelling secondary to dental abscess. She will be discharged with a perception for Pen-Vee K and Tylenol with codeine to dose as directed. She will follow with the primary dental provider in one week as scheduled. Return precautions are reviewed. ____________________________________________  FINAL CLINICAL IMPRESSION(S) / ED DIAGNOSES  Final diagnoses:  Dental abscess  Pain due to dental caries      Melvenia Needles, PA-C 06/26/15 1752  Joanne Gavel, MD 06/26/15  1927 

## 2015-06-26 NOTE — Discharge Instructions (Signed)
Dental Abscess A dental abscess is a collection of pus in or around a tooth. CAUSES This condition is caused by a bacterial infection around the root of the tooth that involves the inner part of the tooth (pulp). It may result from:  Severe tooth decay.  Trauma to the tooth that allows bacteria to enter into the pulp, such as a broken or chipped tooth.  Severe gum disease around a tooth. SYMPTOMS Symptoms of this condition include:  Severe pain in and around the infected tooth.  Swelling and redness around the infected tooth, in the mouth, or in the face.  Tenderness.  Pus drainage.  Bad breath.  Bitter taste in the mouth.  Difficulty swallowing.  Difficulty opening the mouth.  Nausea.  Vomiting.  Chills.  Swollen neck glands.  Fever. DIAGNOSIS This condition is diagnosed with examination of the infected tooth. During the exam, your dentist may tap on the infected tooth. Your dentist will also ask about your medical and dental history and may order X-rays. TREATMENT This condition is treated by eliminating the infection. This may be done with:  Antibiotic medicine.  A root canal. This may be performed to save the tooth.  Pulling (extracting) the tooth. This may also involve draining the abscess. This is done if the tooth cannot be saved. HOME CARE INSTRUCTIONS  Take medicines only as directed by your dentist.  If you were prescribed antibiotic medicine, finish all of it even if you start to feel better.  Rinse your mouth (gargle) often with salt water to relieve pain or swelling.  Do not drive or operate heavy machinery while taking pain medicine.  Do not apply heat to the outside of your mouth.  Keep all follow-up visits as directed by your dentist. This is important. SEEK MEDICAL CARE IF:  Your pain is worse and is not helped by medicine. SEEK IMMEDIATE MEDICAL CARE IF:  You have a fever or chills.  Your symptoms suddenly get worse.  You have a  very bad headache.  You have problems breathing or swallowing.  You have trouble opening your mouth.  You have swelling in your neck or around your eye.   This information is not intended to replace advice given to you by your health care provider. Make sure you discuss any questions you have with your health care provider.   Document Released: 04/15/2005 Document Revised: 08/30/2014 Document Reviewed: 04/12/2014 Elsevier Interactive Patient Education 2016 [place] the prescription antibiotic as directed until completed. Apply warm compresses to the face to reduce swelling. Return to the ED for worsening facial pain and swelling. Follow-up with your dental provider in 1 week, as scheduled.

## 2015-07-12 ENCOUNTER — Ambulatory Visit: Payer: Medicaid Other | Admitting: Family Medicine

## 2015-07-27 ENCOUNTER — Ambulatory Visit (INDEPENDENT_AMBULATORY_CARE_PROVIDER_SITE_OTHER): Payer: Medicaid Other | Admitting: Family Medicine

## 2015-07-27 ENCOUNTER — Encounter: Payer: Self-pay | Admitting: Family Medicine

## 2015-07-27 VITALS — BP 141/72 | HR 92 | Temp 98.1°F | Ht 64.0 in | Wt 183.0 lb

## 2015-07-27 DIAGNOSIS — F3171 Bipolar disorder, in partial remission, most recent episode hypomanic: Secondary | ICD-10-CM | POA: Diagnosis not present

## 2015-07-27 DIAGNOSIS — Z716 Tobacco abuse counseling: Secondary | ICD-10-CM | POA: Diagnosis not present

## 2015-07-27 DIAGNOSIS — M7062 Trochanteric bursitis, left hip: Secondary | ICD-10-CM

## 2015-07-27 DIAGNOSIS — M25551 Pain in right hip: Secondary | ICD-10-CM

## 2015-07-27 MED ORDER — CYCLOBENZAPRINE HCL 10 MG PO TABS: 10.0000 mg | ORAL_TABLET | Freq: Three times a day (TID) | ORAL | Status: DC | PRN

## 2015-07-27 MED ORDER — HYDROCODONE-ACETAMINOPHEN 5-325 MG PO TABS: 1.0000 | ORAL_TABLET | Freq: Four times a day (QID) | ORAL | Status: DC | PRN

## 2015-07-27 NOTE — Progress Notes (Signed)
Patient ID: Stacey Wong, female   DOB: [birth year], 35 y.o.   MRN: [id number]   Subjective: CC: hip/leg pain  HPI: Patient is a 35 y.o. female with a past medical history of right congential hip dysplasia presenting to clinic today for mediation refill.   She's currently taking Norco TID (this is a recent decrease)- it eases the pain from a 10/10 down to a 6-7/10. She feels grinding and crunching of that R hip with movement with occasional "pop" if she moves a certain way. She feels like her R hip sometimes gets stuck.  All of this is at her baseline.  She has some pain in the L hop but its improved. She has pain in her knees bilaterally that "ache." She continues to have shooting pain from the hips down bilaterally.   Trochanteric bursitis: The L hip/buttocks burning sensation she previously had resolved with Mobic.    Bipolar disorder: Stable. Sleeping varies. Average mood is "ok". Still possibly overeating. Finds joy in her daughter. No SI or HI. IC is not available today and she failed to make an appt at her last visit.    Social History: patient continues to smoke: doesn't wish to quit.   Health maintenance: declines flu vaccine and TDaP today.  ROS: All other systems reviewed and are negative except that noted in HPI  Past Medical History Patient Active Problem List   Diagnosis Date Noted  . Trochanteric bursitis 05/27/2015  . Encounter for chronic pain management 06/16/2014  . Healthcare maintenance 11/10/2013  . Ganglion cyst 06/16/2013  . Numbness and tingling in hands 01/16/2013  . 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 HTN (hypertension) 06/20/2011  . Right hip pain 05/27/2011  . Back pain 05/09/2011  . Panic disorder 09/13/2010  . History of alcohol abuse 05/16/2010  . Asthma 05/03/2010  . GERD 05/03/2010  . SHOULDER PAIN, RIGHT, CHRONIC 04/10/2010  . Tobacco abuse counseling 01/14/2009  . Bipolar disorder ([place]) 05/11/2007  . AMENORRHEA 05/11/2007    Medications-  reviewed and updated Current Outpatient Prescriptions  Medication Sig Dispense Refill  . albuterol (PROVENTIL HFA;VENTOLIN HFA) 108 (90 BASE) MCG/ACT inhaler Inhale 2 puffs into the lungs every 6 (six) hours as needed. For shortness of breath. 1 Inhaler 3  . beclomethasone (QVAR) 80 MCG/ACT inhaler Inhale 1 puff into the lungs 2 (two) times daily. 1 Inhaler 12  . cyclobenzaprine (FLEXERIL) 10 MG tablet Take 1 tablet (10 mg total) by mouth 3 (three) times daily as needed for muscle spasms. 60 tablet 2  . FLUoxetine (PROZAC) 40 MG capsule Take 1 capsule (40 mg total) by mouth daily. 90 capsule 1  . HYDROcodone-acetaminophen (NORCO/VICODIN) 5-325 MG tablet Take 1 tablet by mouth every 6 (six) hours as needed for moderate pain. 90 tablet 0  . lamoTRIgine (LAMICTAL) 100 MG tablet Take 2 tablets (200 mg total) by mouth daily. 60 tablet 2   No current facility-administered medications for this visit.    Objective: Office vital signs reviewed. BP 141/72 mmHg  Pulse 92  Temp(Src) 98.1 F (36.7 C) (Oral)  Ht 5\' 4"  (1.626 m)  Wt 183 lb (83.008 kg)  BMI 31.40 kg/m2  LMP 07/24/2015   Physical Examination:  General: Sitting up in NAD.   CV: RRR, no m/r/g noted. Pulm: CTAB without wheezing, rhonchi, or crackles noted.  MSK: Normal gait and station. No tenderness over the spinal processes or paraspinal muscles. Significant tenderness to palpation over the L trochanter that exacerbated by hip abduction.  Moderate tenderness to  palpation over the right hip. Full ROM at the hips. 4/5 hip flexion/extension bilaterally. 4/5 strength in knee flexion/extension bilaterally.  Visible leg length discrepancy (R shorter than L).  Neuro: Sensation grossly intact in the LEs, patellar DTRs 2/4. Negative SLR.  Assessment/Plan: Trochanteric bursitis Resolved.   Tobacco abuse counseling Discussed, patient not willing to try to quit at this time; she notes it was a big feat that she was able to stop drinking.    Bipolar disorder Patient never called our office to make an appt with Elton Sin [facility]) > she was unable to wait for him during a previous appt we had. States she feels her mood is stable. Not enthusiastic about talking with someone at this time.   Right hip pain Pain seems to be stable. We discussed that her homework before seeing me in 2 months is to call the orthopedist. She's had a R hip injection in the past, I wonder if this could be beneficial. We've started to titrate her medication down and she's seem to tolerate this okay. - Rx for viocdin #90 with 1 refill to get her through the end of May  - Rx for Flexeril - UDS at next appt, discussed this with the patient.      No orders of the defined types were placed in this encounter.    Meds ordered this encounter  Medications  . DISCONTD: HYDROcodone-acetaminophen (NORCO/VICODIN) 5-325 MG tablet    Sig: Take 1 tablet by mouth every 6 (six) hours as needed for moderate pain.    Dispense:  90 tablet    Refill:  0  . HYDROcodone-acetaminophen (NORCO/VICODIN) 5-325 MG tablet    Sig: Take 1 tablet by mouth every 6 (six) hours as needed for moderate pain.    Dispense:  90 tablet    Refill:  0    Do not fill until 08/23/15  . cyclobenzaprine (FLEXERIL) 10 MG tablet    Sig: Take 1 tablet (10 mg total) by mouth 3 (three) times daily as needed for muscle spasms.    Dispense:  60 tablet    Refill:  2    Archie Patten, MD PGY-2, [place]

## 2015-07-27 NOTE — Patient Instructions (Addendum)
Call Dr. Florentina Addison at [phone] to schedule an appointment- this is your homework before I see you in 2 months.  Elton Sin (behavioral health) will contact you.   Follow up with me in 2 months, or sooner as needed.

## 2015-07-28 NOTE — Assessment & Plan Note (Addendum)
Patient never called our office to make an appt with Elton Sin [facility]) > she was unable to wait for him during a previous appt we had. States she feels her mood is stable. Not enthusiastic about talking with someone at this time.

## 2015-07-28 NOTE — Assessment & Plan Note (Signed)
Pain seems to be stable. We discussed that her homework before seeing me in 2 months is to call the orthopedist. She's had a R hip injection in the past, I wonder if this could be beneficial. We've started to titrate her medication down and she's seem to tolerate this okay. - Rx for viocdin #90 with 1 refill to get her through the end of May  - Rx for Flexeril - UDS at next appt, discussed this with the patient.

## 2015-07-28 NOTE — Assessment & Plan Note (Signed)
Discussed, patient not willing to try to quit at this time; she notes it was a big feat that she was able to stop drinking.

## 2015-07-28 NOTE — Assessment & Plan Note (Signed)
Resolved

## 2016-10-27 ENCOUNTER — Emergency Department (HOSPITAL_COMMUNITY): Payer: Self-pay

## 2016-10-27 ENCOUNTER — Emergency Department (HOSPITAL_COMMUNITY)
Admission: EM | Admit: 2016-10-27 | Discharge: 2016-10-27 | Disposition: A | Discharge status: Home / Self Care | Payer: Self-pay | Attending: Emergency Medicine | Admitting: Emergency Medicine

## 2016-10-27 ENCOUNTER — Encounter (HOSPITAL_COMMUNITY): Payer: Self-pay | Admitting: *Deleted

## 2016-10-27 DIAGNOSIS — Z9049 Acquired absence of other specified parts of digestive tract: Secondary | ICD-10-CM | POA: Insufficient documentation

## 2016-10-27 DIAGNOSIS — F319 Bipolar disorder, unspecified: Secondary | ICD-10-CM | POA: Insufficient documentation

## 2016-10-27 DIAGNOSIS — F17210 Nicotine dependence, cigarettes, uncomplicated: Secondary | ICD-10-CM | POA: Insufficient documentation

## 2016-10-27 DIAGNOSIS — S8392XA Sprain of unspecified site of left knee, initial encounter: Secondary | ICD-10-CM | POA: Insufficient documentation

## 2016-10-27 DIAGNOSIS — J45909 Unspecified asthma, uncomplicated: Secondary | ICD-10-CM | POA: Insufficient documentation

## 2016-10-27 DIAGNOSIS — Y929 Unspecified place or not applicable: Secondary | ICD-10-CM | POA: Insufficient documentation

## 2016-10-27 DIAGNOSIS — Z79899 Other long term (current) drug therapy: Secondary | ICD-10-CM | POA: Insufficient documentation

## 2016-10-27 DIAGNOSIS — X509XXA Other and unspecified overexertion or strenuous movements or postures, initial encounter: Secondary | ICD-10-CM | POA: Insufficient documentation

## 2016-10-27 DIAGNOSIS — Y939 Activity, unspecified: Secondary | ICD-10-CM | POA: Insufficient documentation

## 2016-10-27 DIAGNOSIS — Y999 Unspecified external cause status: Secondary | ICD-10-CM | POA: Insufficient documentation

## 2016-10-27 DIAGNOSIS — I10 Essential (primary) hypertension: Secondary | ICD-10-CM | POA: Insufficient documentation

## 2016-10-27 MED ORDER — HYDROCODONE-ACETAMINOPHEN 5-325 MG PO TABS
1.0000 | ORAL_TABLET | Freq: Once | ORAL | Status: AC
  Administered 2016-10-27: 2 via ORAL
  Filled 2016-10-27: qty 2

## 2016-10-27 MED ORDER — HYDROCODONE-ACETAMINOPHEN 5-325 MG PO TABS: 2.0000 | ORAL_TABLET | Freq: Four times a day (QID) | ORAL | 0 refills | Status: DC | PRN

## 2016-10-27 NOTE — ED Notes (Signed)
Patient transported to X-ray 

## 2016-10-27 NOTE — Progress Notes (Signed)
Orthopedic Tech Progress Note Patient Details:  Stacey Wong 1980/12/23 [id number]  Ortho Devices Type of Ortho Device: Knee Immobilizer, Crutches Ortho Device/Splint Interventions: Application   [place] Pink 10/27/2016, 11:51 AM

## 2016-10-27 NOTE — Discharge Instructions (Signed)
Take tylenol or ibuprofen as directed. You can take the pain medication as directed for severe, breakthrough pain.   Follow the RICE (Rest, Ice, Compression, Elevation) protocol as directed.   Follow-up with referred orthopedic doctor for further evaluation.  Return the emergency Department for any worsening pain, redness/swelling of the leg, fever or any other worsening or concerning symptoms.  Use the list below to find a primary care doctor.  If you do not have a primary care doctor you see regularly, please you the list below. Please call them to arrange for follow-up.    No Primary Care Doctor Call Susan Moore Other agencies that provide inexpensive medical care    [place]  [phone]    [facility]  [place]  [phone]    [facility]  [phone]    [facility]  [phone]    [facility]  [phone]

## 2016-10-27 NOTE — ED Notes (Signed)
Ortho paged for patient knee immobilizer and crutches.

## 2016-10-27 NOTE — ED Provider Notes (Signed)
[facility] DEPT Provider Note    By signing my name below, I, Bea Graff, attest that this documentation has been prepared under the direction and in the presence of Providence Lanius, PA-C. Electronically Signed: Bea Graff, ED Scribe. 10/27/16. 11:01 AM.    History   Chief Complaint Chief Complaint  Patient presents with  . Knee Pain   The history is provided by the patient and medical records. No language interpreter was used.    Stacey Wong is a 36 y.o. female who presents to the Emergency Department complaining of left knee pain that began about one week ago after twisting it. She reports associated swelling that resolved. She reports redness to the posterior knee that also resolved and states it feels as if there is a knot in the area. She has taken Tylenol for pain (last dose three days ago) with no significant relief. Walking and bearing weight increases the pain. She denies alleviating factors. She denies fever, chills, numbness, tingling or weakness of the LLE, left foot, ankle or hip pain. She does not have a PCP. She does not currently see an orthopedist.   Past Medical History:  Diagnosis Date  . Asthma   . Bipolar 1 disorder ([place])   . Hypertension     Patient Active Problem List   Diagnosis Date Noted  . Trochanteric bursitis 05/27/2015  . Encounter for chronic pain management 06/16/2014  . Healthcare maintenance 11/10/2013  . Ganglion cyst 06/16/2013  . Numbness and tingling in hands 01/16/2013  . 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 HTN (hypertension) 06/20/2011  . Right hip pain 05/27/2011  . Back pain 05/09/2011  . Panic disorder 09/13/2010  . History of alcohol abuse 05/16/2010  . Asthma 05/03/2010  . GERD 05/03/2010  . SHOULDER PAIN, RIGHT, CHRONIC 04/10/2010  . Tobacco abuse counseling 01/14/2009  . Bipolar disorder ([place]) 05/11/2007  . AMENORRHEA 05/11/2007    Past Surgical History:  Procedure Laterality Date  . CHOLECYSTECTOMY       OB History    No data available       Home Medications    Prior to Admission medications   Medication Sig Start Date End Date Taking? Authorizing Provider  albuterol (PROVENTIL HFA;VENTOLIN HFA) 108 (90 BASE) MCG/ACT inhaler Inhale 2 puffs into the lungs every 6 (six) hours as needed. For shortness of breath. 04/12/15   Archie Patten, MD  beclomethasone (QVAR) 80 MCG/ACT inhaler Inhale 1 puff into the lungs 2 (two) times daily. 07/18/14   Archie Patten, MD  cyclobenzaprine (FLEXERIL) 10 MG tablet Take 1 tablet (10 mg total) by mouth 3 (three) times daily as needed for muscle spasms. 07/27/15   Archie Patten, MD  FLUoxetine (PROZAC) 40 MG capsule Take 1 capsule (40 mg total) by mouth daily. 04/12/15   Archie Patten, MD  HYDROcodone-acetaminophen (NORCO/VICODIN) 5-325 MG tablet Take 2 tablets by mouth every 6 (six) hours as needed. 10/27/16   Volanda Napoleon, PA-C  lamoTRIgine (LAMICTAL) 100 MG tablet Take 2 tablets (200 mg total) by mouth daily. 04/12/15   Archie Patten, MD    Family History History reviewed. No pertinent family history.  Social History Social History  Substance Use Topics  . Smoking status: Current Every Day Smoker    Packs/day: 1.00    Types: Cigarettes  . Smokeless tobacco: Not on file     Comment: decreased smoking  . Alcohol use 0.0 oz/week     Comment: rare     Allergies  Patient has no known allergies.   Review of Systems Review of Systems  Constitutional: Negative for chills and fever.  Musculoskeletal: Positive for arthralgias and joint swelling.  Neurological: Negative for weakness and numbness.     Physical Exam Updated Vital Signs BP (!) 141/83 (BP Location: Left Arm)   Pulse 64   Temp 97.5 F (36.4 C) (Oral)   Resp 16   SpO2 98%   Physical Exam  Constitutional: She appears well-developed and well-nourished.  Sitting comfortably on examination table  HENT:  Head: Normocephalic and atraumatic.  Eyes:  Conjunctivae and EOM are normal. Right eye exhibits no discharge. Left eye exhibits no discharge. No scleral icterus.  Cardiovascular:  Dorsalis Pedis pulses 2+ bilaterally.  Pulmonary/Chest: Effort normal.  Musculoskeletal: Normal range of motion. She exhibits tenderness. She exhibits no edema or deformity.  Bilateral knees are symmetric in size and appearance. Diffuse tenderness over anterior aspect of left knee. No deformity or crepitus noted. No overlying soft tissue swelling, ecchymosis, warmth or erythema. No deformity or crepitus. No palpable mass. Full flexion and extension left lower extremity intact but with reports of pain. Negative anterior and posterior drawer test. No instability with varus or valgus stress. No Calf tenderness bilaterally. No lower extremity erythema or edema.  Neurological: She is alert.  5/5 BLE Moving all extremities spontaneously  Skin: Skin is warm and dry. Capillary refill takes less than 2 seconds.  Foot is warm to touch and is not cool or dusky in appearance.  Psychiatric: She has a normal mood and affect. Her speech is normal and behavior is normal.  Nursing note and vitals reviewed.    ED Treatments / Results  DIAGNOSTIC STUDIES: Oxygen Saturation is 98% on RA, normal by my interpretation.   COORDINATION OF CARE: 10:57 AM- Will provide knee immobilizer and crutches. Will give referral to orthopedist. Pt verbalizes understanding and agrees to plan.  Medications  HYDROcodone-acetaminophen (NORCO/VICODIN) 5-325 MG per tablet 1-2 tablet (2 tablets Oral Given 10/27/16 1113)    Labs (all labs ordered are listed, but only abnormal results are displayed) Labs Reviewed - No data to display  EKG  EKG Interpretation None       Radiology Dg Knee Complete 4 Views Left  Result Date: 10/27/2016 CLINICAL DATA:  Left knee pain. EXAM: LEFT KNEE - COMPLETE 4+ VIEW COMPARISON:  None. FINDINGS: No evidence of fracture, dislocation, or joint effusion. No  evidence of arthropathy or other focal bone abnormality. Soft tissues are unremarkable. IMPRESSION: Negative. Electronically Signed   By: Rolm Baptise M.D.   On: 10/27/2016 10:41    Procedures Procedures (including critical care time)  Medications Ordered in ED Medications  HYDROcodone-acetaminophen (NORCO/VICODIN) 5-325 MG per tablet 1-2 tablet (2 tablets Oral Given 10/27/16 1113)     Initial Impression / Assessment and Plan / ED Course  I have reviewed the triage vital signs and the nursing notes.  Pertinent labs & imaging results that were available during my care of the patient were reviewed by me and considered in my medical decision making (see chart for details).     36 year old female who presents with left knee pain that began one week ago. History of twisting injury but cannot recall specific mechanism. Patient is afebrile, non-toxic appearing, sitting comfortably on examination table. Vital signs reviewed. Patient is slightly hypertensive, likely secondary to pain. Patient is neurovascularly intact. Consider sprain  versus fracture versus dislocation. History/physical exam are not concerning for septic arthritis or DVT. X-rays ordered at triage. Patient  provided analgesics in the department.  X-ray reviewed. Negative for any acute fracture or dislocation. Discussed results with patient. Symptoms likely result of a sprain. We'll plan to provide knee immobilizer and crutches in the department. Patient instructed follow-up with referred orthopedic doctor for further evaluation. Discussed conservative therapy options with patient. Patient requesting additional pain medications since she has used tylenol and ibuprofen with no relief. Explained to patient that knee sprains are typically treated with NSAIDs, but patient states that she "knows that won't work and she'll be back here tonight in pain."  Patient reviewed on [place] substance database. Patient has multiple sporadic prescriptions for  narcotics. Last narcotic prescription was January 2018. Discussed at length with patient regarding narcotic use but she is insistent about pain not controlled conservative measures. Will give very short course of pain medication. Patient instructed follow-up with referred orthopedic doctor. Strict return precautions discussed. Patient expresses understanding and agreement to plan.   Final Clinical Impressions(s) / ED Diagnoses   Final diagnoses:  Sprain of left knee, unspecified ligament, initial encounter    New Prescriptions Discharge Medication List as of 10/27/2016 11:59 AM      I personally performed the services described in this documentation, which was scribed in my presence. The recorded information has been reviewed and is accurate.     Volanda Napoleon, PA-C 10/27/16 1748    Orlie Dakin, MD 10/27/16 [phone]

## 2016-10-27 NOTE — ED Notes (Signed)
Ortho tech in. 

## 2016-10-27 NOTE — ED Triage Notes (Signed)
Pt reports twisting left knee over a week ago and now has pain and swelling.

## 2016-10-27 NOTE — ED Notes (Addendum)
Pt ambulated without difficulty to the desk, briskly and shouted at staff about her wait time for pain medication. PA aware of patient request for pain medication.

## 2017-02-02 ENCOUNTER — Emergency Department (HOSPITAL_COMMUNITY): Payer: Self-pay

## 2017-02-02 ENCOUNTER — Encounter (HOSPITAL_COMMUNITY): Payer: Self-pay | Admitting: Emergency Medicine

## 2017-02-02 DIAGNOSIS — J45909 Unspecified asthma, uncomplicated: Secondary | ICD-10-CM | POA: Insufficient documentation

## 2017-02-02 DIAGNOSIS — F17210 Nicotine dependence, cigarettes, uncomplicated: Secondary | ICD-10-CM | POA: Insufficient documentation

## 2017-02-02 DIAGNOSIS — I10 Essential (primary) hypertension: Secondary | ICD-10-CM | POA: Insufficient documentation

## 2017-02-02 DIAGNOSIS — R51 Headache: Secondary | ICD-10-CM | POA: Insufficient documentation

## 2017-02-02 LAB — BASIC METABOLIC PANEL
ANION GAP: 10 (ref 5–15)
BUN: <5 mg/dL — ABNORMAL LOW (ref 6–20)
CALCIUM: 8.7 mg/dL — AB (ref 8.9–10.3)
CO2: 26 mmol/L (ref 22–32)
Chloride: 101 mmol/L (ref 101–111)
Creatinine, Ser: 0.58 mg/dL (ref 0.44–1.00)
Glucose, Bld: 100 mg/dL — ABNORMAL HIGH (ref 65–99)
POTASSIUM: 2.9 mmol/L — AB (ref 3.5–5.1)
Sodium: 137 mmol/L (ref 135–145)

## 2017-02-02 LAB — CBC
HEMATOCRIT: 42.9 % (ref 36.0–46.0)
HEMOGLOBIN: 15 g/dL (ref 12.0–15.0)
MCH: 30.4 pg (ref 26.0–34.0)
MCHC: 35 g/dL (ref 30.0–36.0)
MCV: 87 fL (ref 78.0–100.0)
Platelets: 258 10*3/uL (ref 150–400)
RBC: 4.93 MIL/uL (ref 3.87–5.11)
RDW: 12.4 % (ref 11.5–15.5)
WBC: 7.7 10*3/uL (ref 4.0–10.5)

## 2017-02-02 LAB — I-STAT TROPONIN, ED: Troponin i, poc: 0 ng/mL (ref 0.00–0.08)

## 2017-02-02 NOTE — ED Triage Notes (Signed)
Pt c/o headache x 6 days and HTN, checked BP at home, 160/100. Pt also c/o heart palpitations denies chest pain/shortness of breath. Hx HTN, lost weight and taken off medications. Reports recent stressors and loss of family members.

## 2017-02-03 ENCOUNTER — Emergency Department (HOSPITAL_COMMUNITY): Payer: Self-pay

## 2017-02-03 ENCOUNTER — Emergency Department (HOSPITAL_COMMUNITY)
Admission: EM | Admit: 2017-02-03 | Discharge: 2017-02-03 | Discharge status: Against Medical Advice | Payer: Self-pay | Attending: Emergency Medicine | Admitting: Emergency Medicine

## 2017-02-03 DIAGNOSIS — R519 Headache, unspecified: Secondary | ICD-10-CM

## 2017-02-03 DIAGNOSIS — R51 Headache: Secondary | ICD-10-CM

## 2017-02-03 LAB — I-STAT BETA HCG BLOOD, ED (MC, WL, AP ONLY)

## 2017-02-03 MED ORDER — KETOROLAC TROMETHAMINE 30 MG/ML IJ SOLN
30.0000 mg | Freq: Once | INTRAMUSCULAR | Status: AC
  Administered 2017-02-03: 30 mg via INTRAVENOUS
  Filled 2017-02-03: qty 1

## 2017-02-03 MED ORDER — IOPAMIDOL (ISOVUE-370) INJECTION 76%
INTRAVENOUS | Status: AC
  Administered 2017-02-03: 03:00:00
  Filled 2017-02-03: qty 50

## 2017-02-03 MED ORDER — POTASSIUM CHLORIDE CRYS ER 20 MEQ PO TBCR
40.0000 meq | EXTENDED_RELEASE_TABLET | Freq: Once | ORAL | Status: AC
  Administered 2017-02-03: 40 meq via ORAL
  Filled 2017-02-03: qty 2

## 2017-02-03 MED ORDER — PROCHLORPERAZINE EDISYLATE 5 MG/ML IJ SOLN
10.0000 mg | Freq: Once | INTRAMUSCULAR | Status: AC
  Administered 2017-02-03: 10 mg via INTRAVENOUS
  Filled 2017-02-03: qty 2

## 2017-02-03 MED ORDER — DIPHENHYDRAMINE HCL 50 MG/ML IJ SOLN
25.0000 mg | Freq: Once | INTRAMUSCULAR | Status: AC
  Administered 2017-02-03: 25 mg via INTRAVENOUS
  Filled 2017-02-03: qty 1

## 2017-02-03 MED ORDER — DEXAMETHASONE SODIUM PHOSPHATE 10 MG/ML IJ SOLN
10.0000 mg | Freq: Once | INTRAMUSCULAR | Status: AC
  Administered 2017-02-03: 10 mg via INTRAVENOUS
  Filled 2017-02-03: qty 1

## 2017-02-03 MED ORDER — SODIUM CHLORIDE 0.9 % IV BOLUS (SEPSIS)
1000.0000 mL | Freq: Once | INTRAVENOUS | Status: AC
  Administered 2017-02-03: 1000 mL via INTRAVENOUS

## 2017-02-03 NOTE — ED Notes (Signed)
Pt to CT via stretcher

## 2017-02-03 NOTE — ED Provider Notes (Signed)
[facility] DEPT Provider Note   CSN: [id number] Arrival date & time: 02/02/17  2048     History   Chief Complaint Chief Complaint  Patient presents with  . Hypertension  . Headache    HPI Stacey Wong is a 36 y.o. female.  Patient presents to the emergency department for evaluation of headache. Patient reports that she has had a headache has been bitemporal and frontal for 6 days. Pain waxes and wanes but has never resolved. She has not identified anything that makes it better or worse. She has taken over-the-counter medicines without relief. She does not normally have headaches. No numbness, tingling, weakness of extremities. Tonight she has noticed some palpitations without chest pain. She does report that she is under stress. She took her blood pressure at home and it was elevated. She has a history of hypertension but has been taken off her meds after she lost weight and her blood pressures improved.      Past Medical History:  Diagnosis Date  . Asthma   . Bipolar 1 disorder ([place])   . Hypertension     Patient Active Problem List   Diagnosis Date Noted  . Trochanteric bursitis 05/27/2015  . Encounter for chronic pain management 06/16/2014  . Healthcare maintenance 11/10/2013  . Ganglion cyst 06/16/2013  . Numbness and tingling in hands 01/16/2013  . 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 HTN (hypertension) 06/20/2011  . Right hip pain 05/27/2011  . Back pain 05/09/2011  . Panic disorder 09/13/2010  . History of alcohol abuse 05/16/2010  . Asthma 05/03/2010  . GERD 05/03/2010  . SHOULDER PAIN, RIGHT, CHRONIC 04/10/2010  . Tobacco abuse counseling 01/14/2009  . Bipolar disorder ([place]) 05/11/2007  . AMENORRHEA 05/11/2007    Past Surgical History:  Procedure Laterality Date  . CHOLECYSTECTOMY      OB History    No data available       Home Medications    Prior to Admission medications   Medication Sig Start Date End Date Taking? Authorizing Provider    acetaminophen (TYLENOL) 325 MG tablet Take 650 mg by mouth every 6 (six) hours as needed for mild pain or fever.   Yes [provider]  aspirin 81 MG chewable tablet Chew 162 mg by mouth daily as needed for mild pain or fever.   Yes [provider]  ibuprofen (ADVIL,MOTRIN) 200 MG tablet Take 200 mg by mouth every 6 (six) hours as needed for mild pain.   Yes [provider]  albuterol (PROVENTIL HFA;VENTOLIN HFA) 108 (90 BASE) MCG/ACT inhaler Inhale 2 puffs into the lungs every 6 (six) hours as needed. For shortness of breath. Patient not taking: Reported on 02/03/2017 04/12/15   Archie Patten, MD  beclomethasone (QVAR) 80 MCG/ACT inhaler Inhale 1 puff into the lungs 2 (two) times daily. Patient not taking: Reported on 02/03/2017 07/18/14   Archie Patten, MD  cyclobenzaprine (FLEXERIL) 10 MG tablet Take 1 tablet (10 mg total) by mouth 3 (three) times daily as needed for muscle spasms. Patient not taking: Reported on 02/03/2017 07/27/15   Archie Patten, MD  FLUoxetine (PROZAC) 40 MG capsule Take 1 capsule (40 mg total) by mouth daily. Patient not taking: Reported on 02/03/2017 04/12/15   Archie Patten, MD  lamoTRIgine (LAMICTAL) 100 MG tablet Take 2 tablets (200 mg total) by mouth daily. Patient not taking: Reported on 02/03/2017 04/12/15   Archie Patten, MD    Family History No family history on file.  Social History Social History  Substance Use Topics  . Smoking status: Current Every Day Smoker    Packs/day: 1.00    Types: Cigarettes  . Smokeless tobacco: Never Used     Comment: decreased smoking  . Alcohol use 0.0 oz/week     Comment: rare     Allergies   Patient has no known allergies.   Review of Systems Review of Systems  Cardiovascular: Positive for palpitations.  Neurological: Positive for headaches.  All other systems reviewed and are negative.    Physical Exam Updated Vital Signs BP (!) 148/97   Pulse 65   Temp 97.8 F  (36.6 C) (Oral)   Resp 16   Ht 5\' 4"  (1.626 m)   Wt 68 kg (150 lb)   LMP 01/14/2017   SpO2 98%   BMI 25.75 kg/m   Physical Exam  Constitutional: She is oriented to person, place, and time. She appears well-developed and well-nourished. No distress.  HENT:  Head: Normocephalic and atraumatic.  Right Ear: Hearing normal.  Left Ear: Hearing normal.  Nose: Nose normal.  Mouth/Throat: Oropharynx is clear and moist and mucous membranes are normal.  Eyes: Pupils are equal, round, and reactive to light. Conjunctivae and EOM are normal.  Neck: Normal range of motion. Neck supple.  Cardiovascular: Regular rhythm, S1 normal and S2 normal.  Exam reveals no gallop and no friction rub.   No murmur heard. Pulmonary/Chest: Effort normal and breath sounds normal. No respiratory distress. She exhibits no tenderness.  Abdominal: Soft. Normal appearance and bowel sounds are normal. There is no hepatosplenomegaly. There is no tenderness. There is no rebound, no guarding, no tenderness at McBurney's point and negative Murphy's sign. No hernia.  Musculoskeletal: Normal range of motion.  Neurological: She is alert and oriented to person, place, and time. She has normal strength. No cranial nerve deficit or sensory deficit. Coordination normal. GCS eye subscore is 4. GCS verbal subscore is 5. GCS motor subscore is 6.  Extraocular muscle movement: normal No visual field cut Pupils: equal and reactive both direct and consensual response is normal No nystagmus present    Sensory function is intact to light touch, pinprick Proprioception intact  Grip strength 5/5 symmetric in upper extremities No pronator drift Normal finger to nose bilaterally  Lower extremity strength 5/5 against gravity Normal heel to shin bilaterally  Gait: normal   Skin: Skin is warm, dry and intact. No rash noted. No cyanosis.  Psychiatric: She has a normal mood and affect. Her speech is normal and behavior is normal. Thought  content normal.  Nursing note and vitals reviewed.    ED Treatments / Results  Labs (all labs ordered are listed, but only abnormal results are displayed) Labs Reviewed  BASIC METABOLIC PANEL - Abnormal; Notable for the following:       Result Value   Potassium 2.9 (*)    Glucose, Bld 100 (*)    BUN <5 (*)    Calcium 8.7 (*)    All other components within normal limits  CBC  I-STAT TROPONIN, ED  I-STAT BETA HCG BLOOD, ED ([facility], [facility], [facility] ONLY)    EKG  EKG Interpretation  Date/Time:  Sunday February 02 2017 21:31:42 EDT Ventricular Rate:  67 PR Interval:  104 QRS Duration: 84 QT Interval:  424 QTC Calculation: 448 R Axis:   75 Text Interpretation:  Sinus rhythm with short PR Abnormal QRS-T angle, consider primary T wave abnormality Abnormal ECG No previous tracing Confirmed by Joseph Berkshire  [phone]) on 02/03/2017 1:20:16 AM       Radiology Ct Angio Head W Or Wo Contrast  Result Date: 02/03/2017 CLINICAL DATA:  Thunderclap headache and hypertension EXAM: CT ANGIOGRAPHY HEAD TECHNIQUE: Multidetector CT imaging of the head was performed using the standard protocol during bolus administration of intravenous contrast. Multiplanar CT image reconstructions and MIPs were obtained to evaluate the vascular anatomy. CONTRAST:  50 mL Isovue 370 COMPARISON:  None. FINDINGS: CT HEAD Brain: No mass lesion, intraparenchymal hemorrhage or extra-axial collection. No evidence of acute cortical infarct. Brain parenchyma and CSF-containing spaces are normal for age. Vascular: No hyperdense vessel or unexpected calcification. Skull: Normal visualized skull base, calvarium and extracranial soft tissues. Sinuses/Orbits: No sinus fluid levels or advanced mucosal thickening. No mastoid effusion. Normal orbits. CTA HEAD Anterior circulation: No stenosis, proximal occlusion, aneurysm, or vascular malformation. Posterior circulation: No stenosis, proximal occlusion, aneurysm, or vascular malformation.  Venous sinuses: As permitted by contrast timing, patent. 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 Absent posterior communicating arteries. Delayed phase: No abnormal intracranial enhancement. IMPRESSION: Normal head CT. Normal CTA of the intracranial circulation. No stenosis or aneurysm. Electronically Signed   By: Ulyses Jarred M.D.   On: 02/03/2017 03:19   Dg Chest 2 View  Result Date: 02/02/2017 CLINICAL DATA:  Palpitation EXAM: CHEST  2 VIEW COMPARISON:  None. FINDINGS: Normal heart size. Normal mediastinal contour. No pneumothorax. No pleural effusion. Lungs appear clear, with no acute consolidative airspace disease and no pulmonary edema. IMPRESSION: No active cardiopulmonary disease. Electronically Signed   By: Ilona Sorrel M.D.   On: 02/02/2017 21:58    Procedures Procedures (including critical care time)  Medications Ordered in ED Medications  potassium chloride SA (K-DUR,KLOR-CON) CR tablet 40 mEq (40 mEq Oral Given 02/03/17 0201)  ketorolac (TORADOL) 30 MG/ML injection 30 mg (30 mg Intravenous Given 02/03/17 0155)  prochlorperazine (COMPAZINE) injection 10 mg (10 mg Intravenous Given 02/03/17 0158)  diphenhydrAMINE (BENADRYL) injection 25 mg (25 mg Intravenous Given 02/03/17 0151)  sodium chloride 0.9 % bolus 1,000 mL (0 mLs Intravenous Stopped 02/03/17 0251)  dexamethasone (DECADRON) injection 10 mg (10 mg Intravenous Given 02/03/17 0152)  iopamidol (ISOVUE-370) 76 % injection (  Contrast Given 02/03/17 0230)     Initial Impression / Assessment and Plan / ED Course  I have reviewed the triage vital signs and the nursing notes.  Pertinent labs & imaging results that were available during my care of the patient were reviewed by me and considered in my medical decision making (see chart for details).     Patient presented to the emergency department for evaluation of headache. She has had a headache for 6 days. Pain has been  waxing and waning but did not resolve at any time. She has used over-the-counter medicines without improvement. She had a normal neurologic examination. Based on the fact that she does not normally have headaches and this was a severe headache, workup was initiated. Lab work was unremarkable. She did have complaints of heart palpitations without chest pain. This is likely secondary to anxiety. No arrhythmia noted here. Cardiac evaluation negative. Patient underwent CT angiography of head, no acute pathology noted. Unfortunately, patient left the emergency department before taking for these results. As her workup was negative, however, she does not require any further evaluation.  Final Clinical Impressions(s) / ED Diagnoses   Final diagnoses:  Bad headache    New Prescriptions Discharge Medication List as of 02/03/2017  3:23  AM       Orpah Greek, MD 02/03/17 [phone]

## 2018-03-30 ENCOUNTER — Ambulatory Visit (INDEPENDENT_AMBULATORY_CARE_PROVIDER_SITE_OTHER): Payer: Self-pay | Admitting: Family Medicine

## 2018-03-30 DIAGNOSIS — M25551 Pain in right hip: Secondary | ICD-10-CM | POA: Insufficient documentation

## 2018-03-30 DIAGNOSIS — G8929 Other chronic pain: Secondary | ICD-10-CM

## 2018-03-30 NOTE — Progress Notes (Signed)
   Subjective   Patient ID: [name]    DOB: 05-Aug-1980, 37 y.o. female   MRN: [id number]  CC: "I need pain medication"  HPI: Stacey Wong is a 37 y.o. female who presents to clinic today for the following:  Right hip pain: Patient has long-standing dysplasia of the right hip.  She is here today to "reestablish to get pain medications."  Patient has a history of Vicodin use with "120 tablets monthly,"  but discontinued medication after she "transferred her PCP after losing Medicaid in order to obtain her inheritance."  Patient was unable to obtain the funds that she was hoping in is working on establishing with a new Medicaid PCP.  She has a history of Mobic use which was reported to resolve her symptoms, however she says this has not been the case.  She is also on cyclobenzaprine with some improvement.  Patient has received a hip injection but did not have improvement and she did not want to consider "experimental surgical options."  Patient stated she is "simply wants her pain medication."  She was very tearful on exam and felt like her family was against her.  She does report a history of alcohol use disorder but was sober for 6 years although her husband who accompanied her stated that she does have intermittent use of alcohol.    ROS: see HPI for pertinent.  [place]: HTN, smoker, history of alcohol use disorder, bipolar disorder, asthma, AUB.  Surgical history cholecystectomy.  Family history unremarkable.  Smoking status reviewed. Medications reviewed.  Objective   BP 118/80   Pulse 83   Temp 98.4 F (36.9 C) (Oral)   Wt 132 lb (59.9 kg)   SpO2 94%   BMI 22.66 kg/m  Vitals and nursing note reviewed.  Unable to perform. Patient left before I was able to examine her.  Assessment & Plan   Chronic right hip pain Chronic.  History of opioid use.  Claims to have exhausted other options but unwilling to try more conservative and appropriate treatment.  When offered cyclobenzaprine  and Mobic, patient stormed out stating she "would look for a different doctor who would give her pain medications."  No orders of the defined types were placed in this encounter.  No orders of the defined types were placed in this encounter.   Harriet Butte, [place], PGY-3 03/31/2018, 8:58 AM

## 2018-03-30 NOTE — Assessment & Plan Note (Signed)
Chronic.  History of opioid use.  Claims to have exhausted other options but unwilling to try more conservative and appropriate treatment.  When offered cyclobenzaprine and Mobic, patient stormed out stating she "would look for a different doctor who would give her pain medications."

## 2018-05-04 ENCOUNTER — Encounter: Payer: Self-pay | Admitting: Family Medicine

## 2018-05-04 NOTE — Patient Instructions (Signed)
Dr. Yisroel Ramming contacted me regarding this patient's behavior during her most recent clinic visit on Dec 2nd (Please see his note).  I called the patient to discuss the visit with her as well as her pain management. The plan was to offer a referral to the pain clinic while Dr. Yisroel Ramming takes care of her other health problem.  The patient was not interested in talking to me; she cursed at me as well. She stated that we had released her medical record to someone else. I am not sure what that is about, although I asked her to clarify, she will not clarify the statement. She said she does not want anyone in [place] taking care of her health since we are not willing to help with her pain medicine. She is planning to find a health care provider in [place].   Although she is not interested in [place] providing care for her any longer, we will see her for urgent care needs for the next 30 days and then take her off our patient panelist.  A dismissal letter will be sent to her today.

## 2018-11-16 ENCOUNTER — Emergency Department: Payer: Self-pay

## 2018-11-16 ENCOUNTER — Emergency Department
Admission: EM | Admit: 2018-11-16 | Discharge: 2018-11-16 | Disposition: A | Discharge status: Home / Self Care | Payer: Self-pay | Attending: Emergency Medicine | Admitting: Emergency Medicine

## 2018-11-16 ENCOUNTER — Encounter: Payer: Self-pay | Admitting: Emergency Medicine

## 2018-11-16 ENCOUNTER — Other Ambulatory Visit: Payer: Self-pay

## 2018-11-16 DIAGNOSIS — F17210 Nicotine dependence, cigarettes, uncomplicated: Secondary | ICD-10-CM | POA: Insufficient documentation

## 2018-11-16 DIAGNOSIS — E876 Hypokalemia: Secondary | ICD-10-CM | POA: Insufficient documentation

## 2018-11-16 DIAGNOSIS — M25551 Pain in right hip: Secondary | ICD-10-CM

## 2018-11-16 DIAGNOSIS — Z9181 History of falling: Secondary | ICD-10-CM | POA: Insufficient documentation

## 2018-11-16 DIAGNOSIS — M79604 Pain in right leg: Secondary | ICD-10-CM | POA: Insufficient documentation

## 2018-11-16 DIAGNOSIS — I10 Essential (primary) hypertension: Secondary | ICD-10-CM | POA: Insufficient documentation

## 2018-11-16 DIAGNOSIS — G8929 Other chronic pain: Secondary | ICD-10-CM | POA: Insufficient documentation

## 2018-11-16 DIAGNOSIS — Z79899 Other long term (current) drug therapy: Secondary | ICD-10-CM | POA: Insufficient documentation

## 2018-11-16 LAB — CBC WITH DIFFERENTIAL/PLATELET
Abs Immature Granulocytes: 0.03 10*3/uL (ref 0.00–0.07)
Basophils Absolute: 0.1 10*3/uL (ref 0.0–0.1)
Basophils Relative: 1 %
Eosinophils Absolute: 0.1 10*3/uL (ref 0.0–0.5)
Eosinophils Relative: 1 %
HCT: 43.6 % (ref 36.0–46.0)
Hemoglobin: 15.7 g/dL — ABNORMAL HIGH (ref 12.0–15.0)
Immature Granulocytes: 0 %
Lymphocytes Relative: 38 %
Lymphs Abs: 3.3 10*3/uL (ref 0.7–4.0)
MCH: 33.2 pg (ref 26.0–34.0)
MCHC: 36 g/dL (ref 30.0–36.0)
MCV: 92.2 fL (ref 80.0–100.0)
Monocytes Absolute: 0.4 10*3/uL (ref 0.1–1.0)
Monocytes Relative: 5 %
Neutro Abs: 4.9 10*3/uL (ref 1.7–7.7)
Neutrophils Relative %: 55 %
Platelets: 281 10*3/uL (ref 150–400)
RBC: 4.73 MIL/uL (ref 3.87–5.11)
RDW: 13 % (ref 11.5–15.5)
WBC: 8.7 10*3/uL (ref 4.0–10.5)
nRBC: 0 % (ref 0.0–0.2)

## 2018-11-16 LAB — COMPREHENSIVE METABOLIC PANEL
ALT: 12 U/L (ref 0–44)
AST: 19 U/L (ref 15–41)
Albumin: 3.8 g/dL (ref 3.5–5.0)
Alkaline Phosphatase: 63 U/L (ref 38–126)
Anion gap: 10 (ref 5–15)
BUN: <5 mg/dL — ABNORMAL LOW (ref 6–20)
CO2: 27 mmol/L (ref 22–32)
Calcium: 8.8 mg/dL — ABNORMAL LOW (ref 8.9–10.3)
Chloride: 99 mmol/L (ref 98–111)
Creatinine, Ser: 0.54 mg/dL (ref 0.44–1.00)
GFR calc Af Amer: >60 mL/min (ref >60)
GFR calc non Af Amer: >60 mL/min (ref >60)
Glucose, Bld: 105 mg/dL — ABNORMAL HIGH (ref 70–99)
Potassium: 2.6 mmol/L — CL (ref 3.5–5.1)
Sodium: 136 mmol/L (ref 135–145)
Total Bilirubin: 0.9 mg/dL (ref 0.3–1.2)
Total Protein: 6.5 g/dL (ref 6.5–8.1)

## 2018-11-16 MED ORDER — POTASSIUM CHLORIDE CRYS ER 20 MEQ PO TBCR
30.0000 meq | EXTENDED_RELEASE_TABLET | Freq: Once | ORAL | Status: AC
  Administered 2018-11-16: 30 meq via ORAL
  Filled 2018-11-16: qty 2

## 2018-11-16 MED ORDER — POTASSIUM CHLORIDE CRYS ER 20 MEQ PO TBCR: 20.0000 meq | EXTENDED_RELEASE_TABLET | Freq: Once | ORAL | 0 refills | Status: DC

## 2018-11-16 MED ORDER — MELOXICAM 15 MG PO TABS: 15.0000 mg | ORAL_TABLET | Freq: Every day | ORAL | 0 refills | Status: AC

## 2018-11-16 NOTE — Discharge Instructions (Signed)
Follow-up with Dr. Mack Guise who is the orthopedist on call.  You are going to need an evaluation of your right hip.  Begin taking meloxicam 15 mg 1 daily with food.  Also while you are in the ED your lab work indicated that your potassium was low and you were given your first tablet here in the ED.  A prescription for continued potassium was sent to your pharmacy.  You should follow-up with your primary care provider in 1 week to have this rechecked.

## 2018-11-16 NOTE — ED Triage Notes (Signed)
Says she has history of right hip displasia.  For the past 2 weeks she cannot lift her right foot.  No pain, circulation good.

## 2018-11-16 NOTE — ED Notes (Addendum)
See triage note states she has hip dysplasia and she falls d/t weakness to right leg  States she had a fall a few days ago  Now feels like she is not able to move her foot  Feels heavy  No swelling noted

## 2018-11-16 NOTE — ED Provider Notes (Signed)
[facility] Emergency Department Provider Note  ____________________________________________   First MD Initiated Contact with Patient 11/16/18 1102     (approximate)  I have reviewed the triage vital signs and the nursing notes.   HISTORY  Chief Complaint Foot Problem   HPI Stacey Wong is a 38 y.o. female presents to the ED with complaint of right hip pain stating that she fell several days ago.  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  She also complains of her lower leg hurting especially with weightbearing.  She denies any head injury or loss of consciousness during her fall.  She also states that she feels weak when she is walking.  She rates her pain as 7 out of 10 presently.     Past Medical History:  Diagnosis Date  . Asthma   . Bipolar 1 disorder ([place])   . Hypertension     Patient Active Problem List   Diagnosis Date Noted  . Chronic right hip pain 03/30/2018  . Trochanteric bursitis 05/27/2015  . Encounter for chronic pain management 06/16/2014  . Healthcare maintenance 11/10/2013  . Ganglion cyst 06/16/2013  . Numbness and tingling in hands 01/16/2013  . 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 HTN (hypertension) 06/20/2011  . Right hip pain 05/27/2011  . Back pain 05/09/2011  . Panic disorder 09/13/2010  . History of alcohol abuse 05/16/2010  . Asthma 05/03/2010  . GERD 05/03/2010  . SHOULDER PAIN, RIGHT, CHRONIC 04/10/2010  . Tobacco abuse counseling 01/14/2009  . Bipolar disorder ([place]) 05/11/2007  . AMENORRHEA 05/11/2007    Past Surgical History:  Procedure Laterality Date  . CHOLECYSTECTOMY      Prior to Admission medications   Medication Sig Start Date End Date Taking? Authorizing Provider  acetaminophen (TYLENOL) 325 MG tablet Take 650 mg by mouth every 6 (six) hours as needed for mild pain or fever.    [provider]  aspirin 81 MG chewable tablet Chew 162  mg by mouth daily as needed for mild pain or fever.    [provider]  ibuprofen (ADVIL,MOTRIN) 200 MG tablet Take 200 mg by mouth every 6 (six) hours as needed for mild pain.    [provider]  meloxicam (MOBIC) 15 MG tablet Take 1 tablet (15 mg total) by mouth daily. 11/16/18 11/16/19  Johnn Hai, PA-C  potassium chloride SA (K-DUR) 20 MEQ tablet Take 1 tablet (20 mEq total) by mouth once for 1 dose. 11/16/18 11/16/18  Johnn Hai, PA-C    Allergies Patient has no known allergies.  No family history on file.  Social History Social History   Tobacco Use  . Smoking status: Current Every Day Smoker    Packs/day: 1.00    Types: Cigarettes  . Smokeless tobacco: Never Used  . Tobacco comment: decreased smoking  Substance Use Topics  . Alcohol use: Yes    Alcohol/week: 0.0 standard drinks    Comment: rare  . Drug use: No    Review of Systems Constitutional: No fever/chills Cardiovascular: Denies chest pain. Respiratory: Denies shortness of breath. Gastrointestinal: No abdominal pain.  No nausea, no vomiting.  Musculoskeletal: Positive for right hip, right leg, right lower leg pain. Skin: Negative for rash. Neurological: Negative for headaches, focal weakness or numbness. ___________________________________________   PHYSICAL EXAM:  VITAL SIGNS: ED Triage Vitals [11/16/18 1053]  Enc Vitals Group     BP (!) 131/99  Pulse Rate 100     Resp 16     Temp 98.4 F (36.9 C)     Temp Source Oral     SpO2 97 %     Weight 135 lb (61.2 kg)     Height 5\' 4"  (1.626 m)     Head Circumference      Peak Flow      Pain Score 7     Pain Loc      Pain Edu?      Excl. in [place]?    Constitutional: Alert and oriented. Well appearing and in no acute distress. Eyes: Conjunctivae are normal.  Head: Atraumatic. Neck: No stridor.   Cardiovascular: Normal rate, regular rhythm. Grossly normal heart sounds.  Good peripheral circulation. Respiratory: Normal  respiratory effort.  No retractions. Lungs CTAB. Gastrointestinal: Soft and nontender. No distention.  Musculoskeletal: Examination of the right hip there is no bruises or soft tissue injury noted.  Range of motion limited secondary to patient's pain.  On examination of the thigh and lower leg there is no gross deformity.  No crepitus is noted with range of motion of the knee and no effusion is obvious.  Patient is able to flex and extend her foot without assistance.  Patient is ambulatory without assistance. Neurologic:  Normal speech and language. No gross focal neurologic deficits are appreciated.  Skin:  Skin is warm, dry and intact.  No abrasions or skin discoloration noted. Psychiatric: Mood and affect are normal. Speech and behavior are normal.  ____________________________________________   LABS (all labs ordered are listed, but only abnormal results are displayed)  Labs Reviewed  CBC WITH DIFFERENTIAL/PLATELET - Abnormal; Notable for the following components:      Result Value   Hemoglobin 15.7 (*)    All other components within normal limits  COMPREHENSIVE METABOLIC PANEL - Abnormal; Notable for the following components:   Potassium 2.6 (*)    Glucose, Bld 105 (*)    BUN <5 (*)    Calcium 8.8 (*)    All other components within normal limits    RADIOLOGY   Official radiology report(s): Dg Tibia/fibula Right  Result Date: 11/16/2018 CLINICAL DATA:  Right foot weakness. History of right hip dysplasia. EXAM: RIGHT TIBIA AND FIBULA - 2 VIEW COMPARISON:  Right femur radiographs obtained at the same time. FINDINGS: Small oval soft tissue calcifications/phleboliths. Normal appearing bones. IMPRESSION: No acute abnormality. Electronically Signed   By: Claudie Revering M.D.   On: 11/16/2018 12:06   Dg Femur Min 2 Views Right  Result Date: 11/16/2018 CLINICAL DATA:  Pain/fall. EXAM: RIGHT FEMUR 2 VIEWS COMPARISON:  No recent. FINDINGS: No acute bony or joint abnormality identified. No  evidence of fracture dislocation. Deformity noted of the right femoral head cough possibly related old a vascular necrosis or dysplasia. IMPRESSION: 1.  No acute abnormality. 2. Deformity noted of the right femoral head, possibly related to old a vascular necrosis or dysplasia. Electronically Signed   By: Marcello Moores  Register   On: 11/16/2018 12:08    ____________________________________________   PROCEDURES  Procedure(s) performed (including Critical Care):  Procedures   ____________________________________________   INITIAL IMPRESSION / ASSESSMENT AND PLAN / ED COURSE  As part of my medical decision making, I reviewed the following data within the electronic MEDICAL RECORD NUMBER Notes from prior ED visits and [place] Controlled Substance Database  38 year old female presents to the ED with complaint of right hip pain which is also chronic along with right lower leg pain  from a reported fall several days ago.  Patient denied any head injury or loss of consciousness during this fall.  She is very angry that she has received family-planning Medicaid.  She also voices that she is extremely angry that she gave up alcohol to be placed on pain medication and now she has been cut off of pain medication by her PCP.  Patient was reassured that her hip is stable at this time but was encouraged to follow-up with Dr. Mack Guise who is the orthopedist on call.  She was given a prescription for meloxicam 15 mg to take 1 daily.  Also incidental finding of hypo-kalemia and patient was made aware.  Her first dose of potassium was given in the ED with a prescription to continue 1 daily and see her PCP to have her potassium rechecked in 1 week.  Patient left disgruntled still voicing that it is "all yalls fault" that she does not have the right Medicaid.   ____________________________________________   FINAL CLINICAL IMPRESSION(S) / ED DIAGNOSES  Final diagnoses:  Hypokalemia  Chronic right hip pain  Chronic pain of  right lower extremity     ED Discharge Orders         Ordered    potassium chloride SA (K-DUR) 20 MEQ tablet   Once     11/16/18 1255    meloxicam (MOBIC) 15 MG tablet  Daily     11/16/18 1255           Note:  This document was prepared using Dragon voice recognition software and may include unintentional dictation errors.    Johnn Hai, PA-C 11/16/18 1505    Harvest Dark, MD 11/16/18 1520

## 2018-11-16 NOTE — ED Notes (Signed)
Signature pad in room not working. Patient verbalized understanding of discharge instructions and follow-up care. Ambulatory to lobby with steady gait and NAD noted.

## 2019-08-19 ENCOUNTER — Other Ambulatory Visit: Payer: Self-pay

## 2019-08-19 ENCOUNTER — Emergency Department
Admission: EM | Admit: 2019-08-19 | Discharge: 2019-08-19 | Disposition: A | Discharge status: Home / Self Care | Payer: Medicaid Other | Attending: Emergency Medicine | Admitting: Emergency Medicine

## 2019-08-19 ENCOUNTER — Emergency Department: Payer: Medicaid Other

## 2019-08-19 DIAGNOSIS — Z5321 Procedure and treatment not carried out due to patient leaving prior to being seen by health care provider: Secondary | ICD-10-CM | POA: Insufficient documentation

## 2019-08-19 DIAGNOSIS — R079 Chest pain, unspecified: Secondary | ICD-10-CM | POA: Insufficient documentation

## 2019-08-19 LAB — CBC
HCT: 42.8 % (ref 36.0–46.0)
Hemoglobin: 15.4 g/dL — ABNORMAL HIGH (ref 12.0–15.0)
MCH: 34.8 pg — ABNORMAL HIGH (ref 26.0–34.0)
MCHC: 36 g/dL (ref 30.0–36.0)
MCV: 96.8 fL (ref 80.0–100.0)
Platelets: 340 10*3/uL (ref 150–400)
RBC: 4.42 MIL/uL (ref 3.87–5.11)
RDW: 13.6 % (ref 11.5–15.5)
WBC: 5.5 10*3/uL (ref 4.0–10.5)
nRBC: 0 % (ref 0.0–0.2)

## 2019-08-19 LAB — BASIC METABOLIC PANEL
Anion gap: 13 (ref 5–15)
BUN: <5 mg/dL — ABNORMAL LOW (ref 6–20)
CO2: 27 mmol/L (ref 22–32)
Calcium: 8.8 mg/dL — ABNORMAL LOW (ref 8.9–10.3)
Chloride: 101 mmol/L (ref 98–111)
Creatinine, Ser: 0.59 mg/dL (ref 0.44–1.00)
GFR calc Af Amer: >60 mL/min (ref >60)
GFR calc non Af Amer: >60 mL/min (ref >60)
Glucose, Bld: 98 mg/dL (ref 70–99)
Potassium: 2.6 mmol/L — CL (ref 3.5–5.1)
Sodium: 141 mmol/L (ref 135–145)

## 2019-08-19 LAB — TROPONIN I (HIGH SENSITIVITY): Troponin I (High Sensitivity): 3 ng/L (ref <18)

## 2019-08-19 MED ORDER — SODIUM CHLORIDE 0.9% FLUSH: 3.0000 mL | Freq: Once | INTRAVENOUS | Status: DC

## 2019-08-19 NOTE — ED Triage Notes (Addendum)
Pt coems via POV from home with c/o CP and some SOB. Pt states the CP has been going on for awhile. Pt states she has been having heart palpitations.  Pt states mid sternal chest pain that is heavy pressure.   Pt states she has been under lots of stress with her dad having cancer. Pt states she also has anxiety. Pt tearful in triage.

## 2019-08-19 NOTE — ED Notes (Signed)
Called and spoke to patient's husband and he said she felt it was a panic attack and they left.

## 2019-08-23 ENCOUNTER — Telehealth: Payer: Self-pay | Admitting: Emergency Medicine

## 2019-08-23 NOTE — Telephone Encounter (Signed)
Called patient due to lwot to inquire about condition and follow up plans. Person who answered says she is sleeping, but that they ended up going to another doctor when it happened again.  I told him to let here know I called and that labs are available for her doctor.

## 2021-03-16 ENCOUNTER — Other Ambulatory Visit: Payer: Self-pay

## 2021-03-16 ENCOUNTER — Inpatient Hospital Stay (HOSPITAL_COMMUNITY)
Admission: EM | Admit: 2021-03-16 | Discharge: 2021-03-26 | DRG: 896 | Disposition: A | Discharge status: Home / Self Care | Payer: Self-pay | Attending: Internal Medicine | Admitting: Internal Medicine

## 2021-03-16 ENCOUNTER — Emergency Department (HOSPITAL_COMMUNITY): Payer: Self-pay

## 2021-03-16 ENCOUNTER — Encounter (HOSPITAL_COMMUNITY): Payer: Self-pay | Admitting: Emergency Medicine

## 2021-03-16 DIAGNOSIS — E7220 Disorder of urea cycle metabolism, unspecified: Secondary | ICD-10-CM | POA: Diagnosis present

## 2021-03-16 DIAGNOSIS — F10231 Alcohol dependence with withdrawal delirium: Principal | ICD-10-CM | POA: Diagnosis present

## 2021-03-16 DIAGNOSIS — Q6589 Other specified congenital deformities of hip: Secondary | ICD-10-CM

## 2021-03-16 DIAGNOSIS — G8929 Other chronic pain: Secondary | ICD-10-CM

## 2021-03-16 DIAGNOSIS — R7989 Other specified abnormal findings of blood chemistry: Secondary | ICD-10-CM | POA: Diagnosis present

## 2021-03-16 DIAGNOSIS — G4700 Insomnia, unspecified: Secondary | ICD-10-CM | POA: Diagnosis present

## 2021-03-16 DIAGNOSIS — F319 Bipolar disorder, unspecified: Secondary | ICD-10-CM | POA: Diagnosis present

## 2021-03-16 DIAGNOSIS — Z79899 Other long term (current) drug therapy: Secondary | ICD-10-CM

## 2021-03-16 DIAGNOSIS — F410 Panic disorder [episodic paroxysmal anxiety] without agoraphobia: Secondary | ICD-10-CM | POA: Diagnosis present

## 2021-03-16 DIAGNOSIS — L89156 Pressure-induced deep tissue damage of sacral region: Secondary | ICD-10-CM | POA: Diagnosis present

## 2021-03-16 DIAGNOSIS — J9601 Acute respiratory failure with hypoxia: Secondary | ICD-10-CM | POA: Diagnosis present

## 2021-03-16 DIAGNOSIS — F259 Schizoaffective disorder, unspecified: Secondary | ICD-10-CM | POA: Diagnosis present

## 2021-03-16 DIAGNOSIS — R579 Shock, unspecified: Secondary | ICD-10-CM | POA: Diagnosis present

## 2021-03-16 DIAGNOSIS — F17210 Nicotine dependence, cigarettes, uncomplicated: Secondary | ICD-10-CM | POA: Diagnosis present

## 2021-03-16 DIAGNOSIS — Z0189 Encounter for other specified special examinations: Secondary | ICD-10-CM

## 2021-03-16 DIAGNOSIS — Z681 Body mass index (BMI) 19 or less, adult: Secondary | ICD-10-CM

## 2021-03-16 DIAGNOSIS — J45909 Unspecified asthma, uncomplicated: Secondary | ICD-10-CM | POA: Diagnosis present

## 2021-03-16 DIAGNOSIS — G894 Chronic pain syndrome: Secondary | ICD-10-CM | POA: Diagnosis present

## 2021-03-16 DIAGNOSIS — G928 Other toxic encephalopathy: Secondary | ICD-10-CM | POA: Diagnosis present

## 2021-03-16 DIAGNOSIS — E349 Endocrine disorder, unspecified: Secondary | ICD-10-CM | POA: Diagnosis present

## 2021-03-16 DIAGNOSIS — L03221 Cellulitis of neck: Secondary | ICD-10-CM | POA: Diagnosis present

## 2021-03-16 DIAGNOSIS — D75839 Thrombocytosis, unspecified: Secondary | ICD-10-CM | POA: Diagnosis present

## 2021-03-16 DIAGNOSIS — E861 Hypovolemia: Secondary | ICD-10-CM | POA: Diagnosis present

## 2021-03-16 DIAGNOSIS — R339 Retention of urine, unspecified: Secondary | ICD-10-CM | POA: Diagnosis present

## 2021-03-16 DIAGNOSIS — E162 Hypoglycemia, unspecified: Secondary | ICD-10-CM | POA: Diagnosis present

## 2021-03-16 DIAGNOSIS — Z781 Physical restraint status: Secondary | ICD-10-CM

## 2021-03-16 DIAGNOSIS — F10931 Alcohol use, unspecified with withdrawal delirium: Secondary | ICD-10-CM

## 2021-03-16 DIAGNOSIS — I10 Essential (primary) hypertension: Secondary | ICD-10-CM | POA: Diagnosis present

## 2021-03-16 DIAGNOSIS — N179 Acute kidney failure, unspecified: Secondary | ICD-10-CM | POA: Diagnosis present

## 2021-03-16 DIAGNOSIS — F10939 Alcohol use, unspecified with withdrawal, unspecified: Secondary | ICD-10-CM | POA: Diagnosis present

## 2021-03-16 DIAGNOSIS — L0390 Cellulitis, unspecified: Secondary | ICD-10-CM | POA: Diagnosis present

## 2021-03-16 DIAGNOSIS — E46 Unspecified protein-calorie malnutrition: Secondary | ICD-10-CM | POA: Diagnosis present

## 2021-03-16 DIAGNOSIS — Z9114 Patient's other noncompliance with medication regimen: Secondary | ICD-10-CM

## 2021-03-16 DIAGNOSIS — E871 Hypo-osmolality and hyponatremia: Secondary | ICD-10-CM | POA: Diagnosis present

## 2021-03-16 DIAGNOSIS — D539 Nutritional anemia, unspecified: Secondary | ICD-10-CM | POA: Diagnosis present

## 2021-03-16 DIAGNOSIS — E876 Hypokalemia: Secondary | ICD-10-CM | POA: Diagnosis present

## 2021-03-16 DIAGNOSIS — Z20822 Contact with and (suspected) exposure to covid-19: Secondary | ICD-10-CM | POA: Diagnosis present

## 2021-03-16 LAB — COMPREHENSIVE METABOLIC PANEL
ALT: <5 U/L (ref 0–44)
AST: <5 U/L — ABNORMAL LOW (ref 15–41)
Albumin: 2.3 g/dL — ABNORMAL LOW (ref 3.5–5.0)
Alkaline Phosphatase: 438 U/L — ABNORMAL HIGH (ref 38–126)
Anion gap: 23 — ABNORMAL HIGH (ref 5–15)
BUN: 9 mg/dL (ref 6–20)
CO2: 27 mmol/L (ref 22–32)
Calcium: 6.6 mg/dL — ABNORMAL LOW (ref 8.9–10.3)
Chloride: 72 mmol/L — ABNORMAL LOW (ref 98–111)
Creatinine, Ser: 1.01 mg/dL — ABNORMAL HIGH (ref 0.44–1.00)
GFR, Estimated: >60 mL/min (ref >60)
Glucose, Bld: 81 mg/dL (ref 70–99)
Potassium: 2.3 mmol/L — CL (ref 3.5–5.1)
Sodium: 122 mmol/L — ABNORMAL LOW (ref 135–145)
Total Bilirubin: 5.9 mg/dL — ABNORMAL HIGH (ref 0.3–1.2)
Total Protein: 5.1 g/dL — ABNORMAL LOW (ref 6.5–8.1)

## 2021-03-16 LAB — CBG MONITORING, ED
Glucose-Capillary: 219 mg/dL — ABNORMAL HIGH (ref 70–99)
Glucose-Capillary: 34 mg/dL — CL (ref 70–99)

## 2021-03-16 LAB — RESP PANEL BY RT-PCR (FLU A&B, COVID) ARPGX2
Influenza A by PCR: NEGATIVE
Influenza B by PCR: NEGATIVE
SARS Coronavirus 2 by RT PCR: NEGATIVE

## 2021-03-16 LAB — BASIC METABOLIC PANEL
Anion gap: 18 — ABNORMAL HIGH (ref 5–15)
BUN: 11 mg/dL (ref 6–20)
CO2: 21 mmol/L — ABNORMAL LOW (ref 22–32)
Calcium: 5.4 mg/dL — CL (ref 8.9–10.3)
Chloride: 86 mmol/L — ABNORMAL LOW (ref 98–111)
Creatinine, Ser: 0.94 mg/dL (ref 0.44–1.00)
GFR, Estimated: >60 mL/min (ref >60)
Glucose, Bld: 69 mg/dL — ABNORMAL LOW (ref 70–99)
Potassium: 2.2 mmol/L — CL (ref 3.5–5.1)
Sodium: 125 mmol/L — ABNORMAL LOW (ref 135–145)

## 2021-03-16 LAB — CBC WITH DIFFERENTIAL/PLATELET
Abs Immature Granulocytes: 0.12 10*3/uL — ABNORMAL HIGH (ref 0.00–0.07)
Basophils Absolute: 0 10*3/uL (ref 0.0–0.1)
Basophils Relative: 0 %
Eosinophils Absolute: 0 10*3/uL (ref 0.0–0.5)
Eosinophils Relative: 0 %
HCT: 27.1 % — ABNORMAL LOW (ref 36.0–46.0)
Hemoglobin: 9.8 g/dL — ABNORMAL LOW (ref 12.0–15.0)
Immature Granulocytes: 1 %
Lymphocytes Relative: 4 %
Lymphs Abs: 0.9 10*3/uL (ref 0.7–4.0)
MCH: 40.5 pg — ABNORMAL HIGH (ref 26.0–34.0)
MCHC: 36.2 g/dL — ABNORMAL HIGH (ref 30.0–36.0)
MCV: 112 fL — ABNORMAL HIGH (ref 80.0–100.0)
Monocytes Absolute: 0.5 10*3/uL (ref 0.1–1.0)
Monocytes Relative: 3 %
Neutro Abs: 19 10*3/uL — ABNORMAL HIGH (ref 1.7–7.7)
Neutrophils Relative %: 92 %
Platelets: 338 10*3/uL (ref 150–400)
RBC: 2.42 MIL/uL — ABNORMAL LOW (ref 3.87–5.11)
RDW: 21.2 % — ABNORMAL HIGH (ref 11.5–15.5)
Smear Review: ADEQUATE
WBC: 20.6 10*3/uL — ABNORMAL HIGH (ref 4.0–10.5)
nRBC: 0.3 % — ABNORMAL HIGH (ref 0.0–0.2)

## 2021-03-16 LAB — I-STAT BETA HCG BLOOD, ED (MC, WL, AP ONLY): I-stat hCG, quantitative: 6.9 m[IU]/mL — ABNORMAL HIGH (ref <5)

## 2021-03-16 LAB — ETHANOL: Alcohol, Ethyl (B): <10 mg/dL (ref <10)

## 2021-03-16 LAB — FOLATE: Folate: 1.8 ng/mL — ABNORMAL LOW (ref >5.9)

## 2021-03-16 LAB — SALICYLATE LEVEL: Salicylate Lvl: <7 mg/dL — ABNORMAL LOW (ref 7.0–30.0)

## 2021-03-16 LAB — ACETAMINOPHEN LEVEL: Acetaminophen (Tylenol), Serum: <10 ug/mL — ABNORMAL LOW (ref 10–30)

## 2021-03-16 LAB — HIV ANTIBODY (ROUTINE TESTING W REFLEX): HIV Screen 4th Generation wRfx: NONREACTIVE

## 2021-03-16 LAB — MAGNESIUM: Magnesium: 0.8 mg/dL — CL (ref 1.7–2.4)

## 2021-03-16 LAB — VITAMIN B12: Vitamin B-12: 898 pg/mL (ref 180–914)

## 2021-03-16 LAB — TSH: TSH: 1.196 u[IU]/mL (ref 0.350–4.500)

## 2021-03-16 LAB — AMMONIA: Ammonia: 107 umol/L — ABNORMAL HIGH (ref 9–35)

## 2021-03-16 LAB — OSMOLALITY: Osmolality: 258 mOsm/kg — ABNORMAL LOW (ref 275–295)

## 2021-03-16 LAB — PHOSPHORUS: Phosphorus: 4.9 mg/dL — ABNORMAL HIGH (ref 2.5–4.6)

## 2021-03-16 MED ORDER — LORAZEPAM 1 MG PO TABS
1.0000 mg | ORAL_TABLET | ORAL | Status: DC | PRN
Start: 2021-03-16 — End: 2021-03-26

## 2021-03-16 MED ORDER — ENOXAPARIN SODIUM 40 MG/0.4ML IJ SOSY
40.0000 mg | PREFILLED_SYRINGE | INTRAMUSCULAR | Status: DC
  Filled 2021-03-16: qty 0.4

## 2021-03-16 MED ORDER — LORAZEPAM 2 MG/ML IJ SOLN
1.0000 mg | INTRAMUSCULAR | Status: DC | PRN
  Administered 2021-03-16: 3 mg via INTRAVENOUS
  Administered 2021-03-16: 2 mg via INTRAVENOUS
  Administered 2021-03-18 – 2021-03-19 (×2): 1 mg via INTRAVENOUS
  Administered 2021-03-19 (×2): 2 mg via INTRAVENOUS
  Administered 2021-03-20 – 2021-03-21 (×2): 4 mg via INTRAVENOUS
  Administered 2021-03-21 – 2021-03-23 (×3): 2 mg via INTRAVENOUS
  Filled 2021-03-16: qty 2
  Filled 2021-03-16 (×5): qty 1
  Filled 2021-03-16 (×3): qty 2
  Filled 2021-03-16 (×4): qty 1

## 2021-03-16 MED ORDER — LORAZEPAM 2 MG/ML IJ SOLN: 1.0000 mg | Freq: Once | INTRAMUSCULAR | Status: AC

## 2021-03-16 MED ORDER — SODIUM CHLORIDE 0.9 % IV SOLN: INTRAVENOUS | Status: AC

## 2021-03-16 MED ORDER — ZIPRASIDONE MESYLATE 20 MG IM SOLR
20.0000 mg | Freq: Once | INTRAMUSCULAR | Status: AC
Start: 2021-03-16 — End: 2021-03-16
  Administered 2021-03-16: 20 mg via INTRAMUSCULAR
  Filled 2021-03-16: qty 20

## 2021-03-16 MED ORDER — DEXMEDETOMIDINE HCL IN NACL 400 MCG/100ML IV SOLN
0.2000 ug/kg/h | INTRAVENOUS | Status: DC
  Administered 2021-03-16: 0.2 ug/kg/h via INTRAVENOUS
  Administered 2021-03-17: 0.7 ug/kg/h via INTRAVENOUS
  Administered 2021-03-17: 0.3 ug/kg/h via INTRAVENOUS
  Administered 2021-03-18 (×2): 0.7 ug/kg/h via INTRAVENOUS
  Administered 2021-03-19: 0.5 ug/kg/h via INTRAVENOUS
  Administered 2021-03-19: 0.7 ug/kg/h via INTRAVENOUS
  Filled 2021-03-16 (×8): qty 100

## 2021-03-16 MED ORDER — SODIUM CHLORIDE 0.9 % IV SOLN
260.0000 mg | INTRAVENOUS | Status: DC | PRN
  Filled 2021-03-16: qty 2

## 2021-03-16 MED ORDER — POTASSIUM CHLORIDE 10 MEQ/100ML IV SOLN
10.0000 meq | INTRAVENOUS | Status: AC
  Administered 2021-03-16 – 2021-03-17 (×3): 10 meq via INTRAVENOUS
  Filled 2021-03-16 (×3): qty 100

## 2021-03-16 MED ORDER — ADULT MULTIVITAMIN W/MINERALS CH
1.0000 | ORAL_TABLET | Freq: Every day | ORAL | Status: DC
  Administered 2021-03-19 – 2021-03-26 (×8): 1 via ORAL
  Filled 2021-03-16 (×9): qty 1

## 2021-03-16 MED ORDER — DEXTROSE 50 % IV SOLN: 1.0000 | Freq: Once | INTRAVENOUS | Status: AC

## 2021-03-16 MED ORDER — LORAZEPAM 2 MG/ML IJ SOLN: 1.0000 mg | INTRAMUSCULAR | Status: DC | PRN

## 2021-03-16 MED ORDER — HALOPERIDOL LACTATE 5 MG/ML IJ SOLN
1.0000 mg | Freq: Once | INTRAMUSCULAR | Status: AC
  Administered 2021-03-16: 1 mg via INTRAVENOUS
  Filled 2021-03-16: qty 1

## 2021-03-16 MED ORDER — SODIUM CHLORIDE 0.9 % IV BOLUS (SEPSIS)
1000.0000 mL | Freq: Once | INTRAVENOUS | Status: AC
  Administered 2021-03-16: 1000 mL via INTRAVENOUS

## 2021-03-16 MED ORDER — DEXTROSE-NACL 10-0.45 % IV SOLN
INTRAVENOUS | Status: DC
Start: 2021-03-16 — End: 2021-03-16
  Filled 2021-03-16 (×2): qty 1000

## 2021-03-16 MED ORDER — DOCUSATE SODIUM 100 MG PO CAPS: 100.0000 mg | ORAL_CAPSULE | Freq: Two times a day (BID) | ORAL | Status: DC | PRN

## 2021-03-16 MED ORDER — DEXMEDETOMIDINE HCL IN NACL 400 MCG/100ML IV SOLN: 0.2000 ug/kg/h | INTRAVENOUS | Status: DC

## 2021-03-16 MED ORDER — LORAZEPAM 1 MG PO TABS: 1.0000 mg | ORAL_TABLET | ORAL | Status: DC | PRN

## 2021-03-16 MED ORDER — THIAMINE HCL 100 MG PO TABS
100.0000 mg | ORAL_TABLET | Freq: Every day | ORAL | Status: DC
  Filled 2021-03-16: qty 1

## 2021-03-16 MED ORDER — PHENOBARBITAL SODIUM 130 MG/ML IJ SOLN: 130.0000 mg | INTRAMUSCULAR | Status: DC | PRN

## 2021-03-16 MED ORDER — FOLIC ACID 1 MG PO TABS
1.0000 mg | ORAL_TABLET | Freq: Every day | ORAL | Status: DC
  Administered 2021-03-18 – 2021-03-26 (×9): 1 mg via ORAL
  Filled 2021-03-16 (×9): qty 1

## 2021-03-16 MED ORDER — HEPARIN SODIUM (PORCINE) 5000 UNIT/ML IJ SOLN
5000.0000 [IU] | Freq: Three times a day (TID) | INTRAMUSCULAR | Status: DC
  Administered 2021-03-17 – 2021-03-24 (×22): 5000 [IU] via SUBCUTANEOUS
  Filled 2021-03-16 (×22): qty 1

## 2021-03-16 MED ORDER — LORAZEPAM 2 MG/ML IJ SOLN
INTRAMUSCULAR | Status: AC
  Administered 2021-03-16: 1 mg via INTRAVENOUS
  Filled 2021-03-16: qty 1

## 2021-03-16 MED ORDER — THIAMINE HCL 100 MG/ML IJ SOLN
100.0000 mg | Freq: Every day | INTRAMUSCULAR | Status: DC
  Administered 2021-03-16 – 2021-03-21 (×6): 100 mg via INTRAVENOUS
  Filled 2021-03-16 (×6): qty 2

## 2021-03-16 MED ORDER — POTASSIUM CHLORIDE 10 MEQ/100ML IV SOLN
10.0000 meq | Freq: Once | INTRAVENOUS | Status: AC
  Administered 2021-03-16: 10 meq via INTRAVENOUS
  Filled 2021-03-16: qty 100

## 2021-03-16 MED ORDER — POTASSIUM CHLORIDE CRYS ER 20 MEQ PO TBCR
40.0000 meq | EXTENDED_RELEASE_TABLET | Freq: Once | ORAL | Status: AC
  Administered 2021-03-16: 40 meq via ORAL
  Filled 2021-03-16: qty 2

## 2021-03-16 MED ORDER — DEXTROSE 50 % IV SOLN
INTRAVENOUS | Status: AC
  Administered 2021-03-16: 50 mL via INTRAVENOUS
  Filled 2021-03-16: qty 50

## 2021-03-16 MED ORDER — POLYETHYLENE GLYCOL 3350 17 G PO PACK: 17.0000 g | PACK | Freq: Every day | ORAL | Status: DC | PRN

## 2021-03-16 NOTE — ED Notes (Signed)
Pt asked to go to the bathroom to wash her hands, while next to the trash can the pt attempted to take out her IV. This technician was able to stop the patient before pulling it fully out. Pt then demanded to use the bathroom. While standing to pivot to the toilet, pt dropped directly onto her knees. While there, the pt attempted to wash her hands with this technician's hair. Pt was able to get up with the assistance of 3 others. Pt is now back in the hallway, feeding imaginary pigeons.

## 2021-03-16 NOTE — H&P (Addendum)
Date: 03/16/2021               Patient Name:  Stacey Wong MRN: [id number]  DOB: 09-16-1980 Age / Sex: 40 y.o., female   PCP: Patient, No Pcp Per (Inactive)         Medical Service: Internal Medicine Teaching Service         Attending Physician: Dr. Sid Falcon, MD    First Contact: Dr. Corky Wong  Pager: [phone]  Second Contact: Dr. Gaylan Wong  Pager: [phone]       After Hours (After 5p/  First Contact Pager: [phone]  weekends / holidays): Second Contact Pager: [phone]   Chief Complaint: acute encephalopathy  History of Present Illness:   Stacey Wong is a 40 yo female with PMH of alcohol use disorder, bipolar disorder, hip dysplasia, who presented to the ED for acute encephalopathy.   Patient was seen at bedside. She appears anxious and fidgeting. She was trying to pull out her IV and pulse ox. Patient was oriented to person but not to place and time. Patient denies visual or auditory hallucination however started talking with incomprehensible speech to unseen other.  She is also constantly getting out of bed and is not redirectable with verbal stimulation.  Patient was diaphoretic and tachycardic.  History was obtained from patient's mother and husband.  Patient does not live with her mom but with her husband of 8 years.  They state that all her symptoms started this morning with hallucination, ataxia and acute encephalopathy.  She drinks about a fifth of liquor a day for the last 2 years.  Also report poor p.o. intake while drinking.  Patient husband states that she stopped drinking 2 days ago because of not feeling well.  There was a family member that contracted [place] recently and they thought they may have had [place].  Patient's mom states that patient has never been in alcohol withdrawal or hospitalized for it.  Patient have history of behavioral health with bipolar and schizoaffective disorder.  She was on medication but has stopped 4 years ago.  She does not see  any PCP.  In the ED, blood pressure was on the lower side at 110/97.  Tachycardic at 113 and satting well on room air.  CBC remarkable for leukocytosis of 20.6, neutrophil predominance, and hemoglobin of 9.8, MCV 112.  CMP showed moderate hyponatremia of 122, severe hypokalemia of 2.3.  T head was negative  Meds:  Current Meds  Medication Sig   diclofenac Sodium (VOLTAREN) 1 % GEL Apply 2 g topically daily as needed (For feet pain).     Allergies: Allergies as of 03/16/2021   (No Known Allergies)   Past Medical History:  Diagnosis Date   Asthma    Bipolar 1 disorder ([place])    Hypertension     Family History:  Grandmother has agoraphobia  Social History:  -Drink a fifth of liquor a day for 2 years -Does not smoke -Patient's husband states that she occasionally obtained pain medication from the street for hip pain  Review of Systems: A complete ROS was negative except as per HPI.   Physical Exam: Blood pressure 120/74, pulse (!) 110, temperature (!) 97.3 F (36.3 C), temperature source Oral, resp. rate 19, SpO2 94 %. Physical Exam Constitutional:      General: She is in acute distress.     Appearance: She is ill-appearing, toxic-appearing and diaphoretic.     Comments: Patient was agitated, anxious and  fidgeting.  She exhibited hallucination by talking to unseen others with incomprehensible speech.  She also constantly trying to take off her IV and pulse ox and climbing out of bed.  She is not redirectable.  Patient is able to maintain airway.  HENT:     Head: Normocephalic.     Comments: A linear wound seen on the right temple run down to the mandible, not bleeding.    Mouth/Throat:     Mouth: Mucous membranes are dry.  Eyes:     General:        Right eye: No discharge.        Left eye: No discharge.     Conjunctiva/sclera: Conjunctivae normal.  Cardiovascular:     Rate and Rhythm: Regular rhythm. Tachycardia present.     Heart sounds: Normal heart sounds.   Pulmonary:     Effort: Pulmonary effort is normal. No respiratory distress.     Breath sounds: No wheezing.  Abdominal:     General: Bowel sounds are normal.     Palpations: Abdomen is soft.     Tenderness: There is no abdominal tenderness. There is no guarding.     Comments: No ascites  Musculoskeletal:        General: Normal range of motion.     Cervical back: Normal range of motion.  Skin:    General: Skin is warm.     Coloration: Skin is not jaundiced.  Neurological:     Mental Status: She is alert. She is disoriented.   Assessment & Plan by Problem: Principal Problem:   Alcohol withdrawal (HCC)  Stacey Wong is a 40 yo female with PMH of alcohol use disorder, bipolar disorder, hip dysplasia, who presented to the ED for acute encephalopathy secondary to acute alcohol withdrawal  Alcohol withdrawal Patient has long history of heavy alcohol use and stopped 2 days ago to not feeling well.  This precipitated her alcohol withdrawal with hallucination, confusion, agitation, and tachycardia.  Patient received 2 mg Ativan without much relief in the ED.  Restrained was placed for patient's safety.  Permission was obtained from her mother. I am concerned about patient's clinical status and her severe alcohol withdrawal.  She is within the range of DT.  PCCM was consulted for possible ICU transfer for Precedex drip.  They will see the patient tonight. -Continue CIWA with Ativan -Haldol and Geodon per CCM -N.p.o. -IV normal saline for hydration -Thiamine and folate supplement -Will obtain right upper quadrant ultrasound when patient is more stable to assess her liver  Moderate hyponatremia Likely Beer potomania in the setting of excessive alcohol use and poor p.o. intake for a long time.  Patient currently n.p.o. at this time.  Will start IV normal saline for hydration. -BMP every 6 hour.  -Goal correction sodium 4 - 6 in 24 hours -Pending serum osm, urine osm and urine sodium to  confirm  Hypokalemia Likely due to poor p.o. intake. -Replete with IV potassium -Follow-up on BMP -Check mag and Phos.  Replete if needed  Hypoglycemia Presenting with CBG of 34.  Improved to 219 after being on D10.  Likely secondary to poor p.o. intake. -CBG every 4 hours.  -Will check right upper quadrant ultrasound when she is more stable.  Given her alcohol use, she could have liver dysfunction causing reduction in gluconeogenesis.  Leukocytosis No signs of active infection.  Likely reactive in the setting of alcohol withdrawal.  Hopefully will get better with IV fluid. -CBC in a.m.  Macrocytic anemia In the setting of alcohol use -Check B12 and folate  Elevated beta-hCG -Will recheck in a.m. to confirm  Bipolar disorder Per chart review, she was on Zyprexa, Lamictal and Prozac.  She has not been on medication for 4 years. -Follow-up with behavioral health when stable  Full code IVF: NS DVT: Lovenox Diet: N.p.o.  Dispo: Admit patient to Inpatient with expected length of stay greater than 2 midnights.  Signed: Gaylan Gerold, DO 03/16/2021, 8:07 PM  Pager: [phone] After 5pm on weekdays and 1pm on weekends: On Call pager: [phone]

## 2021-03-16 NOTE — ED Notes (Signed)
MD aware of pt behavior and concerns for alcohol withdrawal, mother at bedside and admitting MD now at bedside

## 2021-03-16 NOTE — ED Provider Notes (Signed)
[facility] EMERGENCY DEPARTMENT Provider Note   CSN: [id number] Arrival date & time: 03/16/21  1242     History Chief Complaint  Patient presents with   Hypoglycemia   behavioral issue    Stacey Wong is a 40 y.o. female.  Patient has a history of bipolar and alcohol abuse.  She is brought into the emergency department for confusion.  According to her husband she has been drinking a lot of alcohol and just passing out and drinking more alcohol and passing out but last 2 days she has not had any alcohol  The history is provided by the patient and a relative. No language interpreter was used.  Altered Mental Status Presenting symptoms: behavior changes   Severity:  Moderate Most recent episode:  Today Episode history:  Continuous Timing:  Constant Progression:  Waxing and waning Chronicity:  Recurrent Context: alcohol use   Associated symptoms: no abdominal pain       Past Medical History:  Diagnosis Date   Asthma    Bipolar 1 disorder ([place])    Hypertension     Patient Active Problem List   Diagnosis Date Noted   Chronic right hip pain 03/30/2018   Trochanteric bursitis 05/27/2015   Encounter for chronic pain management 06/16/2014   Healthcare maintenance 11/10/2013   Ganglion cyst 06/16/2013   Numbness and tingling in hands 01/16/2013   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     History reviewed. No pertinent family history.  Social History   Tobacco Use   Smoking status: Every Day     Packs/day: 1.00    Types: Cigarettes   Smokeless tobacco: Never   Tobacco comments:    decreased smoking  Substance Use Topics   Alcohol use: Yes    Alcohol/week: 0.0 standard drinks    Comment: occasionally   Drug use: No    Home Medications Prior to Admission medications   Medication Sig Start Date End Date Taking? Authorizing Provider  diclofenac Sodium (VOLTAREN) 1 % GEL Apply 2 g topically daily as needed (For feet pain).   Yes [provider]  potassium chloride SA (K-DUR) 20 MEQ tablet Take 1 tablet (20 mEq total) by mouth once for 1 dose. Patient not taking: Reported on 03/16/2021 11/16/18 11/16/18  Johnn Hai, PA-C    Allergies    Patient has no known allergies.  Review of Systems   Review of Systems  Unable to perform ROS: Mental status change  Gastrointestinal:  Negative for abdominal pain.   Physical Exam Updated Vital Signs BP (!) 97/56   Pulse (!) 113   Resp (!) 24   SpO2 100%   Physical Exam Vitals and nursing note reviewed.  Constitutional:      Appearance: She is well-developed.     Comments: Patient in moderate distress and very anxious  HENT:     Head: Normocephalic.     Nose: Nose normal.  Eyes:     General: No scleral icterus.  Conjunctiva/sclera: Conjunctivae normal.  Neck:     Thyroid: No thyromegaly.  Cardiovascular:     Rate and Rhythm: Normal rate and regular rhythm.     Heart sounds: No murmur heard.   No friction rub. No gallop.  Pulmonary:     Breath sounds: No stridor. No wheezing or rales.  Chest:     Chest wall: No tenderness.  Abdominal:     General: There is no distension.     Tenderness: There is no abdominal tenderness. There is no rebound.  Musculoskeletal:        General: Normal range of motion.     Cervical back: Neck supple.  Lymphadenopathy:     Cervical: No cervical adenopathy.  Skin:    Findings: No erythema or rash.  Neurological:     Mental Status: She is alert.     Motor: No abnormal  muscle tone.     Coordination: Coordination normal.     Comments: Patient oriented to person and place  Psychiatric:     Comments: Patient is very anxious and paranoid    ED Results / Procedures / Treatments   Labs (all labs ordered are listed, but only abnormal results are displayed) Labs Reviewed  COMPREHENSIVE METABOLIC PANEL - Abnormal; Notable for the following components:      Result Value   Sodium 122 (*)    Potassium 2.3 (*)    Chloride 72 (*)    Creatinine, Ser 1.01 (*)    Calcium 6.6 (*)    Total Protein 5.1 (*)    Albumin 2.3 (*)    AST <5 (*)    Alkaline Phosphatase 438 (*)    Total Bilirubin 5.9 (*)    Anion gap 23 (*)    All other components within normal limits  CBC WITH DIFFERENTIAL/PLATELET - Abnormal; Notable for the following components:   WBC 20.6 (*)    RBC 2.42 (*)    Hemoglobin 9.8 (*)    HCT 27.1 (*)    MCV 112.0 (*)    MCH 40.5 (*)    MCHC 36.2 (*)    RDW 21.2 (*)    nRBC 0.3 (*)    Neutro Abs 19.0 (*)    Abs Immature Granulocytes 0.12 (*)    All other components within normal limits  AMMONIA - Abnormal; Notable for the following components:   Ammonia 107 (*)    All other components within normal limits  I-STAT BETA HCG BLOOD, ED (MC, WL, AP ONLY) - Abnormal; Notable for the following components:   I-stat hCG, quantitative 6.9 (*)    All other components within normal limits  CBG MONITORING, ED - Abnormal; Notable for the following components:   Glucose-Capillary 34 (*)    All other components within normal limits  CBG MONITORING, ED - Abnormal; Notable for the following components:   Glucose-Capillary 219 (*)    All other components within normal limits  TSH  RAPID URINE DRUG SCREEN, HOSP PERFORMED  URINALYSIS, ROUTINE W REFLEX MICROSCOPIC  ACETAMINOPHEN LEVEL  ETHANOL  SALICYLATE LEVEL    EKG None  Radiology CT HEAD WO CONTRAST (5MM)  Result Date: 03/16/2021 CLINICAL DATA:  Mental status change EXAM: CT HEAD WITHOUT CONTRAST  TECHNIQUE: Contiguous axial images were obtained from the base of the skull through the vertex without intravenous contrast. COMPARISON:  None. FINDINGS: Brain: No evidence of acute infarction, hemorrhage, hydrocephalus, extra-axial collection or mass lesion/mass effect. Vascular: No hyperdense vessel or unexpected calcification. Skull: Normal. Negative for fracture  or focal lesion. Sinuses/Orbits: No acute finding. Other: None. IMPRESSION: No acute intracranial abnormality. Electronically Signed   By: Yetta Glassman M.D.   On: 03/16/2021 13:43    Procedures Procedures   Medications Ordered in ED Medications  sodium chloride 0.9 % bolus 1,000 mL (has no administration in time range)  dextrose 10 % and 0.45 % NaCl infusion (has no administration in time range)  potassium chloride 10 mEq in 100 mL IVPB (has no administration in time range)  potassium chloride SA (KLOR-CON) CR tablet 40 mEq (has no administration in time range)  dextrose 50 % solution 50 mL (50 mLs Intravenous Given 03/16/21 1654)    ED Course  I have reviewed the triage vital signs and the nursing notes.  Pertinent labs & imaging results that were available during my care of the patient were reviewed by me and considered in my medical decision making (see chart for details). CRITICAL CARE Performed by: Milton Ferguson Total critical care time: 40 minutes Critical care time was exclusive of separately billable procedures and treating other patients. Critical care was necessary to treat or prevent imminent or life-threatening deterioration. Critical care was time spent personally by me on the following activities: development of treatment plan with patient and/or surrogate as well as nursing, discussions with consultants, evaluation of patient's response to treatment, examination of patient, obtaining history from patient or surrogate, ordering and performing treatments and interventions, ordering and review of laboratory studies,  ordering and review of radiographic studies, pulse oximetry and re-evaluation of patient's condition. Patient had episode of hypoglycemia prior to admission.   MDM Rules/Calculators/A&P                           Patient with hypoglycemic by EMS.  And then she had another episode of hypoglycemia with a sugar of 34 here.  She is hyponatremic along with liver disease and elevated ammonia level.  She will be admitted to medicine for electrolyte abnormalities and liver disease and altered mental status Final Clinical Impression(s) / ED Diagnoses Final diagnoses:  None    Rx / DC Orders ED Discharge Orders     None        Milton Ferguson, MD 03/16/21 Johnnye Lana

## 2021-03-16 NOTE — ED Triage Notes (Signed)
Patient BIB [facility]. Picked her up for hypoglycemia 60s, gave pt D10. Also for behavioral, pt is off medications.

## 2021-03-16 NOTE — Consult Note (Signed)
NAME:  Stacey Wong, MRN:  [id number], DOB:  1980/08/02, LOS: 0 ADMISSION DATE:  03/16/2021 CONSULTATION DATE:  03/16/2021 REFERRING MD:  Graciella Freer CHIEF COMPLAINT:  EtOH Withdrawal   History of Present Illness:  [name] is seen in consultation at the request of IMTS for recommendations on further evaluation and management of AMS and presumed EtOH withdrawal.  40 year old woman who presented to [facility] ED 11/18 via EMS for hypoglycemia, AMS and concern for EtOH withdrawal. PMHx significant for HTN, asthma, BPD (not on medications), hip dysplasia with associated chronic pain and EtOH abuse. Per patient's mother (at bedside), she has been "self-medicating" her pain with alcohol for many years; stopped for a short time a few years ago but resumed drinking heavily in 2020. Per mother, patient drinks about a gallon of brown liquor over three days. She had not drank alcohol in a few days due to not feeling well and began to have AMS with hallucinations, prompting EMS contact.  On EMS arrival, was hypoglycemic to 60s and hallucinating. D10 was administered with improvement in CBG to 200s; however; on ED arrival patient was again hypoglycemic to 30s.She was reportedly biting her hands and unable to follow commands from ED staff. Ongoing combativeness and hallucinations in the setting of likely EtOH withdrawal prompting initiation of CIWA protocol (Ativan). Additionally, Haldol and Geodon were administered and soft restraints were used. Patient had multiple lab abnormalities including electrolyte derangements with Na 122, K 2.3; mild AKI with Cr 1.01 (baseline 0.6) and WBC 20K. Of note, quantitative blood beta HCG was low positive (6.9) and this was repeated. CT Head was negative for acute intracranial abnormalities.   As patient began to wake, she became hypertensive and remained confused/combative prompting PCCM consult/ICU admission for Precedex initiation.  Pertinent Medical History:   Past Medical History:   Diagnosis Date   Asthma    Bipolar 1 disorder ([place])    Hypertension   Hip dysplasia Chronic pain EtOH abuse  Significant Hospital Events: Including procedures, antibiotic start and stop dates in addition to other pertinent events   11/18 - Presented to ED for AMS, hypoglycemia and c/f EtOH withdrawal. Admitted to ICU for Precedex/CIWA protocol.  Interim History / Subjective:  PCCM consulted for ICU admission and management  Objective:  Blood pressure 120/74, pulse (!) 110, temperature (!) 97.3 F (36.3 C), temperature source Oral, resp. rate 19, SpO2 94 %.       No intake or output data in the 24 hours ending 03/16/21 2046 There were no vitals filed for this visit.  Physical Examination: General: Acute-on-chronically ill-appearing woman in NAD. Drowsy. HEENT: Minimally icteric sclera, pupils round and equal 11mm, sluggishly reactive, dry mucous membranes. Large area of deep scabbing 2/2 repeated excoriation just anterior to R ear/neck (see Media tab). Neuro: Lethargic. Responds to tactile stimuli. Not following commands. Moves all 4 extremities spontaneously. Strength 5/5 in all 4 extremities. CV: Tachycardic, regular rhythm, no m/g/r. PULM: Breathing even and unlabored on RA. Lung fields CTAB. GI: Soft, nontender, minimally distended. Normoactive bowel sounds. Extremities: Trace symmetric BLE edema noted. Skin: Warm/dry, multiple areas of ecchymosis to bilateral knees, bilateral forearms, R great toe. Small dime-sized open sore to medial R ankle.  Resolved Hospital Problem List:    Assessment & Plan:  [name] is seen in consultation at the request of IMTS for recommendations on further evaluation and management of AMS and presumed EtOH withdrawal.  Acute EtOH withdrawal Toxic metabolic encephalopathy - Admit to ICU for close monitoring -  Goal RASS 0 to -1 - Precedex gtt - CIWA protocol; Ativan for now, would transition to phenobarbital if repeat B-hcg unremarkable -  Thiamine, folate, MV - Correct electrolyte derangements as below  Hypoglycemia - CBGs Q4H - D50 PRN - Hypoglycemia protocol in place  Mild AKI Hyponatremia Hypokalemia Hypomagnesemia - Trend BMP Q6H - Fluid resuscitation as appropriate - Replete electrolytes as indicated - F/u urine studies (urine osm, urine Na) - Monitor I&Os - Avoid nephrotoxic agents as able - Ensure adequate renal perfusion  Hip dysplasia Chronic pain EtOH abuse History of chronic hip dysplasia per mother; self-medicates with EtOH. - Pain management - Encourage EtOH cessation/provide resources once able to participate in care  Bipolar disorder Not on any medications. - Consider Psychiatry consult while in-house  Best Practice: (right click and "Reselect all SmartList Selections" daily)   Diet/type: NPO DVT prophylaxis: prophylactic heparin  GI prophylaxis: N/A Lines: N/A Foley:  N/A Code Status:  full code Last date of multidisciplinary goals of care discussion [Pending]  Labs:  CBC: Recent Labs  Lab 03/16/21 1249  WBC 20.6*  NEUTROABS 19.0*  HGB 9.8*  HCT 27.1*  MCV 112.0*  PLT 182   Basic Metabolic Panel: Recent Labs  Lab 03/16/21 1249  NA 122*  K 2.3*  CL 72*  CO2 27  GLUCOSE 81  BUN 9  CREATININE 1.01*  CALCIUM 6.6*   GFR: CrCl cannot be calculated (Unknown ideal weight.). Recent Labs  Lab 03/16/21 1249  WBC 20.6*   Liver Function Tests: Recent Labs  Lab 03/16/21 1249  AST <5*  ALT <5  ALKPHOS 438*  BILITOT 5.9*  PROT 5.1*  ALBUMIN 2.3*   No results for input(s): LIPASE, AMYLASE in the last 168 hours. Recent Labs  Lab 03/16/21 1249  AMMONIA 107*   ABG:    Component Value Date/Time   TCO2 24 03/31/2011 1335    Coagulation Profile: No results for input(s): INR, PROTIME in the last 168 hours.  Cardiac Enzymes: No results for input(s): CKTOTAL, CKMB, CKMBINDEX, TROPONINI in the last 168 hours.  HbA1C: No results found for: HGBA1C  CBG: Recent  Labs  Lab 03/16/21 1639 03/16/21 1706  GLUCAP 34* 219*   Review of Systems:   Patient is encephalopathic and/or intubated. Therefore history has been obtained from mother at bedside/chart review.   Past Medical History:  She,  has a past medical history of Asthma, Bipolar 1 disorder ([place]), and Hypertension.   Surgical History:   Past Surgical History:  Procedure Laterality Date   CHOLECYSTECTOMY      Social History:   reports that she has been smoking cigarettes. She has been smoking an average of 1 pack per day. She has never used smokeless tobacco. She reports current alcohol use. She reports that she does not use drugs.   Family History:  Her family history is not on file.   Allergies: No Known Allergies   Home Medications: Prior to Admission medications   Medication Sig Start Date End Date Taking? Authorizing Provider  diclofenac Sodium (VOLTAREN) 1 % GEL Apply 2 g topically daily as needed (For feet pain).   Yes [provider]  potassium chloride SA (K-DUR) 20 MEQ tablet Take 1 tablet (20 mEq total) by mouth once for 1 dose. Patient not taking: Reported on 03/16/2021 11/16/18 11/16/18  Johnn Hai, PA-C    Critical care time: 45 minutes   Lestine Mount, [facility] 03/16/21 8:46 PM  Please see [email or web address]  for pager details.  From 7A-7P if no response, please call [phone] After hours, please call ELink [phone]

## 2021-03-16 NOTE — ED Provider Notes (Signed)
Emergency Medicine Provider Triage Evaluation Note  Stacey Wong , a 40 y.o. female  was evaluated in triage.  Pt complains of hyperglycemia and hallucinations.  Brought to ED by EMS.  EMS reports that patient had CBG of 60, received D10 with improvement in blood sugar to 200.  While in route to the emergency department patient began hallucinating and biting her hands.  Has a history of bipolar disorder however was taken off medication by her husband.  Patient is alert to person only.  Able to answer questions.  Denies any pain or discomfort.  Denies any suicidal ideations or homicidal ideations.  Endorses hallucinations but is unable to give further information.  Endorses alcohol use.  Review of Systems  Positive: Hypoglycemia, hallucinations Negative: Suicidal ideations, homicidal ideations  Physical Exam  BP 112/76 (BP Location: Left Arm)   Pulse (!) 115   Resp 18   SpO2 100%  Gen:   Awake, no distress   Resp:  Normal effort, lungs clear to auscultation bilaterally. MSK:   Moves extremities without difficulty  Other:  Pupils 4 mm bilaterally and PERRL.  Wound to right side of patient's face inferior to right ear.  Wound to anterior of left ear.  Medical Decision Making  Medically screening exam initiated at 12:49 PM.  Appropriate orders placed.  Stacey Wong was informed that the remainder of the evaluation will be completed by another provider, this initial triage assessment does not replace that evaluation, and the importance of remaining in the ED until their evaluation is complete.  Hypoglycemia, hallucinations.  We will hold patient in triage area until room becomes available.  Will work-up for AMS.   Loni Beckwith, PA-C 03/16/21 1252    Hayden Rasmussen, MD 03/16/21 [phone]

## 2021-03-17 ENCOUNTER — Inpatient Hospital Stay (HOSPITAL_COMMUNITY): Payer: Self-pay

## 2021-03-17 DIAGNOSIS — L0390 Cellulitis, unspecified: Secondary | ICD-10-CM | POA: Diagnosis present

## 2021-03-17 DIAGNOSIS — D539 Nutritional anemia, unspecified: Secondary | ICD-10-CM | POA: Diagnosis present

## 2021-03-17 DIAGNOSIS — E876 Hypokalemia: Secondary | ICD-10-CM | POA: Diagnosis present

## 2021-03-17 DIAGNOSIS — E349 Endocrine disorder, unspecified: Secondary | ICD-10-CM | POA: Diagnosis present

## 2021-03-17 DIAGNOSIS — E7220 Disorder of urea cycle metabolism, unspecified: Secondary | ICD-10-CM | POA: Diagnosis present

## 2021-03-17 DIAGNOSIS — G9341 Metabolic encephalopathy: Secondary | ICD-10-CM

## 2021-03-17 DIAGNOSIS — R7989 Other specified abnormal findings of blood chemistry: Secondary | ICD-10-CM | POA: Diagnosis present

## 2021-03-17 LAB — CBC
HCT: 22.6 % — ABNORMAL LOW (ref 36.0–46.0)
Hemoglobin: 8 g/dL — ABNORMAL LOW (ref 12.0–15.0)
MCH: 40 pg — ABNORMAL HIGH (ref 26.0–34.0)
MCHC: 35.4 g/dL (ref 30.0–36.0)
MCV: 113 fL — ABNORMAL HIGH (ref 80.0–100.0)
Platelets: 338 10*3/uL (ref 150–400)
RBC: 2 MIL/uL — ABNORMAL LOW (ref 3.87–5.11)
RDW: 21.1 % — ABNORMAL HIGH (ref 11.5–15.5)
WBC: 17.1 10*3/uL — ABNORMAL HIGH (ref 4.0–10.5)
nRBC: 0 % (ref 0.0–0.2)

## 2021-03-17 LAB — COMPREHENSIVE METABOLIC PANEL WITH GFR
ALT: 38 U/L (ref 0–44)
AST: 182 U/L — ABNORMAL HIGH (ref 15–41)
Albumin: 1.8 g/dL — ABNORMAL LOW (ref 3.5–5.0)
Alkaline Phosphatase: 292 U/L — ABNORMAL HIGH (ref 38–126)
Anion gap: 17 — ABNORMAL HIGH (ref 5–15)
BUN: 17 mg/dL (ref 6–20)
CO2: 23 mmol/L (ref 22–32)
Calcium: 6 mg/dL — CL (ref 8.9–10.3)
Chloride: 82 mmol/L — ABNORMAL LOW (ref 98–111)
Creatinine, Ser: 0.91 mg/dL (ref 0.44–1.00)
GFR, Estimated: >60 mL/min
Glucose, Bld: 83 mg/dL (ref 70–99)
Potassium: 3.1 mmol/L — ABNORMAL LOW (ref 3.5–5.1)
Sodium: 122 mmol/L — ABNORMAL LOW (ref 135–145)
Total Bilirubin: UNDETERMINED mg/dL (ref 0.3–1.2)
Total Protein: 4 g/dL — ABNORMAL LOW (ref 6.5–8.1)

## 2021-03-17 LAB — BASIC METABOLIC PANEL
Anion gap: 14 (ref 5–15)
BUN: 14 mg/dL (ref 6–20)
CO2: 26 mmol/L (ref 22–32)
Calcium: 5.7 mg/dL — CL (ref 8.9–10.3)
Chloride: 84 mmol/L — ABNORMAL LOW (ref 98–111)
Creatinine, Ser: 0.97 mg/dL (ref 0.44–1.00)
GFR, Estimated: >60 mL/min (ref >60)
Glucose, Bld: 80 mg/dL (ref 70–99)
Potassium: 2.6 mmol/L — CL (ref 3.5–5.1)
Sodium: 124 mmol/L — ABNORMAL LOW (ref 135–145)

## 2021-03-17 LAB — BASIC METABOLIC PANEL WITH GFR
Anion gap: 13 (ref 5–15)
BUN: 14 mg/dL (ref 6–20)
CO2: 25 mmol/L (ref 22–32)
Calcium: 6.3 mg/dL — CL (ref 8.9–10.3)
Chloride: 94 mmol/L — ABNORMAL LOW (ref 98–111)
Creatinine, Ser: 0.64 mg/dL (ref 0.44–1.00)
GFR, Estimated: >60 mL/min
Glucose, Bld: 84 mg/dL (ref 70–99)
Potassium: 3.3 mmol/L — ABNORMAL LOW (ref 3.5–5.1)
Sodium: 132 mmol/L — ABNORMAL LOW (ref 135–145)

## 2021-03-17 LAB — MAGNESIUM: Magnesium: 2.2 mg/dL (ref 1.7–2.4)

## 2021-03-17 LAB — MRSA NEXT GEN BY PCR, NASAL: MRSA by PCR Next Gen: NOT DETECTED

## 2021-03-17 LAB — GLUCOSE, CAPILLARY
Glucose-Capillary: 100 mg/dL — ABNORMAL HIGH (ref 70–99)
Glucose-Capillary: 101 mg/dL — ABNORMAL HIGH (ref 70–99)
Glucose-Capillary: 107 mg/dL — ABNORMAL HIGH (ref 70–99)
Glucose-Capillary: 87 mg/dL (ref 70–99)
Glucose-Capillary: 91 mg/dL (ref 70–99)
Glucose-Capillary: 88 mg/dL (ref 70–99)
Glucose-Capillary: 79 mg/dL (ref 70–99)

## 2021-03-17 LAB — HCG, QUANTITATIVE, PREGNANCY
hCG, Beta Chain, Quant, S: 1 m[IU]/mL (ref <5)
hCG, Beta Chain, Quant, S: 1 m[IU]/mL (ref <5)

## 2021-03-17 LAB — PHOSPHORUS: Phosphorus: 4.5 mg/dL (ref 2.5–4.6)

## 2021-03-17 LAB — CBG MONITORING, ED: Glucose-Capillary: 103 mg/dL — ABNORMAL HIGH (ref 70–99)

## 2021-03-17 LAB — CORTISOL-AM, BLOOD: Cortisol - AM: 28.3 ug/dL — ABNORMAL HIGH (ref 6.7–22.6)

## 2021-03-17 MED ORDER — LACTATED RINGERS IV BOLUS
1000.0000 mL | Freq: Once | INTRAVENOUS | Status: AC
  Administered 2021-03-17: 1000 mL via INTRAVENOUS

## 2021-03-17 MED ORDER — PHENOBARBITAL SODIUM 130 MG/ML IJ SOLN
130.0000 mg | Freq: Three times a day (TID) | INTRAMUSCULAR | Status: AC
  Administered 2021-03-17 – 2021-03-19 (×6): 130 mg via INTRAVENOUS
  Filled 2021-03-17 (×6): qty 1

## 2021-03-17 MED ORDER — CALCIUM GLUCONATE-NACL 1-0.675 GM/50ML-% IV SOLN
1.0000 g | Freq: Once | INTRAVENOUS | Status: AC
  Administered 2021-03-17: 1000 mg via INTRAVENOUS
  Filled 2021-03-17: qty 50

## 2021-03-17 MED ORDER — MAGNESIUM SULFATE 4 GM/100ML IV SOLN
4.0000 g | Freq: Once | INTRAVENOUS | Status: AC
Start: 2021-03-17 — End: 2021-03-17
  Administered 2021-03-17: 4 g via INTRAVENOUS
  Filled 2021-03-17 (×3): qty 100

## 2021-03-17 MED ORDER — POTASSIUM CHLORIDE 10 MEQ/100ML IV SOLN
10.0000 meq | INTRAVENOUS | Status: AC
  Administered 2021-03-17 (×4): 10 meq via INTRAVENOUS
  Filled 2021-03-17 (×4): qty 100

## 2021-03-17 MED ORDER — PIPERACILLIN-TAZOBACTAM 3.375 G IVPB
3.3750 g | Freq: Three times a day (TID) | INTRAVENOUS | Status: DC
  Administered 2021-03-17: 3.375 g via INTRAVENOUS
  Filled 2021-03-17 (×2): qty 50

## 2021-03-17 MED ORDER — POTASSIUM CHLORIDE 10 MEQ/100ML IV SOLN
10.0000 meq | INTRAVENOUS | Status: AC
  Administered 2021-03-17 – 2021-03-18 (×4): 10 meq via INTRAVENOUS
  Filled 2021-03-17 (×4): qty 100

## 2021-03-17 MED ORDER — PHENOBARBITAL SODIUM 65 MG/ML IJ SOLN
30.0000 mg | Freq: Three times a day (TID) | INTRAMUSCULAR | Status: AC
  Administered 2021-03-21 – 2021-03-22 (×6): 30 mg via INTRAVENOUS
  Filled 2021-03-17 (×6): qty 1

## 2021-03-17 MED ORDER — SODIUM CHLORIDE 0.9 % IV SOLN
260.0000 mg | Freq: Once | INTRAVENOUS | Status: AC
  Administered 2021-03-17: 260 mg via INTRAVENOUS
  Filled 2021-03-17: qty 2

## 2021-03-17 MED ORDER — PHENOBARBITAL SODIUM 65 MG/ML IJ SOLN
60.0000 mg | Freq: Three times a day (TID) | INTRAMUSCULAR | Status: AC
  Administered 2021-03-19 – 2021-03-20 (×5): 60 mg via INTRAVENOUS
  Filled 2021-03-17 (×6): qty 1

## 2021-03-17 MED ORDER — CHLORHEXIDINE GLUCONATE CLOTH 2 % EX PADS
6.0000 | MEDICATED_PAD | Freq: Every day | CUTANEOUS | Status: DC
  Administered 2021-03-17 – 2021-03-24 (×8): 6 via TOPICAL

## 2021-03-17 NOTE — Progress Notes (Signed)
eLink Physician-Brief Progress Note Patient Name: Stacey Wong DOB: 10-Jul-1980 MRN: [id number]   Date of Service  03/17/2021  HPI/Events of Note  SBP 80s on Precedex 0.6  eICU Interventions  Suspect sedation-related +/- hypovolemia. Give 1L LR bolus     Intervention Category Major Interventions: Shock - evaluation and management  Natalye Kott [name] 03/17/2021, 6:21 AM

## 2021-03-17 NOTE — Progress Notes (Signed)
eLink Physician-Brief Progress Note Patient Name: MARVALENE BARRETT DOB: [birth year] MRN: [id number]   Date of Service  03/17/2021  HPI/Events of Note  Urinary retention - Patient has already been I/O cathed X 3. Now again has a residual of 539 mL.   eICU Interventions  Plan: Place Foley catheter.     Intervention Category Major Interventions: Other:  Lysle Dingwall 03/17/2021, 11:00 PM

## 2021-03-17 NOTE — Progress Notes (Signed)
[facility] ADULT ICU REPLACEMENT PROTOCOL   The patient does apply for the [facility] Adult ICU Electrolyte Replacment Protocol based on the criteria listed below:   1.Exclusion criteria: TCTS patients, ECMO patients, and Dialysis patients 2. Is GFR >/= 30 ml/min? Yes.    Patient's GFR today is >60 3. Is SCr </= 2? Yes.   Patient's SCr is 0.64 mg/dL 4. Did SCr increase >/= 0.5 in 24 hours? No. 5.Pt's weight >40kg  Yes.   6. Abnormal electrolyte(s): K 3.3  7. Electrolytes replaced per protocol 8.  Call MD STAT for K+ </= 2.5, Phos </= 1, or Mag </= 1 Physician:    Billy Fischer 03/17/2021 8:47 PM

## 2021-03-17 NOTE — Progress Notes (Signed)
Am labs not replaced. Still receiving electrolytes, previously ordered. Due for repeat labs at [place].

## 2021-03-17 NOTE — Consult Note (Signed)
NAME:  Stacey Wong, MRN:  [id number], DOB:  05-31-1980, LOS: 1 ADMISSION DATE:  03/16/2021 CONSULTATION DATE:  03/16/2021 REFERRING MD:  Graciella Freer CHIEF COMPLAINT:  EtOH Withdrawal   History of Present Illness:  Stacey Wong is seen in consultation at the request of IMTS for recommendations on further evaluation and management of AMS and presumed EtOH withdrawal.  40 year old woman who presented to [facility] ED 11/18 via EMS for hypoglycemia, AMS and concern for EtOH withdrawal. PMHx significant for HTN, asthma, BPD (not on medications), hip dysplasia with associated chronic pain and EtOH abuse. Per patient's mother (at bedside), she has been "self-medicating" her pain with alcohol for many years; stopped for a short time a few years ago but resumed drinking heavily in 2020. Per mother, patient drinks about a gallon of brown liquor over three days. She had not drank alcohol in a few days due to not feeling well and began to have AMS with hallucinations, prompting EMS contact.  On EMS arrival, was hypoglycemic to 60s and hallucinating. D10 was administered with improvement in CBG to 200s; however; on ED arrival patient was again hypoglycemic to 30s.She was reportedly biting her hands and unable to follow commands from ED staff. Ongoing combativeness and hallucinations in the setting of likely EtOH withdrawal prompting initiation of CIWA protocol (Ativan). Additionally, Haldol and Geodon were administered and soft restraints were used. Patient had multiple lab abnormalities including electrolyte derangements with Na 122, K 2.3; mild AKI with Cr 1.01 (baseline 0.6) and WBC 20K. Of note, quantitative blood beta HCG was low positive (6.9) and this was repeated. CT Head was negative for acute intracranial abnormalities.   As patient began to wake, she became hypertensive and remained confused/combative prompting PCCM consult/ICU admission for Precedex initiation.  Pertinent Medical History:   Past Medical History:   Diagnosis Date   Asthma    Bipolar 1 disorder ([place])    Hypertension   Hip dysplasia Chronic pain EtOH abuse  Significant Hospital Events: Including procedures, antibiotic start and stop dates in addition to other pertinent events   11/18 - Presented to ED for AMS, hypoglycemia and c/f EtOH withdrawal. Admitted to ICU for Precedex/CIWA protocol.  Interim History / Subjective:  Overnight on precedex.   Objective:  Blood pressure 93/68, pulse 99, temperature 97.7 F (36.5 C), temperature source Axillary, resp. rate (!) 22, weight 54.7 kg, SpO2 100 %.        Intake/Output Summary (Last 24 hours) at 03/17/2021 1008 Last data filed at 03/17/2021 0700 Gross per 24 hour  Intake 2057.38 ml  Output 750 ml  Net 1307.38 ml   Filed Weights   03/16/21 2154 03/17/21 0200  Weight: 56.7 kg 54.7 kg    Physical Examination: General: acutely ill appearing, appears older than stated age HEENT: dry mucus membranes, right facial laceration anterior to the ear which has some oozing.  Neuro:lethargic, responds to noxious stimuli, spontaneously moves all 4 extremities CV: RRR no mrg PULM: ctab no wheezes or crackles GI: Soft, nontender, minimally distended. Normoactive bowel sounds. Extremities: Trace symmetric BLE edema noted. Skin: warm dry, multiple bruises on knee  Resolved Hospital Problem List:    Assessment & Plan:  Stacey Wong is seen in consultation at the request of IMTS for recommendations on further evaluation and management of AMS and presumed EtOH withdrawal.  Acute EtOH withdrawal with delirium Toxic metabolic encephalopathy - Admit to ICU for close monitoring - Goal RASS 0 to -1 - Precedex gtt. Will add phenobarbital taper since  beta hcg quant negative - CIWA protocol; q4 hours - Thiamine, folate, MV - Correct electrolyte derangements as below  Hypoglycemia - CBGs Q4H - D50 PRN - Hypoglycemia protocol in place  Mild  AKI Hyponatremia Hypokalemia Hypomagnesemia Hypocalcemia - Trend BMP Q6H -we are behind on labs so will call down to phlebotomy.  - Fluid resuscitation as appropriate. Continue NS at 100 for now.  - Replete electrolytes as indicated - F/u urine studies (urine osm, urine Na) - Monitor I&Os - Avoid nephrotoxic agents as able - Ensure adequate renal perfusion - high risk for refeeding syndrome - replace calcium  Hip dysplasia Chronic pain EtOH abuse History of chronic hip dysplasia per mother; self-medicates with EtOH. - Pain management - Encourage EtOH cessation/provide resources once able to participate in care  Bipolar disorder Not on any medications. - Consider Psychiatry consult while in-house. Not while actively withdrawing.  Best Practice: (right click and "Reselect all SmartList Selections" daily)   Diet/type: NPO DVT prophylaxis: prophylactic heparin  GI prophylaxis: N/A Lines: N/A Foley:  N/A Code Status:  full code Last date of multidisciplinary goals of care discussion [will update family at bedside today - mother and spouse coming]  The patient is critically ill due to encephalopathy, etoh withdrawal.  Critical care was necessary to treat or prevent imminent or life-threatening deterioration.  Critical care was time spent personally by me on the following activities: development of treatment plan with patient and/or surrogate as well as nursing, discussions with consultants, evaluation of patient's response to treatment, examination of patient, obtaining history from patient or surrogate, ordering and performing treatments and interventions, ordering and review of laboratory studies, ordering and review of radiographic studies, pulse oximetry, re-evaluation of patient's condition and participation in multidisciplinary rounds.   Critical Care Time devoted to patient care services described in this note is 35 minutes. This time reflects time of care of this Regino Ramirez . This critical care time does not reflect separately billable procedures or procedure time, teaching time or supervisory time of PA/NP/Med student/Med Resident etc but could involve care discussion time.       [place] [facility] 03/17/2021 10:20 AM  Pager: see AMION  If no response to pager , please call critical care on call (see AMION) until 7pm After 7:00 pm call Elink      Labs:  CBC: Recent Labs  Lab 03/16/21 1249  WBC 20.6*  NEUTROABS 19.0*  HGB 9.8*  HCT 27.1*  MCV 112.0*  PLT 893   Basic Metabolic Panel: Recent Labs  Lab 03/16/21 1249 03/16/21 1912 03/16/21 1916 03/17/21 0015 03/17/21 0416  NA 122*  --  125* 124* 122*  K 2.3*  --  2.2* 2.6* 3.1*  CL 72*  --  86* 84* 82*  CO2 27  --  21* 26 23  GLUCOSE 81  --  69* 80 83  BUN 9  --  11 14 17   CREATININE 1.01*  --  0.94 0.97 0.91  CALCIUM 6.6*  --  5.4* 5.7* 6.0*  MG  --  0.8*  --   --   --   PHOS  --  4.9*  --   --  4.5   GFR: CrCl cannot be calculated (Unknown ideal weight.). Recent Labs  Lab 03/16/21 1249  WBC 20.6*   Liver Function Tests: Recent Labs  Lab 03/16/21 1249 03/17/21 0416  AST <5* 182*  ALT <5 38  ALKPHOS 438* 292*  BILITOT 5.9* QUANTITY NOT SUFFICIENT, UNABLE TO PERFORM TEST  PROT 5.1* 4.0*  ALBUMIN 2.3* 1.8*   No results for input(s): LIPASE, AMYLASE in the last 168 hours. Recent Labs  Lab 03/16/21 1249  AMMONIA 107*   ABG:    Component Value Date/Time   TCO2 24 03/31/2011 1335    Coagulation Profile: No results for input(s): INR, PROTIME in the last 168 hours.  Cardiac Enzymes: No results for input(s): CKTOTAL, CKMB, CKMBINDEX, TROPONINI in the last 168 hours.  HbA1C: No results found for: HGBA1C  CBG: Recent Labs  Lab 03/16/21 1706 03/17/21 0025 03/17/21 0143 03/17/21 0317 03/17/21 0729  GLUCAP 219* 103* 100* 101* 107*  0

## 2021-03-17 NOTE — Progress Notes (Signed)
eLink Physician-Brief Progress Note Patient Name: Stacey Wong DOB: March 26, 1981 MRN: [id number]   Date of Service  03/17/2021  HPI/Events of Note  Bladder scan 580cc  eICU Interventions  I&O cath ordered     Intervention Category Minor Interventions: Other:  [name] [name] 03/17/2021, 6:33 AM

## 2021-03-17 NOTE — Progress Notes (Signed)
Pharmacy Antibiotic Note  ZORAH BACKES is a 40 y.o. female admitted on 03/16/2021 with  cellulitis of the neck .  Pharmacy has been consulted for Zosyn dosing. WBC elevated. Renal function good.   Plan: Zosyn 3.375G IV q8h to be infused over 4 hours Trend WBC, temp, renal function  F/U infectious work-up   Weight: 54.7 kg (120 lb 9.5 oz)  Temp (24hrs), Avg:98.5 F (36.9 C), Min:97.3 F (36.3 C), Max:99.5 F (37.5 C)  Recent Labs  Lab 03/16/21 1249 03/16/21 1916 03/17/21 0015 03/17/21 0416  WBC 20.6*  --   --   --   CREATININE 1.01* 0.94 0.97 0.91    CrCl cannot be calculated (Unknown ideal weight.).    No Known Allergies  Narda Bonds, PharmD, BCPS Clinical Pharmacist Phone: [phone]

## 2021-03-17 NOTE — Progress Notes (Signed)
eLink Physician-Brief Progress Note Patient Name: LARINDA HERTER DOB: 1980-08-18 MRN: [id number]   Date of Service  03/17/2021  HPI/Events of Note  Hypocalcemia - Ca++ = 6.3 which corrects to 8.06 (Low) given Albumin = 1.8.  eICU Interventions  Will replace Ca++.      Intervention Category Major Interventions: Electrolyte abnormality - evaluation and management  Prakriti Carignan [place] 03/17/2021, 8:54 PM

## 2021-03-17 NOTE — Progress Notes (Signed)
eLink Physician-Brief Progress Note Patient Name: Stacey Wong DOB: 02-24-1981 MRN: [id number]   Date of Service  03/17/2021  HPI/Events of Note  70F with bipolar and schizoaffective dz admitted for alcohol withdrawal, hypoglycemia and altered mental status. PCCM consulted for agitation requiring Precedex gtt. Hx obtained via chart review.  EMS called for hypoglycemia. Patient given D10 with improvement of CVG from 60>200. In the ED patient had hallucinations and biting hands. Has been taken off of home meds four years ago.  Labs reviewed: Hyponatremia 120s, hypokalemia ~26. WBC 21  Camera check: Sedated on 0.6 Precedex, no acute distress.  eICU Interventions  -Agitation/Alcohol withdrawal - Precedex gtt, CIWA protocol, thiamine, folate and multivitamin -IVF resuscitation -Replete K. Will complete by 6 AM. Trend labs     Intervention Category Evaluation Type: New Patient Evaluation  Ara Mano [name] 03/17/2021, 2:42 AM

## 2021-03-18 LAB — CBC
HCT: 21.8 % — ABNORMAL LOW (ref 36.0–46.0)
Hemoglobin: 7.6 g/dL — ABNORMAL LOW (ref 12.0–15.0)
MCH: 41.8 pg — ABNORMAL HIGH (ref 26.0–34.0)
MCHC: 34.9 g/dL (ref 30.0–36.0)
MCV: 119.8 fL — ABNORMAL HIGH (ref 80.0–100.0)
Platelets: 277 10*3/uL (ref 150–400)
RBC: 1.82 MIL/uL — ABNORMAL LOW (ref 3.87–5.11)
RDW: 22 % — ABNORMAL HIGH (ref 11.5–15.5)
WBC: 11.9 10*3/uL — ABNORMAL HIGH (ref 4.0–10.5)
nRBC: 0 % (ref 0.0–0.2)

## 2021-03-18 LAB — COMPREHENSIVE METABOLIC PANEL
ALT: 42 U/L (ref 0–44)
AST: 147 U/L — ABNORMAL HIGH (ref 15–41)
Albumin: 1.6 g/dL — ABNORMAL LOW (ref 3.5–5.0)
Alkaline Phosphatase: 251 U/L — ABNORMAL HIGH (ref 38–126)
Anion gap: 14 (ref 5–15)
BUN: 13 mg/dL (ref 6–20)
CO2: 25 mmol/L (ref 22–32)
Calcium: 6.9 mg/dL — ABNORMAL LOW (ref 8.9–10.3)
Chloride: 95 mmol/L — ABNORMAL LOW (ref 98–111)
Creatinine, Ser: 0.64 mg/dL (ref 0.44–1.00)
GFR, Estimated: >60 mL/min (ref >60)
Glucose, Bld: 86 mg/dL (ref 70–99)
Potassium: 3.5 mmol/L (ref 3.5–5.1)
Sodium: 134 mmol/L — ABNORMAL LOW (ref 135–145)
Total Bilirubin: 1.7 mg/dL — ABNORMAL HIGH (ref 0.3–1.2)
Total Protein: 3.7 g/dL — ABNORMAL LOW (ref 6.5–8.1)

## 2021-03-18 LAB — URINALYSIS, ROUTINE W REFLEX MICROSCOPIC
Bacteria, UA: NONE SEEN
Bilirubin Urine: NEGATIVE
Glucose, UA: NEGATIVE mg/dL
Ketones, ur: 5 mg/dL — AB
Leukocytes,Ua: NEGATIVE
Nitrite: NEGATIVE
Protein, ur: NEGATIVE mg/dL
Specific Gravity, Urine: 1.008 (ref 1.005–1.030)
pH: 6 (ref 5.0–8.0)

## 2021-03-18 LAB — RAPID URINE DRUG SCREEN, HOSP PERFORMED
Amphetamines: NOT DETECTED
Barbiturates: POSITIVE — AB
Benzodiazepines: POSITIVE — AB
Cocaine: NOT DETECTED
Opiates: NOT DETECTED
Tetrahydrocannabinol: NOT DETECTED

## 2021-03-18 LAB — GLUCOSE, CAPILLARY
Glucose-Capillary: 83 mg/dL (ref 70–99)
Glucose-Capillary: 83 mg/dL (ref 70–99)
Glucose-Capillary: 88 mg/dL (ref 70–99)
Glucose-Capillary: 113 mg/dL — ABNORMAL HIGH (ref 70–99)
Glucose-Capillary: 106 mg/dL — ABNORMAL HIGH (ref 70–99)
Glucose-Capillary: 104 mg/dL — ABNORMAL HIGH (ref 70–99)

## 2021-03-18 LAB — SODIUM, URINE, RANDOM: Sodium, Ur: <10 mmol/L

## 2021-03-18 LAB — OSMOLALITY, URINE: Osmolality, Ur: 234 mOsm/kg — ABNORMAL LOW (ref 300–900)

## 2021-03-18 LAB — MAGNESIUM: Magnesium: 1.8 mg/dL (ref 1.7–2.4)

## 2021-03-18 MED ORDER — DEXTROSE-NACL 5-0.9 % IV SOLN: INTRAVENOUS | Status: DC

## 2021-03-18 MED ORDER — NICOTINE 14 MG/24HR TD PT24
14.0000 mg | MEDICATED_PATCH | Freq: Every day | TRANSDERMAL | Status: DC
  Administered 2021-03-18 – 2021-03-21 (×3): 14 mg via TRANSDERMAL
  Filled 2021-03-18 (×4): qty 1

## 2021-03-18 MED ORDER — MAGNESIUM SULFATE 2 GM/50ML IV SOLN
2.0000 g | Freq: Once | INTRAVENOUS | Status: AC
  Administered 2021-03-18: 2 g via INTRAVENOUS
  Filled 2021-03-18: qty 50

## 2021-03-18 MED ORDER — LIP MEDEX EX OINT
TOPICAL_OINTMENT | CUTANEOUS | Status: DC | PRN
  Filled 2021-03-18: qty 7

## 2021-03-18 MED ORDER — POTASSIUM CHLORIDE 10 MEQ/100ML IV SOLN
10.0000 meq | INTRAVENOUS | Status: AC
  Administered 2021-03-18 (×4): 10 meq via INTRAVENOUS
  Filled 2021-03-18 (×4): qty 100

## 2021-03-18 NOTE — Progress Notes (Signed)
eLink Physician-Brief Progress Note Patient Name: Stacey Wong DOB: 08-Sep-1980 MRN: [id number]   Date of Service  03/18/2021  HPI/Events of Note  Hypoglycemia - Blood glucose = 79. Patient NPO with no enteral nutrition.   eICU Interventions  Plan: D5 0.9 NaCl to run IV at 50 mL/hour.      Intervention Category Major Interventions: Other:  Lysle Dingwall 03/18/2021, 12:59 AM

## 2021-03-18 NOTE — Progress Notes (Signed)
NAME:  Stacey Wong, MRN:  [id number], DOB:  1981-04-15, LOS: 2 ADMISSION DATE:  03/16/2021 CONSULTATION DATE:  03/16/2021 REFERRING MD:  Graciella Freer CHIEF COMPLAINT:  EtOH Withdrawal   History of Present Illness:  Ms. Stacey Wong is seen in consultation at the request of IMTS for recommendations on further evaluation and management of AMS and presumed EtOH withdrawal.  40 year old woman who presented to [facility] ED 11/18 via EMS for hypoglycemia, AMS and concern for EtOH withdrawal. PMHx significant for HTN, asthma, BPD (not on medications), hip dysplasia with associated chronic pain and EtOH abuse. Per patient's mother (at bedside), she has been "self-medicating" her pain with alcohol for many years; stopped for a short time a few years ago but resumed drinking heavily in 2020. Per mother, patient drinks about a gallon of brown liquor over three days. She had not drank alcohol in a few days due to not feeling well and began to have AMS with hallucinations, prompting EMS contact.  On EMS arrival, was hypoglycemic to 60s and hallucinating. D10 was administered with improvement in CBG to 200s; however; on ED arrival patient was again hypoglycemic to 30s.She was reportedly biting her hands and unable to follow commands from ED staff. Ongoing combativeness and hallucinations in the setting of likely EtOH withdrawal prompting initiation of CIWA protocol (Ativan). Additionally, Haldol and Geodon were administered and soft restraints were used. Patient had multiple lab abnormalities including electrolyte derangements with Na 122, K 2.3; mild AKI with Cr 1.01 (baseline 0.6) and WBC 20K. Of note, quantitative blood beta HCG was low positive (6.9) and this was repeated. CT Head was negative for acute intracranial abnormalities.   As patient began to wake, she became hypertensive and remained confused/combative prompting PCCM consult/ICU admission for Precedex initiation.  Pertinent Medical History:   Past Medical History:   Diagnosis Date   Asthma    Bipolar 1 disorder ([place])    Hypertension   Hip dysplasia Chronic pain EtOH abuse  Significant Hospital Events: Including procedures, antibiotic start and stop dates in addition to other pertinent events   11/18 - Presented to ED for AMS, hypoglycemia and c/f EtOH withdrawal. Admitted to ICU for Precedex/CIWA protocol. 11/19 hypoglycemia overnight  Interim History / Subjective:  Had acute urinary retention overnight and started on foley catheter.    Objective:  Blood pressure 99/66, pulse 91, temperature 99.1 F (37.3 C), resp. rate 20, weight 59.1 kg, SpO2 100 %.        Intake/Output Summary (Last 24 hours) at 03/18/2021 0940 Last data filed at 03/18/2021 0700 Gross per 24 hour  Intake 2767 ml  Output 1950 ml  Net 817 ml   Filed Weights   03/16/21 2154 03/17/21 0200 03/18/21 0441  Weight: 56.7 kg 54.7 kg 59.1 kg    Physical Examination: General: acutely and chronically ill appearing, older than stated age HEENT: mmm, right anterior auricular excoriation Neuro: RASS -2, moves all 4 extremities CV: tachycardic, regular PULM: breathing non labored, clear to auscultation GI: soft, nt nd Extremities: no edema Skin: warm dry, bruises  Resolved Hospital Problem List:    Assessment & Plan:  [name] is seen in consultation at the request of IMTS for recommendations on further evaluation and management of AMS and presumed EtOH withdrawal.  Acute EtOH withdrawal with delirium Toxic metabolic encephalopathy - Admit to ICU for close monitoring - Goal RASS 0 to -1 - Precedex gtt, taper down as tolerated - continue phenobarbital as ordered - CIWA protocol; q4 hours - Thiamine,  folate, MV - Correct electrolyte derangements as below  Hypoglycemia - CBGs Q4H - she has been hypoglycemic. Will start tube feeds through NG if not tolerating po.  - D50 PRN - Hypoglycemia protocol in place  Mild  AKI Hyponatremia Hypokalemia Hypomagnesemia Hypocalcemia - change labs to daily - Fluid resuscitation as appropriate. Continue D5 NS for now at 4ml/hr  - Replete electrolytes as indicated - received K and Mag today - Monitor I&Os - Avoid nephrotoxic agents as able - Ensure adequate renal perfusion - high risk for refeeding syndrome  Hip dysplasia Chronic pain EtOH abuse History of chronic hip dysplasia per mother; self-medicates with EtOH. - Pain management - Encourage EtOH cessation/provide resources once able to participate in care  Bipolar disorder Not on any medications. - Consider Psychiatry consult while in-house. Not while actively withdrawing.  Best Practice: (right click and "Reselect all SmartList Selections" daily)   Diet/type: NPO DVT prophylaxis: prophylactic heparin  GI prophylaxis: N/A Lines: N/A Foley:  Yes, and it is still needed for acute urinary retention Code Status:  full code Last date of multidisciplinary goals of care discussion [11/19 updated husband at bedside]  The patient is critically ill due to encephalopathy.  Critical care was necessary to treat or prevent imminent or life-threatening deterioration.  Critical care was time spent personally by me on the following activities: development of treatment plan with patient and/or surrogate as well as nursing, discussions with consultants, evaluation of patient's response to treatment, examination of patient, obtaining history from patient or surrogate, ordering and performing treatments and interventions, ordering and review of laboratory studies, ordering and review of radiographic studies, pulse oximetry, re-evaluation of patient's condition and participation in multidisciplinary rounds.   Critical Care Time devoted to patient care services described in this note is 31 minutes. This time reflects time of care of this [place] . This critical care time does not reflect separately billable  procedures or procedure time, teaching time or supervisory time of PA/NP/Med student/Med Resident etc but could involve care discussion time.       Spero Geralds [place] [facility] 03/18/2021 9:40 AM  Pager: see AMION  If no response to pager , please call critical care on call (see AMION) until 7pm After 7:00 pm call Elink      Labs:  CBC: Recent Labs  Lab 03/16/21 1249 03/17/21 1144 03/18/21 0317  WBC 20.6* 17.1* 11.9*  NEUTROABS 19.0*  --   --   HGB 9.8* 8.0* 7.6*  HCT 27.1* 22.6* 21.8*  MCV 112.0* 113.0* 119.8*  PLT 338 338 409   Basic Metabolic Panel: Recent Labs  Lab 03/16/21 1912 03/16/21 1916 03/17/21 0015 03/17/21 0416 03/17/21 1144 03/17/21 1912 03/18/21 0317  NA  --  125* 124* 122*  --  132* 134*  K  --  2.2* 2.6* 3.1*  --  3.3* 3.5  CL  --  86* 84* 82*  --  94* 95*  CO2  --  21* 26 23  --  25 25  GLUCOSE  --  69* 80 83  --  84 86  BUN  --  11 14 17   --  14 13  CREATININE  --  0.94 0.97 0.91  --  0.64 0.64  CALCIUM  --  5.4* 5.7* 6.0*  --  6.3* 6.9*  MG 0.8*  --   --   --  2.2  --  1.8  PHOS 4.9*  --   --  4.5  --   --   --    GFR: CrCl cannot be calculated (Unknown ideal weight.). Recent Labs  Lab 03/16/21 1249 03/17/21 1144 03/18/21 0317  WBC 20.6* 17.1* 11.9*   Liver Function Tests: Recent Labs  Lab 03/16/21 1249 03/17/21 0416 03/18/21 0317  AST <5* 182* 147*  ALT <5 38 42  ALKPHOS 438* 292* 251*  BILITOT 5.9* QUANTITY NOT SUFFICIENT, UNABLE TO PERFORM TEST 1.7*  PROT 5.1* 4.0* 3.7*  ALBUMIN 2.3* 1.8* 1.6*   No results for input(s): LIPASE, AMYLASE in the last 168 hours. Recent Labs  Lab 03/16/21 1249  AMMONIA 107*   ABG:    Component Value Date/Time   TCO2 24 03/31/2011 1335    Coagulation Profile: No results for input(s): INR, PROTIME in the last 168 hours.  Cardiac Enzymes: No results for input(s): CKTOTAL, CKMB, CKMBINDEX, TROPONINI in the last 168 hours.  HbA1C: No results found for:  HGBA1C  CBG: Recent Labs  Lab 03/17/21 1505 03/17/21 1922 03/17/21 2316 03/18/21 0316 03/18/21 0740  GLUCAP 91 88 79 83 83  0

## 2021-03-19 ENCOUNTER — Inpatient Hospital Stay (HOSPITAL_COMMUNITY): Payer: Self-pay

## 2021-03-19 DIAGNOSIS — F3171 Bipolar disorder, in partial remission, most recent episode hypomanic: Secondary | ICD-10-CM

## 2021-03-19 LAB — BASIC METABOLIC PANEL
Anion gap: 9 (ref 5–15)
BUN: 5 mg/dL — ABNORMAL LOW (ref 6–20)
CO2: 26 mmol/L (ref 22–32)
Calcium: 7.1 mg/dL — ABNORMAL LOW (ref 8.9–10.3)
Chloride: 99 mmol/L (ref 98–111)
Creatinine, Ser: 0.45 mg/dL (ref 0.44–1.00)
GFR, Estimated: >60 mL/min (ref >60)
Glucose, Bld: 96 mg/dL (ref 70–99)
Potassium: 3.3 mmol/L — ABNORMAL LOW (ref 3.5–5.1)
Sodium: 134 mmol/L — ABNORMAL LOW (ref 135–145)

## 2021-03-19 LAB — GLUCOSE, CAPILLARY
Glucose-Capillary: 103 mg/dL — ABNORMAL HIGH (ref 70–99)
Glucose-Capillary: 105 mg/dL — ABNORMAL HIGH (ref 70–99)
Glucose-Capillary: 111 mg/dL — ABNORMAL HIGH (ref 70–99)
Glucose-Capillary: 115 mg/dL — ABNORMAL HIGH (ref 70–99)
Glucose-Capillary: 123 mg/dL — ABNORMAL HIGH (ref 70–99)
Glucose-Capillary: 140 mg/dL — ABNORMAL HIGH (ref 70–99)

## 2021-03-19 LAB — CBC
HCT: 21.7 % — ABNORMAL LOW (ref 36.0–46.0)
Hemoglobin: 7.4 g/dL — ABNORMAL LOW (ref 12.0–15.0)
MCH: 40.9 pg — ABNORMAL HIGH (ref 26.0–34.0)
MCHC: 34.1 g/dL (ref 30.0–36.0)
MCV: 119.9 fL — ABNORMAL HIGH (ref 80.0–100.0)
Platelets: 268 10*3/uL (ref 150–400)
RBC: 1.81 MIL/uL — ABNORMAL LOW (ref 3.87–5.11)
RDW: 21.4 % — ABNORMAL HIGH (ref 11.5–15.5)
WBC: 7.8 10*3/uL (ref 4.0–10.5)
nRBC: 0 % (ref 0.0–0.2)

## 2021-03-19 LAB — MAGNESIUM: Magnesium: 1.6 mg/dL — ABNORMAL LOW (ref 1.7–2.4)

## 2021-03-19 LAB — CALCIUM, IONIZED: Calcium, Ionized, Serum: 3.5 mg/dL — ABNORMAL LOW (ref 4.5–5.6)

## 2021-03-19 MED ORDER — POTASSIUM CHLORIDE CRYS ER 20 MEQ PO TBCR
20.0000 meq | EXTENDED_RELEASE_TABLET | ORAL | Status: AC
  Administered 2021-03-19: 20 meq via ORAL
  Filled 2021-03-19 (×2): qty 1

## 2021-03-19 MED ORDER — POTASSIUM CHLORIDE 20 MEQ PO PACK
20.0000 meq | PACK | Freq: Once | ORAL | Status: AC
  Administered 2021-03-19: 20 meq
  Filled 2021-03-19: qty 1

## 2021-03-19 MED ORDER — QUETIAPINE FUMARATE 50 MG PO TABS
50.0000 mg | ORAL_TABLET | Freq: Every day | ORAL | Status: AC
  Administered 2021-03-19: 50 mg via ORAL
  Filled 2021-03-19: qty 1

## 2021-03-19 MED ORDER — POTASSIUM CHLORIDE 10 MEQ/100ML IV SOLN
10.0000 meq | INTRAVENOUS | Status: AC
  Administered 2021-03-19 (×4): 10 meq via INTRAVENOUS
  Filled 2021-03-19 (×4): qty 100

## 2021-03-19 NOTE — Progress Notes (Signed)
eLink Physician-Brief Progress Note Patient Name: Stacey Wong DOB: [birth year] MRN: [id number]   Date of Service  03/19/2021  HPI/Events of Note  Agitated overnight on Precedex 0.7 S/p Ativan 2 mg and PM dose of seroquel and phenobarbital  eICU Interventions  Encouraged RN to continue to use Ativan PRN as prescribed  Ordered bilateral wrist and ankle restraints     Intervention Category Intermediate Interventions: Pain - evaluation and management  Cristoval Teall [name] 03/19/2021, 10:58 PM

## 2021-03-19 NOTE — Progress Notes (Signed)
NAME:  Stacey Wong, MRN:  [id number], DOB:  Sep 09, 1980, LOS: 3 ADMISSION DATE:  03/16/2021 CONSULTATION DATE:  03/16/2021 REFERRING MD:  Graciella Freer CHIEF COMPLAINT:  EtOH Withdrawal   History of Present Illness:  Stacey Wong is seen in consultation at the request of IMTS for recommendations on further evaluation and management of AMS and presumed EtOH withdrawal.  40 year old woman who presented to [facility] ED 11/18 via EMS for hypoglycemia, AMS and concern for EtOH withdrawal. PMHx significant for HTN, asthma, BPD (not on medications), hip dysplasia with associated chronic pain and EtOH abuse. Per patient's mother (at bedside), she has been "self-medicating" her pain with alcohol for many years; stopped for a short time a few years ago but resumed drinking heavily in 2020. Per mother, patient drinks about a gallon of brown liquor over three days. She had not drank alcohol in a few days due to not feeling well and began to have AMS with hallucinations, prompting EMS contact.  On EMS arrival, was hypoglycemic to 60s and hallucinating. D10 was administered with improvement in CBG to 200s; however; on ED arrival patient was again hypoglycemic to 30s.She was reportedly biting her hands and unable to follow commands from ED staff. Ongoing combativeness and hallucinations in the setting of likely EtOH withdrawal prompting initiation of CIWA protocol (Ativan). Additionally, Haldol and Geodon were administered and soft restraints were used. Patient had multiple lab abnormalities including electrolyte derangements with Na 122, K 2.3; mild AKI with Cr 1.01 (baseline 0.6) and WBC 20K. Of note, quantitative blood beta HCG was low positive (6.9) and this was repeated. CT Head was negative for acute intracranial abnormalities.   As patient began to wake, she became hypertensive and remained confused/combative prompting PCCM consult/ICU admission for Precedex initiation.  Pertinent Medical History:   Past Medical History:   Diagnosis Date   Asthma    Bipolar 1 disorder ([place])    Hypertension   Hip dysplasia Chronic pain EtOH abuse  Significant Hospital Events: Including procedures, antibiotic start and stop dates in addition to other pertinent events   11/18 - Presented to ED for AMS, hypoglycemia and c/f EtOH withdrawal. Admitted to ICU for Precedex/CIWA protocol. 11/19 hypoglycemia overnight  Interim History / Subjective:  No acute overnight events. Continued on precedex gtt  Objective:  Blood pressure 106/85, pulse 88, temperature 98.8 F (37.1 C), resp. rate (!) 21, weight 60 kg, SpO2 97 %.        Intake/Output Summary (Last 24 hours) at 03/19/2021 0811 Last data filed at 03/19/2021 5638 Gross per 24 hour  Intake 2229.19 ml  Output 910 ml  Net 1319.19 ml   Filed Weights   03/17/21 0200 03/18/21 0441 03/19/21 0500  Weight: 54.7 kg 59.1 kg 60 kg    Physical Examination: General: acutely and chronically ill appearing, older than stated age 29: mmm, right anterior auricular excoriation with dried blood  Neuro: somnolent but arousable, spontaneously moves all 4 extremities CV: tachycardic, regular PULM: breathing non labored, clear to auscultation GI: soft, nontender, nondistended, +BS Extremities: no edema Skin: warm dry, bruises  Resolved Hospital Problem List:  Mild AKI Hypokalemia  Assessment & Plan:  Acute EtOH withdrawal with delirium Toxic metabolic encephalopathy - Wean precedex gtt as tolerated for goal RASS 0 to -1.  - continue phenobarbital taper - CIWA protocol; q4 hours - Thiamine, folate, MV - Correct electrolyte derangements as below  Hypoglycemia - improved - Hypoglycemia protocol in place - Encourage PO intake; may need tube feeding if not  tolerating PO   Hyponatremia Hypomagnesemia Hypocalcemia -Continue D5 NS for now at 13ml/hr  - Replete electrolytes as indicated  - Monitor I&Os - Avoid nephrotoxic agents as able - Ensure adequate renal  perfusion - high risk for refeeding syndrome  Hip dysplasia Chronic pain EtOH abuse History of chronic hip dysplasia per mother; self-medicates with EtOH. - Pain management - Encourage EtOH cessation/provide resources once able to participate in care  Bipolar disorder Not on any medications. - Consider Psychiatry consult while in-house. Not while actively withdrawing.  Best Practice: (right click and "Reselect all SmartList Selections" daily)   Diet/type: clear liquids DVT prophylaxis: prophylactic heparin  GI prophylaxis: N/A Lines: N/A Foley:  Yes, and it is still needed for acute urinary retention Code Status:  full code Last date of multidisciplinary goals of care discussion [11/19 updated husband at bedside]  Labs:  CBC: Recent Labs  Lab 03/16/21 1249 03/17/21 1144 03/18/21 0317 03/19/21 0135  WBC 20.6* 17.1* 11.9* 7.8  NEUTROABS 19.0*  --   --   --   HGB 9.8* 8.0* 7.6* 7.4*  HCT 27.1* 22.6* 21.8* 21.7*  MCV 112.0* 113.0* 119.8* 119.9*  PLT 338 338 277 585   Basic Metabolic Panel: Recent Labs  Lab 03/16/21 1912 03/16/21 1916 03/17/21 0015 03/17/21 0416 03/17/21 1144 03/17/21 1912 03/18/21 0317 03/19/21 0135  NA  --    < > 124* 122*  --  132* 134* 134*  K  --    < > 2.6* 3.1*  --  3.3* 3.5 3.3*  CL  --    < > 84* 82*  --  94* 95* 99  CO2  --    < > 26 23  --  25 25 26   GLUCOSE  --    < > 80 83  --  84 86 96  BUN  --    < > 14 17  --  14 13 5*  CREATININE  --    < > 0.97 0.91  --  0.64 0.64 0.45  CALCIUM  --    < > 5.7* 6.0*  --  6.3* 6.9* 7.1*  MG 0.8*  --   --   --  2.2  --  1.8  --   PHOS 4.9*  --   --  4.5  --   --   --   --    < > = values in this interval not displayed.   GFR: CrCl cannot be calculated (Unknown ideal weight.). Recent Labs  Lab 03/16/21 1249 03/17/21 1144 03/18/21 0317 03/19/21 0135  WBC 20.6* 17.1* 11.9* 7.8   Liver Function Tests: Recent Labs  Lab 03/16/21 1249 03/17/21 0416 03/18/21 0317  AST <5* 182* 147*  ALT  <5 38 42  ALKPHOS 438* 292* 251*  BILITOT 5.9* QUANTITY NOT SUFFICIENT, UNABLE TO PERFORM TEST 1.7*  PROT 5.1* 4.0* 3.7*  ALBUMIN 2.3* 1.8* 1.6*   No results for input(s): LIPASE, AMYLASE in the last 168 hours. Recent Labs  Lab 03/16/21 1249  AMMONIA 107*   ABG:    Component Value Date/Time   TCO2 24 03/31/2011 1335    Coagulation Profile: No results for input(s): INR, PROTIME in the last 168 hours.  Cardiac Enzymes: No results for input(s): CKTOTAL, CKMB, CKMBINDEX, TROPONINI in the last 168 hours.  HbA1C: No results found for: HGBA1C  CBG: Recent Labs  Lab 03/18/21 1622 03/18/21 1934 03/18/21 2315 03/19/21 0337 03/19/21 0800  GLUCAP 113* 106* 104* 123* 105*  Harvie Heck, MD Internal Medicine, PGY-3 03/19/21 8:16 AM Pager # [phone]  If no response to pager , please call critical care on call (see AMION) until 7pm After 7:00 pm call Elink

## 2021-03-19 NOTE — Progress Notes (Signed)
[facility] ADULT ICU REPLACEMENT PROTOCOL   The patient does apply for the [facility] Adult ICU Electrolyte Replacment Protocol based on the criteria listed below:   1.Exclusion criteria: TCTS patients, ECMO patients, and Dialysis patients 2. Is GFR >/= 30 ml/min? Yes.    Patient's GFR today is >60 3. Is SCr </= 2? Yes.   Patient's SCr is 0.45 mg/dL 4. Did SCr increase >/= 0.5 in 24 hours? No. 5.Pt's weight >40kg  Yes.   6. Abnormal electrolyte(s):  K 3.3  7. Electrolytes replaced per protocol 8.  Call MD STAT for K+ </= 2.5, Phos </= 1, or Mag </= 1 Physician:  S. Rosie Fate R Leilan Bochenek 03/19/2021 2:57 AM

## 2021-03-19 NOTE — Procedures (Signed)
Cortrak  Person Inserting Tube:  Alroy Dust, Kalia Vahey L, RD Tube Type:  Cortrak - 43 inches Tube Size:  10 Tube Location:  Left nare Initial Placement:  Stomach Secured by: Bridle Technique Used to Measure Tube Placement:  Marking at nare/corner of mouth Cortrak Secured At:  65 cm  Cortrak Tube Team Note:  Consult received to place a Cortrak feeding tube.   X-ray is required, abdominal x-ray has been ordered by the Cortrak team. Please confirm tube placement before using the Cortrak tube.   If the tube becomes dislodged please keep the tube and contact the Cortrak team at [email or web address] (password [email or web address]) for replacement.  If after hours and replacement cannot be delayed, place a NG tube and confirm placement with an abdominal x-ray.    Hermina Barters BS, PLDN Clinical Dietitian See [facility] for contact information.

## 2021-03-20 DIAGNOSIS — J45909 Unspecified asthma, uncomplicated: Secondary | ICD-10-CM

## 2021-03-20 LAB — GLUCOSE, CAPILLARY
Glucose-Capillary: 104 mg/dL — ABNORMAL HIGH (ref 70–99)
Glucose-Capillary: 114 mg/dL — ABNORMAL HIGH (ref 70–99)
Glucose-Capillary: 125 mg/dL — ABNORMAL HIGH (ref 70–99)
Glucose-Capillary: 130 mg/dL — ABNORMAL HIGH (ref 70–99)
Glucose-Capillary: 135 mg/dL — ABNORMAL HIGH (ref 70–99)
Glucose-Capillary: 94 mg/dL (ref 70–99)

## 2021-03-20 LAB — BASIC METABOLIC PANEL
Anion gap: 3 — ABNORMAL LOW (ref 5–15)
BUN: <5 mg/dL — ABNORMAL LOW (ref 6–20)
CO2: 30 mmol/L (ref 22–32)
Calcium: 7.4 mg/dL — ABNORMAL LOW (ref 8.9–10.3)
Chloride: 100 mmol/L (ref 98–111)
Creatinine, Ser: 0.33 mg/dL — ABNORMAL LOW (ref 0.44–1.00)
GFR, Estimated: >60 mL/min (ref >60)
Glucose, Bld: 115 mg/dL — ABNORMAL HIGH (ref 70–99)
Potassium: 3.4 mmol/L — ABNORMAL LOW (ref 3.5–5.1)
Sodium: 133 mmol/L — ABNORMAL LOW (ref 135–145)

## 2021-03-20 LAB — MAGNESIUM: Magnesium: 1.4 mg/dL — ABNORMAL LOW (ref 1.7–2.4)

## 2021-03-20 LAB — CBC
HCT: 19.9 % — ABNORMAL LOW (ref 36.0–46.0)
Hemoglobin: 7 g/dL — ABNORMAL LOW (ref 12.0–15.0)
MCH: 42.2 pg — ABNORMAL HIGH (ref 26.0–34.0)
MCHC: 35.2 g/dL (ref 30.0–36.0)
MCV: 119.9 fL — ABNORMAL HIGH (ref 80.0–100.0)
Platelets: 238 10*3/uL (ref 150–400)
RBC: 1.66 MIL/uL — ABNORMAL LOW (ref 3.87–5.11)
RDW: 19.4 % — ABNORMAL HIGH (ref 11.5–15.5)
WBC: 7.3 10*3/uL (ref 4.0–10.5)
nRBC: 0 % (ref 0.0–0.2)

## 2021-03-20 LAB — PHOSPHORUS: Phosphorus: 1.1 mg/dL — ABNORMAL LOW (ref 2.5–4.6)

## 2021-03-20 MED ORDER — METOPROLOL TARTRATE 12.5 MG HALF TABLET
12.5000 mg | ORAL_TABLET | Freq: Two times a day (BID) | ORAL | Status: AC
  Administered 2021-03-20 – 2021-03-21 (×4): 12.5 mg
  Filled 2021-03-20 (×4): qty 1

## 2021-03-20 MED ORDER — LACTATED RINGERS IV BOLUS
1000.0000 mL | Freq: Once | INTRAVENOUS | Status: AC
  Administered 2021-03-20: 1000 mL via INTRAVENOUS

## 2021-03-20 MED ORDER — MAGNESIUM SULFATE 2 GM/50ML IV SOLN
2.0000 g | Freq: Once | INTRAVENOUS | Status: AC
  Administered 2021-03-20: 2 g via INTRAVENOUS
  Filled 2021-03-20: qty 50

## 2021-03-20 MED ORDER — POTASSIUM CHLORIDE 20 MEQ PO PACK
40.0000 meq | PACK | Freq: Once | ORAL | Status: AC
  Administered 2021-03-20: 40 meq
  Filled 2021-03-20: qty 2

## 2021-03-20 MED ORDER — CHLORHEXIDINE GLUCONATE 0.12 % MT SOLN
15.0000 mL | Freq: Two times a day (BID) | OROMUCOSAL | Status: DC
  Administered 2021-03-20 – 2021-03-26 (×13): 15 mL via OROMUCOSAL
  Filled 2021-03-20 (×10): qty 15

## 2021-03-20 MED ORDER — TAMSULOSIN HCL 0.4 MG PO CAPS
0.4000 mg | ORAL_CAPSULE | Freq: Every day | ORAL | Status: DC
  Administered 2021-03-20 – 2021-03-26 (×7): 0.4 mg via ORAL
  Filled 2021-03-20 (×8): qty 1

## 2021-03-20 MED ORDER — INSULIN ASPART 100 UNIT/ML IJ SOLN
0.0000 [IU] | INTRAMUSCULAR | Status: DC | PRN
  Administered 2021-03-22 – 2021-03-23 (×4): 1 [IU] via SUBCUTANEOUS
  Administered 2021-03-24: 2 [IU] via SUBCUTANEOUS

## 2021-03-20 MED ORDER — QUETIAPINE FUMARATE 100 MG PO TABS
200.0000 mg | ORAL_TABLET | Freq: Every day | ORAL | Status: DC
  Administered 2021-03-20: 200 mg via ORAL
  Filled 2021-03-20: qty 2

## 2021-03-20 MED ORDER — JEVITY 1.2 CAL PO LIQD
1000.0000 mL | ORAL | Status: DC
  Administered 2021-03-20 – 2021-03-23 (×4): 1000 mL
  Filled 2021-03-20 (×11): qty 1000

## 2021-03-20 MED ORDER — ORAL CARE MOUTH RINSE
15.0000 mL | Freq: Two times a day (BID) | OROMUCOSAL | Status: DC
  Administered 2021-03-20 – 2021-03-26 (×10): 15 mL via OROMUCOSAL

## 2021-03-20 MED ORDER — QUETIAPINE FUMARATE 100 MG PO TABS: 100.0000 mg | ORAL_TABLET | Freq: Every day | ORAL | Status: DC

## 2021-03-20 MED ORDER — POTASSIUM CHLORIDE 20 MEQ PO PACK
40.0000 meq | PACK | Freq: Two times a day (BID) | ORAL | Status: AC
  Administered 2021-03-20 (×2): 40 meq
  Filled 2021-03-20 (×2): qty 2

## 2021-03-20 NOTE — Progress Notes (Signed)
Initial Nutrition Assessment  DOCUMENTATION CODES:   Not applicable  INTERVENTION:   Initiate tube feeding via Cortrak tube: Jevity 1.2 at 20 ml/h, increase by 10 ml every 4 hours to goal rate of 70 ml/h (1680 ml per day)  Provides 2016 kcal, 93 gm protein, 1361 ml free water daily  Monitor magnesium, potassium, and phosphorus BID for at least 3 days, MD to replete as needed, as pt is at risk for refeeding syndrome given history of ETOH abuse.   NUTRITION DIAGNOSIS:   Inadequate oral intake related to lethargy/confusion as evidenced by meal completion < 25%.  GOAL:   Patient will meet greater than or equal to 90% of their needs  MONITOR:   TF tolerance, Labs, Diet advancement  REASON FOR ASSESSMENT:   Consult Enteral/tube feeding initiation and management  ASSESSMENT:   40 yo female admitted with ETOH withdrawal, hypoglycemia. PMH includes HTN, asthma, hip dysplasia, chronic pain, ETOH abuse.  Discussed patient in ICU rounds and with RN today. Patient is very lethargic; intake is very poor. Cortrak was placed yesterday; okay to begin TF today per discussion with CCM.  Patient states that she doesn't eat much at home. She was unable to expand on that thought but did endorse an unknown amount of weight loss.  Currently on a clear liquid diet. Eating poorly d/t AMS; she falls asleep when she is eating.  Labs reviewed. Na 133, K 3.4, BUN < 5, creatinine 0.33 CBG: [phone]  Medications reviewed and include folic acid, MVI with minerals, phenobarbital, Klor-Con, Flomax, thiamine, Precedex, mag sulfate. IVF: D5 NS at 50 ml/h   Weight history reviewed. No recent weights available. Last weight from April 2022 (> 1 year ago). Current edema is likely masking weight loss and depletion of subcutaneous fat mass. Patient is at increased nutrition risk, given mild-moderate depletion of muscle mass and history of alcohol abuse.  NUTRITION - FOCUSED PHYSICAL EXAM:  Flowsheet Row  Most Recent Value  Orbital Region No depletion  Upper Arm Region No depletion  Thoracic and Lumbar Region No depletion  Buccal Region No depletion  Temple Region Mild depletion  Clavicle Bone Region Moderate depletion  Clavicle and Acromion Bone Region Mild depletion  Scapular Bone Region Mild depletion  Dorsal Hand No depletion  Patellar Region Moderate depletion  Anterior Thigh Region Moderate depletion  Posterior Calf Region Moderate depletion  Edema (RD Assessment) --  [generalized edema]  Hair Reviewed  Eyes Reviewed  Mouth Reviewed  Skin Reviewed  Nails Reviewed       Diet Order:   Diet Order             Diet clear liquid Room service appropriate? Yes; Fluid consistency: Thin  Diet effective now                   EDUCATION NEEDS:   Not appropriate for education at this time  Skin:  Skin Assessment: Skin Integrity Issues: Skin Integrity Issues:: DTI DTI: sacrum  Last BM:  11/21 type 6  Height:   Ht Readings from Last 1 Encounters:  03/20/21 5\' 4"  (1.626 m)    Weight:   Wt Readings from Last 1 Encounters:  03/20/21 60.9 kg    BMI:  Body mass index is 23.05 kg/m.  Estimated Nutritional Needs:   Kcal:  1900-2100  Protein:  85-100 gm  Fluid:  1.9-2.1 L    Lucas Mallow, RD, LDN, CNSC Please refer to Amion for contact information.

## 2021-03-20 NOTE — Consult Note (Signed)
[facility] Face-to-Face Psychiatry Consult   Reason for Consult:  Alcohol withdrawal agitation, Hx of Bipolar disorder, currently untreated Referring Physician:  Marshell Garfinkel, MD Patient Identification: Stacey Wong MRN:  [id number] Principal Diagnosis: Alcohol withdrawal ([place]) Diagnosis:  Principal Problem:   Alcohol withdrawal ([place]) Active Problems:   Bipolar disorder ([place])   Asthma   Panic disorder   Hypokalemia   Hypomagnesemia   Hypocalcemia   Macrocytic anemia   Elevated serum hCG   Hyperammonemia ([place])   Cellulitis  Assessment  Stacey Wong is a 40 y.o. female admitted medically 03/16/2021 17:49 PM for complicated EtoH withdrawal.Patient carries the psychiatric diagnoses of Bipolar disorder and has a past medical history of  EtoH use disorder,HTN, asthma, chornic pain 2/2 hip dysplasia .Psychiatry was consulted for cocern for agitation and uncontrolled Bipolar disorder.   She meets criteria for EtOH use disorder based on presentation and hx per EMR and family collateral.  Patient's assessment was hindered by her present delirium; however she does endorse a possible recent manic episode and her husband was able to corroborate behaviors concerning for Bipolar disorder. Outpatient psychotropic medications included lamictal in the past and historically he has had a good response to these medications. She was not compliant with medications prior to admission as evidenced by patient and patinet's husband endorsing that patient has been unmedicated for years . On initial examination, patient appears delirious but attempts to do her best to participate on assessment. We plan to recommend psychotropic treatment regimens for likely Bipolar disorder and will also assist with psychotic symptoms during [facility] withdrawal .   On assessment this AM patient appeared to wax and wane in her level of orientation throughout the assessment and patient's behavior has required PRECEDEX during her withdrawal due to  her increased level of  agitation. Patient endorsed a hx of known dx of Bipolar disorder as well as a FH of this in her father. Patient chart indicates a hx of behavior episodes and EtOh use disorder. Although patient reports good response to lamictal therapy, would like to attempt mood stabilization and management of psychosis symptoms 2/2 withdrawal with Seroquel. Seroquel can be titrated up to recommended maintenance dose (400mg -600mg ) faster than lamictal and will also help with noted AH. Seroquel is also more sedating and patient endorses hx of insomnia outside of manic episodes. May consider restarting lamictal for mood stabilization after patient is titrated off Luminal.   EtoH withdrawal, complicated EtOH use disorder - Detox per primary - Recommend SW consult for rehab or detox options  Hx Bipolar disorder - Increase Seroquel 200mg  QHS - Patient remains on precedex - Recommend SW consult for OP psychiatry options  Safety - At this time patient appears to be of low risk of danger to herself or others. Recommend routine observation.  Dispo - Per primary  Total Time spent with patient: 30 minutes  Subjective:   Stacey Wong is a 40 y.o. female patient admitted with complicated EtoH withdrawal.  HPI:  On assessment this AM patient is A) to person, place, year and situation. Patient was not able to get the month correct but was surprised when reoriented to it being November. Patient's husband was also in the room during assessment.   Patient reported that she has been up for about 5 days and did not drink any EtOH the last 3 days of this 5. Patient was not able to endorse why she suddenly stopped drinking. Patient reported that she has had episodes in the past where  she could go without sleep for a few days, but was not able to give detail about these episodes. Patient denied that she felt more impulsive or irritable. Patient reports that she will normally watch TV when she cannot sleep  for days. Patient reports that she has had lamictal in the past and was stable on this medication.   Patient reports that she does drink frequently and agrees with her husband that she will drink until she passes out but denies that she does this daily. Patient and husband reported that they currently live with her father and are taking care of him. Patient endorsed brief confusion that she and her husband had permanently moved in with her father, but her husband was able to help reorient her. Patient did deny SI, HI and VH. Patient endorsed AH during the assessment reporting that she heard a cat meowing and was confused that no one else heard this.   Collateral, husband: Patient's husband was in the room and intermittently had to clarify patient's statements as patient appeared to be confused at times (ie spoke about her husband and was briefly unable to recognize that he was next to her in the room, talking about her current living situation, or how much she drinks EtoH). Husband reports that he does believe that the patient has had similar episodes of significant decrease in her ability to rest and can get more irritable during these times. He endorsed not knowing why the patient suddenly stopped drinking and reported he did not think she was calling the truth when she stopped because she continued to appear drunk "she was stumbling", but he called 911 when she became unconscious. Husband reports that he has known his wife for sometime, but they have been married almost 9 years. Husband reports that he knows his wife has not had medication for her Bipolar for at least 4 years after losing her provider. He reports that her drinking has worsened over the past 2 years and significantly worsened over the past 2 months. He reports that the patient drinks "until she passes out." He reports she does this almost daily.   Past Psychiatric History: Bipolar disorder, tx with lamictal  Risk to Self:  No Risk to  Others:  NO Prior Inpatient Therapy:  Not assessed Prior Outpatient Therapy:  Yes  Past Medical History:  Past Medical History:  Diagnosis Date   Asthma    Bipolar 1 disorder ([place])    Hypertension     Past Surgical History:  Procedure Laterality Date   CHOLECYSTECTOMY     Family History: History reviewed. No pertinent family history. Family Psychiatric  History: Father, Bipolar disorder, patient cannot recall his medications Social History:  Social History   Substance and Sexual Activity  Alcohol Use Yes   Alcohol/week: 0.0 standard drinks   Comment: occasionally     Social History   Substance and Sexual Activity  Drug Use No    Social History   Socioeconomic History   Marital status: Divorced    Spouse name: Not on file   Number of children: Not on file   Years of education: Not on file   Highest education level: Not on file  Occupational History   Not on file  Tobacco Use   Smoking status: Every Day    Packs/day: 1.00    Types: Cigarettes   Smokeless tobacco: Never   Tobacco comments:    decreased smoking  Substance and Sexual Activity   Alcohol use: Yes  Alcohol/week: 0.0 standard drinks    Comment: occasionally   Drug use: No   Sexual activity: Not on file  Other Topics Concern   Not on file  Social History Narrative   Not on file   Social Determinants of Health   Financial Resource Strain: Not on file  Food Insecurity: Not on file  Transportation Needs: Not on file  Physical Activity: Not on file  Stress: Not on file  Social Connections: Not on file   Additional Social History:    Allergies:  No Known Allergies  Labs:  Results for orders placed or performed during the hospital encounter of 03/16/21 (from the past 48 hour(s))  Glucose, capillary     Status: Abnormal   Collection Time: 03/18/21  4:22 PM  Result Value Ref Range   Glucose-Capillary 113 (H) 70 - 99 mg/dL    Comment: Glucose reference range applies only to samples taken  after fasting for at least 8 hours.  Glucose, capillary     Status: Abnormal   Collection Time: 03/18/21  7:34 PM  Result Value Ref Range   Glucose-Capillary 106 (H) 70 - 99 mg/dL    Comment: Glucose reference range applies only to samples taken after fasting for at least 8 hours.  Glucose, capillary     Status: Abnormal   Collection Time: 03/18/21 11:15 PM  Result Value Ref Range   Glucose-Capillary 104 (H) 70 - 99 mg/dL    Comment: Glucose reference range applies only to samples taken after fasting for at least 8 hours.  Basic metabolic panel     Status: Abnormal   Collection Time: 03/19/21  1:35 AM  Result Value Ref Range   Sodium 134 (L) 135 - 145 mmol/L   Potassium 3.3 (L) 3.5 - 5.1 mmol/L   Chloride 99 98 - 111 mmol/L   CO2 26 22 - 32 mmol/L   Glucose, Bld 96 70 - 99 mg/dL    Comment: Glucose reference range applies only to samples taken after fasting for at least 8 hours.   BUN 5 (L) 6 - 20 mg/dL   Creatinine, Ser 0.45 0.44 - 1.00 mg/dL   Calcium 7.1 (L) 8.9 - 10.3 mg/dL   GFR, Estimated >60 >60 mL/min    Comment: (NOTE) Calculated using the CKD-EPI Creatinine Equation (2021)    Anion gap 9 5 - 15    Comment: Performed at [facility] [address] [place], [place] [address]  CBC     Status: Abnormal   Collection Time: 03/19/21  1:35 AM  Result Value Ref Range   WBC 7.8 4.0 - 10.5 K/uL   RBC 1.81 (L) 3.87 - 5.11 MIL/uL   Hemoglobin 7.4 (L) 12.0 - 15.0 g/dL   HCT 21.7 (L) 36.0 - 46.0 %   MCV 119.9 (H) 80.0 - 100.0 fL   MCH 40.9 (H) 26.0 - 34.0 pg   MCHC 34.1 30.0 - 36.0 g/dL   RDW 21.4 (H) 11.5 - 15.5 %   Platelets 268 150 - 400 K/uL   nRBC 0.0 0.0 - 0.2 %    Comment: Performed at [facility] [address] [place], [place] [address]  Magnesium     Status: Abnormal   Collection Time: 03/19/21  1:35 AM  Result Value Ref Range   Magnesium 1.6 (L) 1.7 - 2.4 mg/dL    Comment: Performed at [facility] [address], [place], [place] [address]   Glucose, capillary  Status: Abnormal   Collection Time: 03/19/21  3:37 AM  Result Value Ref Range   Glucose-Capillary 123 (H) 70 - 99 mg/dL    Comment: Glucose reference range applies only to samples taken after fasting for at least 8 hours.  Glucose, capillary     Status: Abnormal   Collection Time: 03/19/21  8:00 AM  Result Value Ref Range   Glucose-Capillary 105 (H) 70 - 99 mg/dL    Comment: Glucose reference range applies only to samples taken after fasting for at least 8 hours.  Glucose, capillary     Status: Abnormal   Collection Time: 03/19/21 11:35 AM  Result Value Ref Range   Glucose-Capillary 103 (H) 70 - 99 mg/dL    Comment: Glucose reference range applies only to samples taken after fasting for at least 8 hours.  Glucose, capillary     Status: Abnormal   Collection Time: 03/19/21  4:01 PM  Result Value Ref Range   Glucose-Capillary 111 (H) 70 - 99 mg/dL    Comment: Glucose reference range applies only to samples taken after fasting for at least 8 hours.  Glucose, capillary     Status: Abnormal   Collection Time: 03/19/21  7:29 PM  Result Value Ref Range   Glucose-Capillary 140 (H) 70 - 99 mg/dL    Comment: Glucose reference range applies only to samples taken after fasting for at least 8 hours.  Glucose, capillary     Status: Abnormal   Collection Time: 03/19/21 11:39 PM  Result Value Ref Range   Glucose-Capillary 115 (H) 70 - 99 mg/dL    Comment: Glucose reference range applies only to samples taken after fasting for at least 8 hours.  Basic metabolic panel     Status: Abnormal   Collection Time: 03/20/21  1:33 AM  Result Value Ref Range   Sodium 133 (L) 135 - 145 mmol/L   Potassium 3.4 (L) 3.5 - 5.1 mmol/L   Chloride 100 98 - 111 mmol/L   CO2 30 22 - 32 mmol/L   Glucose, Bld 115 (H) 70 - 99 mg/dL    Comment: Glucose reference range applies only to samples taken after fasting for at least 8 hours.   BUN <5 (L) 6 - 20 mg/dL   Creatinine, Ser 0.33 (L) 0.44 -  1.00 mg/dL   Calcium 7.4 (L) 8.9 - 10.3 mg/dL   GFR, Estimated >60 >60 mL/min    Comment: (NOTE) Calculated using the CKD-EPI Creatinine Equation (2021)    Anion gap 3 (L) 5 - 15    Comment: Performed at [facility] [address], [place], [place] [address]  CBC     Status: Abnormal   Collection Time: 03/20/21  1:33 AM  Result Value Ref Range   WBC 7.3 4.0 - 10.5 K/uL   RBC 1.66 (L) 3.87 - 5.11 MIL/uL   Hemoglobin 7.0 (L) 12.0 - 15.0 g/dL    Comment: REPEATED TO VERIFY   HCT 19.9 (L) 36.0 - 46.0 %   MCV 119.9 (H) 80.0 - 100.0 fL   MCH 42.2 (H) 26.0 - 34.0 pg   MCHC 35.2 30.0 - 36.0 g/dL   RDW 19.4 (H) 11.5 - 15.5 %   Platelets 238 150 - 400 K/uL   nRBC 0.0 0.0 - 0.2 %    Comment: Performed at [facility], [place] [address], [place], [place] [address]  Glucose, capillary     Status: Abnormal   Collection Time: 03/20/21  3:33 AM  Result Value Ref Range   Glucose-Capillary 104 (H) 70 - 99 mg/dL    Comment: Glucose reference range applies only to samples taken after fasting for at least 8 hours.  Glucose, capillary     Status: None   Collection Time: 03/20/21  7:11 AM  Result Value Ref Range   Glucose-Capillary 94 70 - 99 mg/dL    Comment: Glucose reference range applies only to samples taken after fasting for at least 8 hours.  Glucose, capillary     Status: Abnormal   Collection Time: 03/20/21 11:01 AM  Result Value Ref Range   Glucose-Capillary 125 (H) 70 - 99 mg/dL    Comment: Glucose reference range applies only to samples taken after fasting for at least 8 hours.    Current Facility-Administered Medications  Medication Dose Route Frequency Provider Last Rate Last Admin   Chlorhexidine Gluconate Cloth 2 % PADS 6 each  6 each Topical Q0600 Renee Pain, MD   6 each at 03/20/21 0530   dexmedetomidine (PRECEDEX) 400 MCG/100ML (4 mcg/mL) infusion  0.2-0.7 mcg/kg/hr (Order-Specific) Intravenous Continuous Heloise Purpura, [facility]   Stopped at 03/20/21 0547    dextrose 5 %-0.9 % sodium chloride infusion   Intravenous Continuous Anders Simmonds, MD 50 mL/hr at 03/20/21 1017 Infusion Verify at 03/20/21 0649   docusate sodium (COLACE) capsule 100 mg  100 mg Oral BID PRN Lestine Mount, PA-C       feeding supplement (JEVITY 1.2 CAL) liquid 1,000 mL  1,000 mL Per Tube Continuous Mannam, Praveen, MD 20 mL/hr at 03/20/21 1423 1,000 mL at 51/02/58 5277   folic acid (FOLVITE) tablet 1 mg  1 mg Oral Daily Gaylan Gerold, DO   1 mg at 03/20/21 1039   heparin injection 5,000 Units  5,000 Units Subcutaneous Q8H Nevada Crane M, PA-C   5,000 Units at 03/20/21 1427   insulin aspart (novoLOG) injection 0-9 Units  0-9 Units Subcutaneous Q4H PRN Aslam, Loralyn Freshwater, MD       lip balm (CARMEX) ointment   Topical PRN Spero Geralds, MD       LORazepam (ATIVAN) injection 1-4 mg  1-4 mg Intravenous Q1H PRN [place] Crane M, PA-C   2 mg at 03/19/21 2220   Or   LORazepam (ATIVAN) tablet 1-4 mg  1-4 mg Oral Q1H PRN Nevada Crane M, PA-C       metoprolol tartrate (LOPRESSOR) tablet 12.5 mg  12.5 mg Per Tube BID Harvie Heck, MD   12.5 mg at 03/20/21 1427   multivitamin with minerals tablet 1 tablet  1 tablet Oral Daily Gaylan Gerold, DO   1 tablet at 03/20/21 1039   nicotine (NICODERM CQ - dosed in mg/24 hours) patch 14 mg  14 mg Transdermal Daily Spero Geralds, MD   14 mg at 03/20/21 1054   [START ON 03/21/2021] PHENObarbital (LUMINAL) injection 30 mg  30 mg Intravenous TID Spero Geralds, MD       PHENObarbital (LUMINAL) injection 60 mg  60 mg Intravenous TID Spero Geralds, MD   60 mg at 03/20/21 0935   polyethylene glycol (MIRALAX / GLYCOLAX) packet 17 g  17 g Oral Daily PRN Nevada Crane M, PA-C       QUEtiapine (SEROQUEL) tablet 200 mg  200 mg Oral QHS Damita Dunnings B, MD       tamsulosin (FLOMAX) capsule 0.4 mg  0.4 mg Oral Daily Aslam, Sadia, MD   0.4 mg at 03/20/21 1212   thiamine tablet 100  mg  100 mg Oral Daily Gaylan Gerold, DO       Or   thiamine (B-1)  injection 100 mg  100 mg Intravenous Daily Gaylan Gerold, DO   100 mg at 03/20/21 9597     Psychiatric Specialty Exam:  Presentation  General Appearance: Disheveled  Eye Contact:Minimal  Speech:Slurred  Speech Volume:Decreased  Handedness:No data recorded  Mood and Affect  Mood:Irritable  Affect:Restricted   Thought Process  Thought Processes:Goal Directed  Descriptions of Associations:Circumstantial  Orientation:Partial  Thought Content:Logical  History of Schizophrenia/Schizoaffective disorder:No data recorded Duration of Psychotic Symptoms:No data recorded Hallucinations:Hallucinations: Auditory Description of Auditory Hallucinations: "i hear the cat meowing."  Ideas of Reference:None  Suicidal Thoughts:Suicidal Thoughts: No  Homicidal Thoughts:Homicidal Thoughts: No   Sensorium  Memory:Immediate Poor; Recent Fair; Remote Fair  Judgment:Impaired  Insight:None   Executive Functions  Concentration:Poor  Attention Span:Poor  Recall:No data recorded Fund of Knowledge:Poor  Language:Fair   Psychomotor Activity  Psychomotor Activity:Psychomotor Activity: Decreased   Assets  Assets:Housing; Social Support; Desire for Improvement   Sleep  Sleep:Sleep: Poor   Physical Exam: Physical Exam HENT:     Head: Normocephalic and atraumatic.  Neurological:     Mental Status: She is alert. She is disoriented.     Comments: Oriented to person, place, ans situation not oriented to month but knew the year   ROS Blood pressure 129/89, pulse (!) 125, temperature 99.3 F (37.4 C), resp. rate 17, height 5\' 4"  (1.626 m), weight 60.9 kg, SpO2 98 %. Body mass index is 23.05 kg/m.  PGY-2  Freida Busman, MD 03/20/2021 3:34 PM

## 2021-03-20 NOTE — Progress Notes (Signed)
NAME:  Stacey Wong, MRN:  [id number], DOB:  04/01/1981, LOS: 4 ADMISSION DATE:  03/16/2021 CONSULTATION DATE:  03/16/2021 REFERRING MD:  Graciella Freer CHIEF COMPLAINT:  EtOH Withdrawal   History of Present Illness:  Stacey Wong is seen in consultation at the request of IMTS for recommendations on further evaluation and management of AMS and presumed EtOH withdrawal.  40 year old woman who [facility] ED 11/18 via EMS for hypoglycemia, AMS and concern for EtOH withdrawal. PMHx significant for HTN, asthma, BPD (not on medications), hip dysplasia with associated chronic pain and EtOH abuse. Per patient's mother (at bedside), she has been "self-medicating" her pain with alcohol for many years; stopped for a short time a few years ago but resumed drinking heavily in 2020. Per mother, patient drinks about a gallon of brown liquor over three days. She had not drank alcohol in a few days due to not feeling well and began to have AMS with hallucinations, prompting EMS contact.  On EMS arrival, was hypoglycemic to 60s and hallucinating. D10 was administered with improvement in CBG to 200s; however; on ED arrival patient was again hypoglycemic to 30s.She was reportedly biting her hands and unable to follow commands from ED staff. Ongoing combativeness and hallucinations in the setting of likely EtOH withdrawal prompting initiation of CIWA protocol (Ativan). Additionally, Haldol and Geodon were administered and soft restraints were used. Patient had multiple lab abnormalities including electrolyte derangements with Na 122, K 2.3; mild AKI with Cr 1.01 (baseline 0.6) and WBC 20K. Of note, quantitative blood beta HCG was low positive (6.9) and this was repeated. CT Head was negative for acute intracranial abnormalities.   As patient began to wake, she became hypertensive and remained confused/combative prompting PCCM consult/ICU admission for Precedex initiation.  Pertinent Medical History:   Past Medical History:   Diagnosis Date   Asthma    Bipolar 1 disorder ([place])    Hypertension   Hip dysplasia Chronic pain EtOH abuse  Significant Hospital Events: Including procedures, antibiotic start and stop dates in addition to other pertinent events   11/18 - Presented to ED for AMS, hypoglycemia and c/f EtOH withdrawal. Admitted to ICU for Precedex/CIWA protocol. 11/19 hypoglycemia overnight  Interim History / Subjective:  Overnight, continued on precedex gtt, ativan q4h and seroquel overnight. This morning, in soft restraints; off precedex gtt, awake and alert.   Objective:  Blood pressure 117/87, pulse 87, temperature 97.8 F (36.6 C), temperature source Axillary, resp. rate 19, weight 60.9 kg, SpO2 100 %.        Intake/Output Summary (Last 24 hours) at 03/20/2021 0710 Last data filed at 03/19/2021 2300 Gross per 24 hour  Intake 1200.17 ml  Output 905 ml  Net 295.17 ml   Filed Weights   03/18/21 0441 03/19/21 0500 03/20/21 0500  Weight: 59.1 kg 60 kg 60.9 kg    Physical Examination: General: acutely and chronically ill appearing, older than stated age 72: EOMI, MMM, right anterior auricular excoriation with dried blood  Neuro: awake and alert, spontaneously moves all 4 extremities CV: RRR, no m/r/g PULM: breathing non labored, clear to auscultation GI: soft, nontender, nondistended, +BS Extremities: no edema Skin: warm dry, bruises  Resolved Hospital Problem List:  Mild AKI Hypokalemia  Assessment & Plan:  Acute EtOH withdrawal with delirium Toxic metabolic encephalopathy Currently off precedex; continue for goal RASS 0 to -1 - continue phenobarbital taper - Increase seroquel to 100mg  qHS - CIWA protocol; q4 hours - Thiamine, folate, MV - Correct electrolyte derangements  as below  Hypoglycemia - improved - Hypoglycemia protocol in place - Not tolerating much PO intake; initiate tube feeds  - CBG q4h with SSI sensitive  prn  Hyponatremia Hypomagnesemia Hypocalcemia - Replete electrolytes as indicated  - Monitor I&Os - Avoid nephrotoxic agents as able - Ensure adequate renal perfusion - high risk for refeeding syndrome  Hip dysplasia Chronic pain EtOH abuse History of chronic hip dysplasia per mother; self-medicates with EtOH. - Pain management - Encourage EtOH cessation/provide resources once able to participate in care  Bipolar disorder Not on any medications. - Psychiatry consult   Urinary retention Required foley after multiple I/O's.  - Start flomax x3 days - Voiding trial following this   Best Practice: (right click and "Reselect all SmartList Selections" daily)   Diet/type: tubefeeds DVT prophylaxis: prophylactic heparin  GI prophylaxis: N/A Lines: N/A Foley:  Yes, still needed Code Status:  full code Last date of multidisciplinary goals of care discussion [11/21 updated husband]  Labs:  CBC: Recent Labs  Lab 03/16/21 1249 03/17/21 1144 03/18/21 0317 03/19/21 0135 03/20/21 0133  WBC 20.6* 17.1* 11.9* 7.8 7.3  NEUTROABS 19.0*  --   --   --   --   HGB 9.8* 8.0* 7.6* 7.4* 7.0*  HCT 27.1* 22.6* 21.8* 21.7* 19.9*  MCV 112.0* 113.0* 119.8* 119.9* 119.9*  PLT 338 338 277 268 161   Basic Metabolic Panel: Recent Labs  Lab 03/16/21 1912 03/16/21 1916 03/17/21 0416 03/17/21 1144 03/17/21 1912 03/18/21 0317 03/19/21 0135 03/20/21 0133  NA  --    < > 122*  --  132* 134* 134* 133*  K  --    < > 3.1*  --  3.3* 3.5 3.3* 3.4*  CL  --    < > 82*  --  94* 95* 99 100  CO2  --    < > 23  --  25 25 26 30   GLUCOSE  --    < > 83  --  84 86 96 115*  BUN  --    < > 17  --  14 13 5* <5*  CREATININE  --    < > 0.91  --  0.64 0.64 0.45 0.33*  CALCIUM  --    < > 6.0*  --  6.3* 6.9* 7.1* 7.4*  MG 0.8*  --   --  2.2  --  1.8 1.6*  --   PHOS 4.9*  --  4.5  --   --   --   --   --    < > = values in this interval not displayed.   GFR: CrCl cannot be calculated (Unknown ideal  weight.). Recent Labs  Lab 03/17/21 1144 03/18/21 0317 03/19/21 0135 03/20/21 0133  WBC 17.1* 11.9* 7.8 7.3   Liver Function Tests: Recent Labs  Lab 03/16/21 1249 03/17/21 0416 03/18/21 0317  AST <5* 182* 147*  ALT <5 38 42  ALKPHOS 438* 292* 251*  BILITOT 5.9* QUANTITY NOT SUFFICIENT, UNABLE TO PERFORM TEST 1.7*  PROT 5.1* 4.0* 3.7*  ALBUMIN 2.3* 1.8* 1.6*   No results for input(s): LIPASE, AMYLASE in the last 168 hours. Recent Labs  Lab 03/16/21 1249  AMMONIA 107*   ABG:    Component Value Date/Time   TCO2 24 03/31/2011 1335    Coagulation Profile: No results for input(s): INR, PROTIME in the last 168 hours.  Cardiac Enzymes: No results for input(s): CKTOTAL, CKMB, CKMBINDEX, TROPONINI in the last 168 hours.  HbA1C: No results  found for: HGBA1C  CBG: Recent Labs  Lab 03/19/21 1135 03/19/21 1601 03/19/21 1929 03/19/21 2339 03/20/21 0333  GLUCAP 103* 111* 140* 115* 104*      Harvie Heck, MD Internal Medicine, PGY-3 03/20/21 7:10 AM Pager # [phone]  If no response to pager , please call critical care on call (see AMION) until 7pm After 7:00 pm call Elink

## 2021-03-20 NOTE — Progress Notes (Signed)
[facility] ADULT ICU REPLACEMENT PROTOCOL   The patient does apply for the [facility] Adult ICU Electrolyte Replacment Protocol based on the criteria listed below:   1.Exclusion criteria: TCTS patients, ECMO patients, and Dialysis patients 2. Is GFR >/= 30 ml/min? Yes.    Patient's GFR today is >60 3. Is SCr </= 2? Yes.   Patient's SCr is 0.33 mg/dL 4. Did SCr increase >/= 0.5 in 24 hours? No. 5.Pt's weight >40kg  Yes.   6. Abnormal electrolyte(s):  K 3.4  7. Electrolytes replaced per protocol 8.  Call MD STAT for K+ </= 2.5, Phos </= 1, or Mag </= 1 Physician:  Braulio Conte R Ronie Barnhart 03/20/2021 3:01 AM

## 2021-03-21 DIAGNOSIS — F3164 Bipolar disorder, current episode mixed, severe, with psychotic features: Secondary | ICD-10-CM

## 2021-03-21 LAB — BASIC METABOLIC PANEL
Anion gap: 4 — ABNORMAL LOW (ref 5–15)
BUN: <5 mg/dL — ABNORMAL LOW (ref 6–20)
CO2: 26 mmol/L (ref 22–32)
Calcium: 8.1 mg/dL — ABNORMAL LOW (ref 8.9–10.3)
Chloride: 101 mmol/L (ref 98–111)
Creatinine, Ser: 0.31 mg/dL — ABNORMAL LOW (ref 0.44–1.00)
GFR, Estimated: >60 mL/min (ref >60)
Glucose, Bld: 103 mg/dL — ABNORMAL HIGH (ref 70–99)
Potassium: 4.8 mmol/L (ref 3.5–5.1)
Sodium: 131 mmol/L — ABNORMAL LOW (ref 135–145)

## 2021-03-21 LAB — CBC
HCT: 24.3 % — ABNORMAL LOW (ref 36.0–46.0)
Hemoglobin: 8.4 g/dL — ABNORMAL LOW (ref 12.0–15.0)
MCH: 41.6 pg — ABNORMAL HIGH (ref 26.0–34.0)
MCHC: 34.6 g/dL (ref 30.0–36.0)
MCV: 120.3 fL — ABNORMAL HIGH (ref 80.0–100.0)
Platelets: 294 10*3/uL (ref 150–400)
RBC: 2.02 MIL/uL — ABNORMAL LOW (ref 3.87–5.11)
RDW: 18.8 % — ABNORMAL HIGH (ref 11.5–15.5)
WBC: 7.4 10*3/uL (ref 4.0–10.5)
nRBC: 0 % (ref 0.0–0.2)

## 2021-03-21 LAB — PHOSPHORUS
Phosphorus: 1.7 mg/dL — ABNORMAL LOW (ref 2.5–4.6)
Phosphorus: 4.3 mg/dL (ref 2.5–4.6)

## 2021-03-21 LAB — GLUCOSE, CAPILLARY
Glucose-Capillary: 103 mg/dL — ABNORMAL HIGH (ref 70–99)
Glucose-Capillary: 123 mg/dL — ABNORMAL HIGH (ref 70–99)
Glucose-Capillary: 123 mg/dL — ABNORMAL HIGH (ref 70–99)
Glucose-Capillary: 125 mg/dL — ABNORMAL HIGH (ref 70–99)
Glucose-Capillary: 136 mg/dL — ABNORMAL HIGH (ref 70–99)
Glucose-Capillary: 94 mg/dL (ref 70–99)

## 2021-03-21 LAB — MAGNESIUM
Magnesium: 1.3 mg/dL — ABNORMAL LOW (ref 1.7–2.4)
Magnesium: 2.2 mg/dL (ref 1.7–2.4)

## 2021-03-21 MED ORDER — POLYETHYLENE GLYCOL 3350 17 G PO PACK
17.0000 g | PACK | Freq: Every day | ORAL | Status: DC
  Administered 2021-03-21 – 2021-03-24 (×4): 17 g via ORAL
  Filled 2021-03-21 (×4): qty 1

## 2021-03-21 MED ORDER — NICOTINE 21 MG/24HR TD PT24
21.0000 mg | MEDICATED_PATCH | Freq: Every day | TRANSDERMAL | Status: DC
  Administered 2021-03-22 – 2021-03-26 (×5): 21 mg via TRANSDERMAL
  Filled 2021-03-21 (×5): qty 1

## 2021-03-21 MED ORDER — DOCUSATE SODIUM 100 MG PO CAPS
100.0000 mg | ORAL_CAPSULE | Freq: Two times a day (BID) | ORAL | Status: DC
  Administered 2021-03-22 – 2021-03-25 (×8): 100 mg via ORAL
  Filled 2021-03-21 (×9): qty 1

## 2021-03-21 MED ORDER — THIAMINE HCL 100 MG PO TABS: 100.0000 mg | ORAL_TABLET | Freq: Every day | ORAL | Status: DC

## 2021-03-21 MED ORDER — SODIUM PHOSPHATES 45 MMOLE/15ML IV SOLN
30.0000 mmol | Freq: Once | INTRAVENOUS | Status: AC
  Administered 2021-03-21: 30 mmol via INTRAVENOUS
  Filled 2021-03-21: qty 10

## 2021-03-21 MED ORDER — QUETIAPINE FUMARATE 50 MG PO TABS
50.0000 mg | ORAL_TABLET | Freq: Every day | ORAL | Status: DC
  Administered 2021-03-21 – 2021-03-26 (×6): 50 mg via ORAL
  Filled 2021-03-21 (×6): qty 1

## 2021-03-21 MED ORDER — MAGNESIUM SULFATE 2 GM/50ML IV SOLN
2.0000 g | Freq: Once | INTRAVENOUS | Status: AC
  Administered 2021-03-21: 2 g via INTRAVENOUS
  Filled 2021-03-21: qty 50

## 2021-03-21 MED ORDER — THIAMINE HCL 100 MG/ML IJ SOLN
400.0000 mg | Freq: Three times a day (TID) | INTRAVENOUS | Status: AC
  Administered 2021-03-21 – 2021-03-24 (×9): 400 mg via INTRAVENOUS
  Filled 2021-03-21 (×10): qty 4

## 2021-03-21 MED ORDER — QUETIAPINE FUMARATE 100 MG PO TABS
200.0000 mg | ORAL_TABLET | Freq: Every day | ORAL | Status: DC
  Administered 2021-03-21 – 2021-03-25 (×5): 200 mg
  Filled 2021-03-21 (×5): qty 2

## 2021-03-21 NOTE — Plan of Care (Signed)
  Problem: Safety: Goal: Non-violent Restraint(s) Outcome: Not Progressing   

## 2021-03-21 NOTE — Consult Note (Signed)
[facility] Face-to-Face Psychiatry Consult   Reason for Consult:   Alcohol withdrawal agitation, Hx of Bipolar disorder, currently untreated Referring Physician:   Marshell Garfinkel, MD Patient Identification: Stacey Wong MRN:  [id number] Principal Diagnosis: Alcohol withdrawal ([place]) Diagnosis:  Principal Problem:   Alcohol withdrawal ([place]) Active Problems:   Bipolar disorder ([place])   Asthma   Panic disorder   Hypokalemia   Hypomagnesemia   Hypocalcemia   Macrocytic anemia   Elevated serum hCG   Hyperammonemia ([place])   Cellulitis  Assessment  RUBA OUTEN is a 40 y.o. 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   She meets criteria for EtOH use disorder based on presentation and hx per EMR and family collateral.  Patient's assessment was hindered by her present delirium; however she does endorse a possible recent manic episode and her husband was able to corroborate behaviors concerning for Bipolar disorder. Outpatient psychotropic medications included lamictal in the past and historically he has had a good response to these medications. She was not compliant with medications prior to admission as evidenced by patient and patinet's husband endorsing that patient has been unmedicated for years . On initial examination, patient appears delirious but attempts to do her best to participate on assessment. We plan to recommend psychotropic treatment regimens for likely Bipolar disorder and will also assist with psychotic symptoms during [facility] withdrawal .    Patient appeared sedated on assessment today, but attempted to the best of her abilities to interact. Patient continues to have behavior episodes requiring Ativan likely 2/2 to her  withdrawal. However, patient appeared to respond well to seroquel as she had no night time episodes and was noted to rest well. At this time will continue to treat behavior and for bipolar disorder with Seroquel at this time. Discussed with patient, patient's husband, as well as primary team that patient may benefit from mood stabilizer in the long term. However lamictal requires time for a slow upward titration whereas depakote can be weight based loaded. However, patient is not currently on contraception and we are less likely to start a medication in patient who is of child bearing age, who is still menstruating. Patient was not able to have a meaningful conversation regarding the decision to start contraception at this time, but made it clear she does wish to have children anytime soon. This medication option can be reproached in the future.   EtoH withdrawal, complicated EtOH use disorder - Detox per primary - Recommend SW consult for rehab or detox options   Hx Bipolar disorder - Recommend increase Seroquel 50mg  daily 200mg  QHS - Patient remains on precedex - Recommend SW consult for OP psychiatry options (likely [facility] in [facility])  Tobacco use disorder - Recommend increasing nicotine patch dosage, patient's husband endorsed patient smokes 2ppd and patient did not deny   Safety - At this time patient appears to be of low risk of danger to herself or others. Recommend routine observation.   Dispo - Per primary  Total Time spent with patient: 20 minutes  Subjective:   Stacey Wong is a 40 y.o. female patient admitted with EtoH withdrawal.  HPI:  Attempted to see patietn this AM; however patient had recently received PRN Ativan due to agitation and RN reported that she  had been concerned that patient would harm herself.  Saw patient again in the PM. Patient's husband was in the room at this time. RN reports that patient had been drowsy since receiving the Ativan but was more  alert than earlier. Patient was Aox4 despite having to be aroused multiple times through out assessment. Patient denied SI, HI and AVH. Patient reported that she had been drinking "a lot" prior to suddenly stopping approx 3 days before her presentation.   Patient RN reported that they felt that the Seroquel was working well for patient during the night.   Discussed with patient and patient's husband treatment options for behavior and Bipolar disorder. Patient reported she had no plans for pregnancy in the future. Husband reported that he was interested in making sure the patient had f/u for her Bipolar at discharge and endorsed making sure she would have what she needed.  Past Psychiatric History: Bipolar disorder, tx with lamictal  Past Medical History:  Past Medical History:  Diagnosis Date   Asthma    Bipolar 1 disorder ([place])    Hypertension     Past Surgical History:  Procedure Laterality Date   CHOLECYSTECTOMY     Family History: History reviewed. No pertinent family history. Family Psychiatric  History: Father, Bipolar disorder, patient cannot recall his medications Social History:  Social History   Substance and Sexual Activity  Alcohol Use Yes   Alcohol/week: 0.0 standard drinks   Comment: occasionally     Social History   Substance and Sexual Activity  Drug Use No    Social History   Socioeconomic History   Marital status: Divorced    Spouse name: Not on file   Number of children: Not on file   Years of education: Not on file   Highest education level: Not on file  Occupational History   Not on file  Tobacco Use   Smoking status: Every Day    Packs/day: 1.00    Types: Cigarettes   Smokeless tobacco: Never   Tobacco comments:    decreased smoking  Substance and Sexual Activity   Alcohol use: Yes    Alcohol/week: 0.0 standard drinks    Comment: occasionally   Drug use: No   Sexual activity: Not on file  Other Topics Concern   Not on file  Social  History Narrative   Not on file   Social Determinants of Health   Financial Resource Strain: Not on file  Food Insecurity: Not on file  Transportation Needs: Not on file  Physical Activity: Not on file  Stress: Not on file  Social Connections: Not on file   Additional Social History:    Allergies:  No Known Allergies  Labs:  Results for orders placed or performed during the hospital encounter of 03/16/21 (from the past 48 hour(s))  Glucose, capillary     Status: Abnormal   Collection Time: 03/19/21  4:01 PM  Result Value Ref Range   Glucose-Capillary 111 (H) 70 - 99 mg/dL    Comment: Glucose reference range applies only to samples taken after fasting for at least 8 hours.  Glucose, capillary     Status: Abnormal   Collection Time: 03/19/21  7:29 PM  Result Value Ref Range   Glucose-Capillary 140 (H) 70 - 99 mg/dL    Comment: Glucose reference range applies only to samples taken after fasting for at least 8 hours.  Glucose, capillary     Status: Abnormal   Collection Time: 03/19/21 11:39 PM  Result  Value Ref Range   Glucose-Capillary 115 (H) 70 - 99 mg/dL    Comment: Glucose reference range applies only to samples taken after fasting for at least 8 hours.  Basic metabolic panel     Status: Abnormal   Collection Time: 03/20/21  1:33 AM  Result Value Ref Range   Sodium 133 (L) 135 - 145 mmol/L   Potassium 3.4 (L) 3.5 - 5.1 mmol/L   Chloride 100 98 - 111 mmol/L   CO2 30 22 - 32 mmol/L   Glucose, Bld 115 (H) 70 - 99 mg/dL    Comment: Glucose reference range applies only to samples taken after fasting for at least 8 hours.   BUN <5 (L) 6 - 20 mg/dL   Creatinine, Ser 0.33 (L) 0.44 - 1.00 mg/dL   Calcium 7.4 (L) 8.9 - 10.3 mg/dL   GFR, Estimated >60 >60 mL/min    Comment: (NOTE) Calculated using the CKD-EPI Creatinine Equation (2021)    Anion gap 3 (L) 5 - 15    Comment: Performed at [facility] [address], [place], [place] [address]  CBC     Status: Abnormal    Collection Time: 03/20/21  1:33 AM  Result Value Ref Range   WBC 7.3 4.0 - 10.5 K/uL   RBC 1.66 (L) 3.87 - 5.11 MIL/uL   Hemoglobin 7.0 (L) 12.0 - 15.0 g/dL    Comment: REPEATED TO VERIFY   HCT 19.9 (L) 36.0 - 46.0 %   MCV 119.9 (H) 80.0 - 100.0 fL   MCH 42.2 (H) 26.0 - 34.0 pg   MCHC 35.2 30.0 - 36.0 g/dL   RDW 19.4 (H) 11.5 - 15.5 %   Platelets 238 150 - 400 K/uL   nRBC 0.0 0.0 - 0.2 %    Comment: Performed at [facility], [place] [address], [place], [place] [address]  Glucose, capillary     Status: Abnormal   Collection Time: 03/20/21  3:33 AM  Result Value Ref Range   Glucose-Capillary 104 (H) 70 - 99 mg/dL    Comment: Glucose reference range applies only to samples taken after fasting for at least 8 hours.  Glucose, capillary     Status: None   Collection Time: 03/20/21  7:11 AM  Result Value Ref Range   Glucose-Capillary 94 70 - 99 mg/dL    Comment: Glucose reference range applies only to samples taken after fasting for at least 8 hours.  Glucose, capillary     Status: Abnormal   Collection Time: 03/20/21 11:01 AM  Result Value Ref Range   Glucose-Capillary 125 (H) 70 - 99 mg/dL    Comment: Glucose reference range applies only to samples taken after fasting for at least 8 hours.  Glucose, capillary     Status: Abnormal   Collection Time: 03/20/21  3:36 PM  Result Value Ref Range   Glucose-Capillary 135 (H) 70 - 99 mg/dL    Comment: Glucose reference range applies only to samples taken after fasting for at least 8 hours.  Magnesium     Status: Abnormal   Collection Time: 03/20/21  4:52 PM  Result Value Ref Range   Magnesium 1.4 (L) 1.7 - 2.4 mg/dL    Comment: Performed at [facility] [address], [place],  [address]  Phosphorus     Status: Abnormal   Collection Time: 03/20/21  4:52 PM  Result Value Ref Range   Phosphorus 1.1 (L) 2.5 - 4.6 mg/dL  Comment: Performed at [facility], [place] [address], [place], [place] [address]  Glucose,  capillary     Status: Abnormal   Collection Time: 03/20/21  7:17 PM  Result Value Ref Range   Glucose-Capillary 130 (H) 70 - 99 mg/dL    Comment: Glucose reference range applies only to samples taken after fasting for at least 8 hours.  Glucose, capillary     Status: Abnormal   Collection Time: 03/20/21 11:42 PM  Result Value Ref Range   Glucose-Capillary 114 (H) 70 - 99 mg/dL    Comment: Glucose reference range applies only to samples taken after fasting for at least 8 hours.  Glucose, capillary     Status: Abnormal   Collection Time: 03/21/21  3:25 AM  Result Value Ref Range   Glucose-Capillary 125 (H) 70 - 99 mg/dL    Comment: Glucose reference range applies only to samples taken after fasting for at least 8 hours.  Basic metabolic panel     Status: Abnormal   Collection Time: 03/21/21  6:19 AM  Result Value Ref Range   Sodium 131 (L) 135 - 145 mmol/L   Potassium 4.8 3.5 - 5.1 mmol/L   Chloride 101 98 - 111 mmol/L   CO2 26 22 - 32 mmol/L   Glucose, Bld 103 (H) 70 - 99 mg/dL    Comment: Glucose reference range applies only to samples taken after fasting for at least 8 hours.   BUN <5 (L) 6 - 20 mg/dL   Creatinine, Ser 0.31 (L) 0.44 - 1.00 mg/dL   Calcium 8.1 (L) 8.9 - 10.3 mg/dL   GFR, Estimated >60 >60 mL/min    Comment: (NOTE) Calculated using the CKD-EPI Creatinine Equation (2021)    Anion gap 4 (L) 5 - 15    Comment: Performed at [facility] [address] [place], [place] [address]  CBC     Status: Abnormal   Collection Time: 03/21/21  6:19 AM  Result Value Ref Range   WBC 7.4 4.0 - 10.5 K/uL   RBC 2.02 (L) 3.87 - 5.11 MIL/uL   Hemoglobin 8.4 (L) 12.0 - 15.0 g/dL   HCT 24.3 (L) 36.0 - 46.0 %   MCV 120.3 (H) 80.0 - 100.0 fL   MCH 41.6 (H) 26.0 - 34.0 pg   MCHC 34.6 30.0 - 36.0 g/dL   RDW 18.8 (H) 11.5 - 15.5 %   Platelets 294 150 - 400 K/uL   nRBC 0.0 0.0 - 0.2 %    Comment: Performed at [facility] [address] [place], [place] [address]   Magnesium     Status: Abnormal   Collection Time: 03/21/21  6:19 AM  Result Value Ref Range   Magnesium 1.3 (L) 1.7 - 2.4 mg/dL    Comment: Performed at [facility] [address] [place],  [address]  Phosphorus     Status: Abnormal   Collection Time: 03/21/21  6:19 AM  Result Value Ref Range   Phosphorus 1.7 (L) 2.5 - 4.6 mg/dL    Comment: Performed at [facility] [address], [place], [place] [address]  Glucose, capillary     Status: Abnormal   Collection Time: 03/21/21  7:53 AM  Result Value Ref Range   Glucose-Capillary 136 (H) 70 - 99 mg/dL    Comment: Glucose reference range applies only to samples taken after fasting for at least 8 hours.  Glucose, capillary     Status:  None   Collection Time: 03/21/21 11:32 AM  Result Value Ref Range   Glucose-Capillary 94 70 - 99 mg/dL    Comment: Glucose reference range applies only to samples taken after fasting for at least 8 hours.    Current Facility-Administered Medications  Medication Dose Route Frequency Provider Last Rate Last Admin   chlorhexidine (PERIDEX) 0.12 % solution 15 mL  15 mL Mouth Rinse BID Aslam, Sadia, MD   15 mL at 03/21/21 0951   Chlorhexidine Gluconate Cloth 2 % PADS 6 each  6 each Topical Q0600 Renee Pain, MD   6 each at 03/20/21 2100   dexmedetomidine (PRECEDEX) 400 MCG/100ML (4 mcg/mL) infusion  0.2-0.7 mcg/kg/hr (Order-Specific) Intravenous Continuous Heloise Purpura, [facility]   Stopped at 03/20/21 0547   docusate sodium (COLACE) capsule 100 mg  100 mg Oral BID Marianna Payment, MD       feeding supplement (JEVITY 1.2 CAL) liquid 1,000 mL  1,000 mL Per Tube Continuous Mannam, Praveen, MD 40 mL/hr at 03/21/21 0316 Rate Change at 10/27/14 0109   folic acid (FOLVITE) tablet 1 mg  1 mg Oral Daily Gaylan Gerold, DO   1 mg at 03/21/21 0910   heparin injection 5,000 Units  5,000 Units Subcutaneous Q8H Nevada Crane M, PA-C   5,000 Units at 03/21/21 0545   insulin aspart (novoLOG) injection  0-9 Units  0-9 Units Subcutaneous Q4H PRN Aslam, Loralyn Freshwater, MD       lip balm (CARMEX) ointment   Topical PRN Spero Geralds, MD       LORazepam (ATIVAN) injection 1-4 mg  1-4 mg Intravenous Q1H PRN [place] Crane M, PA-C   4 mg at 03/21/21 3235   Or   LORazepam (ATIVAN) tablet 1-4 mg  1-4 mg Oral Q1H PRN Lestine Mount, PA-C       MEDLINE mouth rinse  15 mL Mouth Rinse q12n4p Aslam, Loralyn Freshwater, MD   15 mL at 03/21/21 1132   metoprolol tartrate (LOPRESSOR) tablet 12.5 mg  12.5 mg Per Tube BID Harvie Heck, MD   12.5 mg at 03/21/21 0910   multivitamin with minerals tablet 1 tablet  1 tablet Oral Daily Gaylan Gerold, DO   1 tablet at 03/21/21 0910   nicotine (NICODERM CQ - dosed in mg/24 hours) patch 14 mg  14 mg Transdermal Daily Spero Geralds, MD   14 mg at 03/21/21 0912   PHENObarbital (LUMINAL) injection 30 mg  30 mg Intravenous TID Spero Geralds, MD   30 mg at 03/21/21 0906   polyethylene glycol (MIRALAX / GLYCOLAX) packet 17 g  17 g Oral Daily Marianna Payment, MD   17 g at 03/21/21 1128   QUEtiapine (SEROQUEL) tablet 200 mg  200 mg Per Tube QHS Mannam, Praveen, MD       sodium phosphate 30 mmol in dextrose 5 % 250 mL infusion  30 mmol Intravenous Once Marianna Payment, MD       tamsulosin [facility]) capsule 0.4 mg  0.4 mg Oral Daily Aslam, Sadia, MD   0.4 mg at 03/21/21 0910   thiamine tablet 100 mg  100 mg Oral Daily Gaylan Gerold, DO       Or   thiamine (B-1) injection 100 mg  100 mg Intravenous Daily Gaylan Gerold, DO   100 mg at 03/21/21 5732      Psychiatric Specialty Exam:  Presentation  General Appearance: Disheveled  Eye Contact:Minimal  Speech:Slurred  Speech Volume:Decreased  Handedness:No data recorded  Mood and Affect  Mood:Irritable  Affect:Restricted   Thought Process  Thought Processes:Goal Directed  Descriptions of Associations:Circumstantial  Orientation:Partial  Thought Content:Logical  History of Schizophrenia/Schizoaffective disorder:No data  recorded Duration of Psychotic Symptoms:No data recorded Hallucinations:Hallucinations: Auditory Description of Auditory Hallucinations: "i hear the cat meowing."  Ideas of Reference:None  Suicidal Thoughts:Suicidal Thoughts: No  Homicidal Thoughts:Homicidal Thoughts: No   Sensorium  Memory:Immediate Poor; Recent Fair; Remote Fair  Judgment:Impaired  Insight:None   Executive Functions  Concentration:Poor  Attention Span:Poor  Recall:No data recorded Fund of Knowledge:Poor  Language:Fair   Psychomotor Activity  Psychomotor Activity:Psychomotor Activity: Decreased   Assets  Assets:Housing; Social Support; Desire for Improvement   Sleep  Sleep:Sleep: Poor   Physical Exam: Physical Exam Constitutional:      Comments: Drowsy intermittently able to interact but falls back asleep and required frequent reawakening  HENT:     Head: Normocephalic and atraumatic.  Pulmonary:     Effort: Pulmonary effort is normal.  Neurological:     Mental Status: She is oriented to person, place, and time.   Review of Systems  Psychiatric/Behavioral:  Positive for substance abuse. Negative for hallucinations and suicidal ideas.   Blood pressure 130/78, pulse (!) 117, temperature 98.2 F (36.8 C), temperature source Axillary, resp. rate (!) 25, height 5\' 4"  (1.626 m), weight 57.7 kg, SpO2 96 %. Body mass index is 21.83 kg/m.   PGY-2 Freida Busman, MD 03/21/2021 12:45 PM

## 2021-03-21 NOTE — Progress Notes (Addendum)
HD#5 SUBJECTIVE:  Patient Summary: Stacey Wong is a 40 y.o. with a pertinent PMH of bipolar/behavioral health disorder, anemia, hypertension, chronic pain syndrome, alcohol use disorder,, who presented with altered mental status in the setting of acute alcohol withdrawal.  Overnight Events: no events.   Interim History: Patient resting in bed.  She remains sedated and was not able to open her eyes or verbally participate with exam.  She was able to follow simple commands including raising her arms and legs.  OBJECTIVE:  Vital Signs: Vitals:   03/21/21 0400 03/21/21 0430 03/21/21 0500 03/21/21 0530  BP: (!) 139/100 (!) 131/94 (!) 137/100 (!) 138/103  Pulse: (!) 113 (!) 112 (!) 111 (!) 113  Resp: (!) 24 17 17  (!) 24  Temp:      TempSrc:      SpO2: 100% 100% 99% 100%  Weight:   57.7 kg   Height:       Supplemental O2: Room Air SpO2: 100 % O2 Flow Rate (L/min): 2 L/min  Filed Weights   03/19/21 0500 03/20/21 0500 03/21/21 0500  Weight: 60 kg 60.9 kg 57.7 kg     Intake/Output Summary (Last 24 hours) at 03/21/2021 7209 Last data filed at 03/21/2021 0544 Gross per 24 hour  Intake 1474.4 ml  Output 3475 ml  Net -2000.6 ml   Net IO Since Admission: 928.73 mL [03/21/21 0619]  Physical Exam: Physical Exam Constitutional:      Appearance: She is ill-appearing.     Comments: Sedated but following simple commands appropriately  HENT:     Head: Normocephalic and atraumatic.  Cardiovascular:     Rate and Rhythm: Tachycardia present.     Pulses: Normal pulses.     Heart sounds: Normal heart sounds. No murmur heard. Pulmonary:     Effort: Pulmonary effort is normal.     Breath sounds: Normal breath sounds.  Abdominal:     General: Bowel sounds are normal. There is no distension.     Palpations: Abdomen is soft.     Tenderness: There is no abdominal tenderness.  Musculoskeletal:        General: No swelling. Normal range of motion.     Cervical back: Normal range of  motion. No rigidity.  Skin:    General: Skin is warm and dry.  Neurological:     Mental Status: She is disoriented.     Motor: No weakness.  Psychiatric:        Attention and Perception: She perceives visual hallucinations.        Mood and Affect: Affect is labile.        Thought Content: Thought content is paranoid and delusional.    Patient Lines/Drains/Airways Status     Active Line/Drains/Airways     Name Placement date Placement time Site Days   Peripheral IV 03/20/21 20 G 1" Left Antecubital 03/20/21  0700  Antecubital  1   Peripheral IV 03/20/21 20 G 2.5" Left;Upper Arm 03/20/21  0705  Arm  1   Urethral Catheter Berenice Bouton RN Temperature probe 14 Fr. 03/17/21  2330  Temperature probe  4   Small Bore Feeding Tube 10 Fr. Left nare Marking at nare/corner of mouth 65 cm 03/19/21  1041  Left nare  2   Pressure Injury 03/17/21 Sacrum Mid;Upper Deep Tissue Pressure Injury - Purple or maroon localized area of discolored intact skin or blood-filled blister due to damage of underlying soft tissue from pressure and/or shear. purple 03/17/21  0200  --  4            Pertinent Labs: CBC Latest Ref Rng & Units 03/20/2021 03/19/2021 03/18/2021  WBC 4.0 - 10.5 K/uL 7.3 7.8 11.9(H)  Hemoglobin 12.0 - 15.0 g/dL 7.0(L) 7.4(L) 7.6(L)  Hematocrit 36.0 - 46.0 % 19.9(L) 21.7(L) 21.8(L)  Platelets 150 - 400 K/uL 238 268 277    CMP Latest Ref Rng & Units 03/20/2021 03/19/2021 03/18/2021  Glucose 70 - 99 mg/dL 115(H) 96 86  BUN 6 - 20 mg/dL <5(L) 5(L) 13  Creatinine 0.44 - 1.00 mg/dL 0.33(L) 0.45 0.64  Sodium 135 - 145 mmol/L 133(L) 134(L) 134(L)  Potassium 3.5 - 5.1 mmol/L 3.4(L) 3.3(L) 3.5  Chloride 98 - 111 mmol/L 100 99 95(L)  CO2 22 - 32 mmol/L 30 26 25   Calcium 8.9 - 10.3 mg/dL 7.4(L) 7.1(L) 6.9(L)  Total Protein 6.5 - 8.1 g/dL - - 3.7(L)  Total Bilirubin 0.3 - 1.2 mg/dL - - 1.7(H)  Alkaline Phos 38 - 126 U/L - - 251(H)  AST 15 - 41 U/L - - 147(H)  ALT 0 - 44 U/L - - 42     Recent Labs    03/20/21 1917 03/20/21 2342 03/21/21 0325  GLUCAP 130* 114* 125*     Pertinent Imaging: No results found.  ASSESSMENT/PLAN:  Assessment: Principal Problem:   Alcohol withdrawal (H[facility]) Active Problems:   Bipolar disorder (HCC)   Asthma   Panic disorder   Hypokalemia   Hypomagnesemia   Hypocalcemia   Macrocytic anemia   Elevated serum hCG   Hyperammonemia ([facility])   Cellulitis   Plan: #Alcohol withdrawal with delirium #Toxic metabolic encephalopathy Patient is now off Precedex but still requiring phenobarbital taper and as needed Ativan. -Continue phenobarbital taper -Continue CIWA protocol with Ativan every 4 hours -Continue thiamine, folate, multivitamin -We will continue to monitor electrolytes  #Hypomagnesemia Repeat plate magnesium today.  #Bipolar disorder Psychiatry has been consulted and are making medication recommendations -Continue Seroquel per psychiatry's recommendations  #Urinary retention:  -Continue Flomax -Continue urinary catheter -We will try voiding trials in the next day or 2  Best Practice: Diet: tube feeds IVF: Fluids: none, Rate: None VTE: heparin injection 5,000 Units Start: 03/17/21 0600 SCDs Start: 03/16/21 2142 Code: Full AB: none Therapy Recs: Pending, DME: none DISPO: Anticipated discharge  TBD  to  TBD  pending Medical stability.  Signature: Lawerance Cruel, D.O.  Internal Medicine Resident, PGY-3 [facility]  Pager: [phone] 6:19 AM, 03/21/2021   Please contact the on call pager after 5 pm and on weekends at [phone].

## 2021-03-22 LAB — CBC
HCT: 24 % — ABNORMAL LOW (ref 36.0–46.0)
Hemoglobin: 8.2 g/dL — ABNORMAL LOW (ref 12.0–15.0)
MCH: 40.8 pg — ABNORMAL HIGH (ref 26.0–34.0)
MCHC: 34.2 g/dL (ref 30.0–36.0)
MCV: 119.4 fL — ABNORMAL HIGH (ref 80.0–100.0)
Platelets: 340 10*3/uL (ref 150–400)
RBC: 2.01 MIL/uL — ABNORMAL LOW (ref 3.87–5.11)
RDW: 18.8 % — ABNORMAL HIGH (ref 11.5–15.5)
WBC: 7.7 10*3/uL (ref 4.0–10.5)
nRBC: 0 % (ref 0.0–0.2)

## 2021-03-22 LAB — GLUCOSE, CAPILLARY
Glucose-Capillary: 121 mg/dL — ABNORMAL HIGH (ref 70–99)
Glucose-Capillary: 121 mg/dL — ABNORMAL HIGH (ref 70–99)
Glucose-Capillary: 122 mg/dL — ABNORMAL HIGH (ref 70–99)
Glucose-Capillary: 126 mg/dL — ABNORMAL HIGH (ref 70–99)
Glucose-Capillary: 137 mg/dL — ABNORMAL HIGH (ref 70–99)

## 2021-03-22 LAB — BASIC METABOLIC PANEL
Anion gap: 7 (ref 5–15)
BUN: <5 mg/dL — ABNORMAL LOW (ref 6–20)
CO2: 28 mmol/L (ref 22–32)
Calcium: 8.1 mg/dL — ABNORMAL LOW (ref 8.9–10.3)
Chloride: 99 mmol/L (ref 98–111)
Creatinine, Ser: 0.39 mg/dL — ABNORMAL LOW (ref 0.44–1.00)
GFR, Estimated: >60 mL/min (ref >60)
Glucose, Bld: 108 mg/dL — ABNORMAL HIGH (ref 70–99)
Potassium: 4.6 mmol/L (ref 3.5–5.1)
Sodium: 134 mmol/L — ABNORMAL LOW (ref 135–145)

## 2021-03-22 LAB — PHOSPHORUS: Phosphorus: 5.4 mg/dL — ABNORMAL HIGH (ref 2.5–4.6)

## 2021-03-22 LAB — MAGNESIUM: Magnesium: 1.8 mg/dL (ref 1.7–2.4)

## 2021-03-22 MED ORDER — CHLORDIAZEPOXIDE HCL 25 MG PO CAPS
25.0000 mg | ORAL_CAPSULE | Freq: Three times a day (TID) | ORAL | Status: DC
Start: 2021-03-22 — End: 2021-03-24
  Administered 2021-03-22 – 2021-03-24 (×6): 25 mg via ORAL
  Filled 2021-03-22 (×6): qty 1

## 2021-03-22 MED ORDER — DICLOFENAC SODIUM 1 % EX GEL
2.0000 g | Freq: Two times a day (BID) | CUTANEOUS | Status: DC | PRN
  Administered 2021-03-24 – 2021-03-25 (×2): 2 g via TOPICAL
  Filled 2021-03-22: qty 100

## 2021-03-22 NOTE — Progress Notes (Addendum)
HD#6 SUBJECTIVE:  Patient Summary: Stacey Wong is a 40 y.o. with a pertinent PMH of bipolar/behavioral health disorder, anemia, hypertension, chronic pain syndrome, alcohol use disorder, who is admitted for complicated alcohol w/d requiring ICU admission.   Overnight Events: no events.   Interim History: Patient found lying in bed. Complains of diffuse pain. Patient answers all orientation questions correctly. She is still experiencing some visual hallucinations. She denies N/V and does not appear tremulous.   OBJECTIVE:  Vital Signs: Vitals:   03/22/21 0130 03/22/21 0205 03/22/21 0400 03/22/21 0724  BP: (!) 122/94 113/81 104/79 (!) 133/97  Pulse: (!) 115 (!) 109 (!) 111 (!) 114  Resp: (!) 21 20 20  (!) 23  Temp:  98.5 F (36.9 C) 97.7 F (36.5 C) 98.5 F (36.9 C)  TempSrc:   Oral Oral  SpO2: 97% 97% 97% 100%  Weight:  57.6 kg    Height:       Supplemental O2: Room Air SpO2: 100 % O2 Flow Rate (L/min): 2 L/min  Filed Weights   03/20/21 0500 03/21/21 0500 03/22/21 0205  Weight: 60.9 kg 57.7 kg 57.6 kg     Intake/Output Summary (Last 24 hours) at 03/22/2021 0935 Last data filed at 03/22/2021 0401 Gross per 24 hour  Intake 1796.33 ml  Output 3860 ml  Net -2063.67 ml    Net IO Since Admission: -1,789.94 mL [03/22/21 0935]  Physical Exam: Physical Exam Vitals reviewed.  HENT:     Nose:     Comments: NG tube in place Cardiovascular:     Rate and Rhythm: Normal rate and regular rhythm.     Heart sounds: No murmur heard. Pulmonary:     Effort: Pulmonary effort is normal.     Breath sounds: Normal breath sounds.  Abdominal:     General: Bowel sounds are normal.     Palpations: Abdomen is soft.     Tenderness: There is no abdominal tenderness.  Neurological:     Mental Status: She is alert.  Psychiatric:     Comments: restless     Patient Lines/Drains/Airways Status     Active Line/Drains/Airways     Name Placement date Placement time Site Days    Peripheral IV 03/20/21 20 G 1" Left Antecubital 03/20/21  0700  Antecubital  1   Peripheral IV 03/20/21 20 G 2.5" Left;Upper Arm 03/20/21  0705  Arm  1   Urethral Catheter Berenice Bouton RN Temperature probe 14 Fr. 03/17/21  2330  Temperature probe  4   Small Bore Feeding Tube 10 Fr. Left nare Marking at nare/corner of mouth 65 cm 03/19/21  1041  Left nare  2   Pressure Injury 03/17/21 Sacrum Mid;Upper Deep Tissue Pressure Injury - Purple or maroon localized area of discolored intact skin or blood-filled blister due to damage of underlying soft tissue from pressure and/or shear. purple 03/17/21  0200  -- 4            Pertinent Labs: CBC Latest Ref Rng & Units 03/22/2021 03/21/2021 03/20/2021  WBC 4.0 - 10.5 K/uL 7.7 7.4 7.3  Hemoglobin 12.0 - 15.0 g/dL 8.2(L) 8.4(L) 7.0(L)  Hematocrit 36.0 - 46.0 % 24.0(L) 24.3(L) 19.9(L)  Platelets 150 - 400 K/uL 340 294 238    CMP Latest Ref Rng & Units 03/22/2021 03/21/2021 03/20/2021  Glucose 70 - 99 mg/dL 108(H) 103(H) 115(H)  BUN 6 - 20 mg/dL <5(L) <5(L) <5(L)  Creatinine 0.44 - 1.00 mg/dL 0.39(L) 0.31(L) 0.33(L)  Sodium 135 - 145  mmol/L 134(L) 131(L) 133(L)  Potassium 3.5 - 5.1 mmol/L 4.6 4.8 3.4(L)  Chloride 98 - 111 mmol/L 99 101 100  CO2 22 - 32 mmol/L 28 26 30   Calcium 8.9 - 10.3 mg/dL 8.1(L) 8.1(L) 7.4(L)  Total Protein 6.5 - 8.1 g/dL - - -  Total Bilirubin 0.3 - 1.2 mg/dL - - -  Alkaline Phos 38 - 126 U/L - - -  AST 15 - 41 U/L - - -  ALT 0 - 44 U/L - - -    Recent Labs    03/21/21 2332 03/22/21 0358 03/22/21 0724  GLUCAP 123* 121* 121*      Pertinent Imaging: No results found.  ASSESSMENT/PLAN:  Assessment: Stacey Wong is a 40 y.o. with a pertinent PMH of bipolar/behavioral health disorder, anemia, hypertension, chronic pain syndrome, alcohol use disorder, who is admitted for complicated alcohol w/d requiring ICU admission.  Plan: #Alcohol withdrawal with delirium #Toxic metabolic encephalopathy CIWAs of 4 and 6  overnight. Patient appears to be improving. Able to converse this morning, but still with VH.  -Phenobarb 30 mg TID today, then D/C -Add Librium 25 mg TID -Continue CIWA protocol with Ativan every 4 hours -Continue thiamine, folate, multivitamin -We will continue to monitor electrolytes -TOC consult for potential rehab  #Bipolar disorder Psychiatry has been consulted and are making medication recommendations -Continue Seroquel per psychiatry's recommendations  #Urinary retention:  -Continue Flomax -Continue urinary catheter -Consider voiding trial as mental status improves  Best Practice: Diet: tube feeds, CLD IVF: Fluids: none, Rate: None VTE: heparin injection 5,000 Units Start: 03/17/21 0600 SCDs Start: 03/16/21 2142 Code: Full AB: none Therapy Recs: Pending, DME: none DISPO: Anticipated discharge  TBD  to  TBD  pending Medical stability.  Signature: Corky Sox, MD PGY-1 Pager: [phone]  Please contact the on call pager after 5 pm and on weekends at [phone].

## 2021-03-22 NOTE — Consult Note (Signed)
[facility] Face-to-Face Psychiatry Consult   Reason for Consult:  Alcohol withdrawal agitation, Hx of Bipolar disorder, currently untreated Referring Physician:  Marshell Garfinkel, MD Patient Identification: Stacey Wong MRN:  [id number] Principal Diagnosis: Alcohol withdrawal ([place]) Diagnosis:  Principal Problem:   Alcohol withdrawal ([place]) Active Problems:   Bipolar disorder ([place])   Asthma   Panic disorder   Hypokalemia   Hypomagnesemia   Hypocalcemia   Macrocytic anemia   Elevated serum hCG   Hyperammonemia ([place])   Cellulitis  Assessment  Stacey Wong is a 40 y.o. 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   She meets criteria for EtOH use disorder based on presentation and hx per EMR and family collateral.  Patient's assessment was hindered by her present delirium; however she does endorse a possible recent manic episode and her husband was able to corroborate behaviors concerning for Bipolar disorder. Outpatient psychotropic medications included lamictal in the past and historically he has had a good response to these medications. She was not compliant with medications prior to admission as evidenced by patient and patinet's husband endorsing that patient has been unmedicated for years . On initial examination, patient appears delirious but attempts to do her best to participate on assessment. We plan to recommend psychotropic treatment regimens for likely Bipolar disorder and will also assist with psychotic symptoms during [facility] withdrawal .    Patient was transferred out the ICU. Patient appears to be hallucinating more today but is less irritable and sleeping well through the night and not requiring PRNs for agitation.  Patient is more easily redirectable. Patient reports being less irritable and was noted to be less argumentative (even with her hallucinations of her family). Patient appears to be responding well to Seroquel, but her hallucinations continues although they appear to be more related to her withdrawal, as she is reporting tactile and visible hallucinations. Tactile hallucinations are more coming with substance use. Will hold patient's Seroquel at current dose in an attempt to decrease sedated appearance of patient.    EtoH withdrawal, complicated EtOH use disorder - Detox per primary - Recommend SW consult for rehab or detox options   Hx Bipolar disorder - Continue Seroquel 50mg  daily 200mg  QHS - Patient precedex discontinued 11/23 - Recommend SW consult for OP psychiatry options (likely [facility])   Tobacco use disorder - Continue Nicotine patch   Safety - At this time patient appears to be of low risk of danger to herself or others. Recommend routine observation.   Dispo - Per primary    Total Time spent with patient: 15 minutes  Subjective:   Stacey Wong is a 40 y.o. female patient admitted with EtoH withdrawal.  HPI:  ON patient was transferred to the floor. Patient had no ON behavior events and has not required PRN medication for agitation in 24 hrs. Patient RN this AM reported that patient appears to be having VH of her family being in the room and believes the RN is her mother. RN reported that patient also reported feeling bugs crawling on her skin.  RN also notes that patient's confusion contributes to patient attempting to pull at her lines.   Patient was awake this AM, although appeared drowsy and kept her eyes closed most of the assessment.  Patient was AOx4 and recalled the provider from previous assessments. Patient reported that she was feeling ok today but reported that she could feel her sister trapping her hand in the bed. Patient reported that she feels  less irritable overall, slept well last night and denied SI, HI, and AVH.   Past Psychiatric History: Bipolar disorder, tx with lamictal    Past Medical History:  Past Medical History:  Diagnosis Date   Asthma    Bipolar 1 disorder ([place])    Hypertension     Past Surgical History:  Procedure Laterality Date   CHOLECYSTECTOMY     Family History: History reviewed. No pertinent family history.  Social History:  Social History   Substance and Sexual Activity  Alcohol Use Yes   Alcohol/week: 0.0 standard drinks   Comment: occasionally     Social History   Substance and Sexual Activity  Drug Use No    Social History   Socioeconomic History   Marital status: Divorced    Spouse name: Not on file   Number of children: Not on file   Years of education: Not on file   Highest education level: Not on file  Occupational History   Not on file  Tobacco Use   Smoking status: Every Day    Packs/day: 1.00    Types: Cigarettes   Smokeless tobacco: Never   Tobacco comments:    decreased smoking  Substance and Sexual Activity   Alcohol use: Yes    Alcohol/week: 0.0 standard drinks    Comment: occasionally   Drug use: No   Sexual activity: Not on file  Other Topics Concern   Not on file  Social History Narrative   Not on file   Social Determinants of Health   Financial Resource Strain: Not on file  Food Insecurity: Not on file  Transportation Needs: Not on file  Physical Activity: Not on file  Stress: Not on file  Social Connections: Not on file   Additional Social History:    Allergies:  No Known Allergies  Labs:  Results for orders placed or performed during the hospital encounter of 03/16/21 (from the past 48 hour(s))  Glucose, capillary     Status: Abnormal   Collection Time: 03/20/21 11:01 AM  Result Value Ref Range   Glucose-Capillary 125 (H) 70 - 99 mg/dL    Comment: Glucose reference range applies only to samples taken after fasting for at least 8  hours.  Glucose, capillary     Status: Abnormal   Collection Time: 03/20/21  3:36 PM  Result Value Ref Range   Glucose-Capillary 135 (H) 70 - 99 mg/dL    Comment: Glucose reference range applies only to samples taken after fasting for at least 8 hours.  Magnesium     Status: Abnormal   Collection Time: 03/20/21  4:52 PM  Result Value Ref Range   Magnesium 1.4 (L) 1.7 - 2.4 mg/dL    Comment: Performed at [facility] [address] [place], [place] [address]  Phosphorus     Status: Abnormal   Collection Time: 03/20/21  4:52 PM  Result Value Ref Range   Phosphorus 1.1 (L) 2.5 - 4.6 mg/dL    Comment: Performed at [facility] [address], [place], [place] [address]  Glucose, capillary     Status: Abnormal   Collection Time: 03/20/21  7:17 PM  Result Value Ref Range   Glucose-Capillary 130 (H) 70 - 99 mg/dL    Comment:  Glucose reference range applies only to samples taken after fasting for at least 8 hours.  Glucose, capillary     Status: Abnormal   Collection Time: 03/20/21 11:42 PM  Result Value Ref Range   Glucose-Capillary 114 (H) 70 - 99 mg/dL    Comment: Glucose reference range applies only to samples taken after fasting for at least 8 hours.  Glucose, capillary     Status: Abnormal   Collection Time: 03/21/21  3:25 AM  Result Value Ref Range   Glucose-Capillary 125 (H) 70 - 99 mg/dL    Comment: Glucose reference range applies only to samples taken after fasting for at least 8 hours.  Basic metabolic panel     Status: Abnormal   Collection Time: 03/21/21  6:19 AM  Result Value Ref Range   Sodium 131 (L) 135 - 145 mmol/L   Potassium 4.8 3.5 - 5.1 mmol/L   Chloride 101 98 - 111 mmol/L   CO2 26 22 - 32 mmol/L   Glucose, Bld 103 (H) 70 - 99 mg/dL    Comment: Glucose reference range applies only to samples taken after fasting for at least 8 hours.   BUN <5 (L) 6 - 20 mg/dL   Creatinine, Ser 0.31 (L) 0.44 - 1.00 mg/dL   Calcium 8.1 (L) 8.9 - 10.3 mg/dL   GFR,  Estimated >60 >60 mL/min    Comment: (NOTE) Calculated using the CKD-EPI Creatinine Equation (2021)    Anion gap 4 (L) 5 - 15    Comment: Performed at [facility] [address] [place], [place] [address]  CBC     Status: Abnormal   Collection Time: 03/21/21  6:19 AM  Result Value Ref Range   WBC 7.4 4.0 - 10.5 K/uL   RBC 2.02 (L) 3.87 - 5.11 MIL/uL   Hemoglobin 8.4 (L) 12.0 - 15.0 g/dL   HCT 24.3 (L) 36.0 - 46.0 %   MCV 120.3 (H) 80.0 - 100.0 fL   MCH 41.6 (H) 26.0 - 34.0 pg   MCHC 34.6 30.0 - 36.0 g/dL   RDW 18.8 (H) 11.5 - 15.5 %   Platelets 294 150 - 400 K/uL   nRBC 0.0 0.0 - 0.2 %    Comment: Performed at [facility] [address] [place], [place] [address]  Magnesium     Status: Abnormal   Collection Time: 03/21/21  6:19 AM  Result Value Ref Range   Magnesium 1.3 (L) 1.7 - 2.4 mg/dL    Comment: Performed at [facility] [address] [place], [place] [address]  Phosphorus     Status: Abnormal   Collection Time: 03/21/21  6:19 AM  Result Value Ref Range   Phosphorus 1.7 (L) 2.5 - 4.6 mg/dL    Comment: Performed at [facility] [address], [place], [place] [address]  Glucose, capillary     Status: Abnormal   Collection Time: 03/21/21  7:53 AM  Result Value Ref Range   Glucose-Capillary 136 (H) 70 - 99 mg/dL    Comment: Glucose reference range applies only to samples taken after fasting for at least 8 hours.  Glucose, capillary     Status: None   Collection Time: 03/21/21 11:32 AM  Result Value Ref Range   Glucose-Capillary 94 70 - 99 mg/dL    Comment: Glucose reference range applies only to samples taken after fasting for at least 8 hours.  Glucose, capillary     Status: Abnormal  Collection Time: 03/21/21  3:21 PM  Result Value Ref Range   Glucose-Capillary 103 (H) 70 - 99 mg/dL    Comment: Glucose reference range applies only to samples taken after fasting for at least 8 hours.  Magnesium     Status: None   Collection Time:  03/21/21  5:40 PM  Result Value Ref Range   Magnesium 2.2 1.7 - 2.4 mg/dL    Comment: Performed at [facility], [place] [address] [place], [place] [address]  Phosphorus     Status: None   Collection Time: 03/21/21  5:40 PM  Result Value Ref Range   Phosphorus 4.3 2.5 - 4.6 mg/dL    Comment: Performed at [facility] [address] [place], [place] [address]  Glucose, capillary     Status: Abnormal   Collection Time: 03/21/21  7:24 PM  Result Value Ref Range   Glucose-Capillary 123 (H) 70 - 99 mg/dL    Comment: Glucose reference range applies only to samples taken after fasting for at least 8 hours.  Glucose, capillary     Status: Abnormal   Collection Time: 03/21/21 11:32 PM  Result Value Ref Range   Glucose-Capillary 123 (H) 70 - 99 mg/dL    Comment: Glucose reference range applies only to samples taken after fasting for at least 8 hours.  Glucose, capillary     Status: Abnormal   Collection Time: 03/22/21  3:58 AM  Result Value Ref Range   Glucose-Capillary 121 (H) 70 - 99 mg/dL    Comment: Glucose reference range applies only to samples taken after fasting for at least 8 hours.   Comment 1 Notify RN    Comment 2 Document in Chart   Magnesium     Status: None   Collection Time: 03/22/21  4:34 AM  Result Value Ref Range   Magnesium 1.8 1.7 - 2.4 mg/dL    Comment: Performed at [facility], [place] [address], [place], [place] [address]  Phosphorus     Status: Abnormal   Collection Time: 03/22/21  4:34 AM  Result Value Ref Range   Phosphorus 5.4 (H) 2.5 - 4.6 mg/dL    Comment: Performed at [facility] [address] [place], [place] [address]  CBC     Status: Abnormal   Collection Time: 03/22/21  4:34 AM  Result Value Ref Range   WBC 7.7 4.0 - 10.5 K/uL   RBC 2.01 (L) 3.87 - 5.11 MIL/uL   Hemoglobin 8.2 (L) 12.0 - 15.0 g/dL   HCT 24.0 (L) 36.0 - 46.0 %   MCV 119.4 (H) 80.0 - 100.0 fL   MCH 40.8 (H) 26.0 - 34.0 pg   MCHC 34.2 30.0 - 36.0 g/dL   RDW  18.8 (H) 11.5 - 15.5 %   Platelets 340 150 - 400 K/uL   nRBC 0.0 0.0 - 0.2 %    Comment: Performed at [facility], [place] [address], [place],  [address]  Basic metabolic panel     Status: Abnormal   Collection Time: 03/22/21  4:34 AM  Result Value Ref Range   Sodium 134 (L) 135 - 145 mmol/L   Potassium 4.6 3.5 - 5.1 mmol/L   Chloride 99 98 - 111 mmol/L   CO2 28 22 - 32 mmol/L   Glucose, Bld 108 (H) 70 - 99 mg/dL    Comment: Glucose reference range applies only to samples taken after fasting for at least 8 hours.  BUN <5 (L) 6 - 20 mg/dL   Creatinine, Ser 0.39 (L) 0.44 - 1.00 mg/dL   Calcium 8.1 (L) 8.9 - 10.3 mg/dL   GFR, Estimated >60 >60 mL/min    Comment: (NOTE) Calculated using the CKD-EPI Creatinine Equation (2021)    Anion gap 7 5 - 15    Comment: Performed at [facility] [address] [place], [place] [address]  Glucose, capillary     Status: Abnormal   Collection Time: 03/22/21  7:24 AM  Result Value Ref Range   Glucose-Capillary 121 (H) 70 - 99 mg/dL    Comment: Glucose reference range applies only to samples taken after fasting for at least 8 hours.    Current Facility-Administered Medications  Medication Dose Route Frequency Provider Last Rate Last Admin   chlordiazePOXIDE (LIBRIUM) capsule 25 mg  25 mg Oral TID Corky Sox, MD       chlorhexidine (PERIDEX) 0.12 % solution 15 mL  15 mL Mouth Rinse BID Aslam, Loralyn Freshwater, MD   15 mL at 03/21/21 2212   Chlorhexidine Gluconate Cloth 2 % PADS 6 each  6 each Topical Q0600 Renee Pain, MD   6 each at 03/21/21 2200   dexmedetomidine (PRECEDEX) 400 MCG/100ML (4 mcg/mL) infusion  0.2-0.7 mcg/kg/hr (Order-Specific) Intravenous Continuous Heloise Purpura, [place] at 03/20/21 0547   docusate sodium (COLACE) capsule 100 mg  100 mg Oral BID Marianna Payment, MD       feeding supplement (JEVITY 1.2 CAL) liquid 1,000 mL  1,000 mL Per Tube Continuous Mannam, Praveen, MD 70 mL/hr at 03/21/21 1453 1,000 mL  at 03/88/82 8003   folic acid (FOLVITE) tablet 1 mg  1 mg Oral Daily Gaylan Gerold, DO   1 mg at 03/21/21 0910   heparin injection 5,000 Units  5,000 Units Subcutaneous Q8H Nevada Crane M, PA-C   5,000 Units at 03/22/21 0408   insulin aspart (novoLOG) injection 0-9 Units  0-9 Units Subcutaneous Q4H PRN Aslam, Loralyn Freshwater, MD       lip balm (CARMEX) ointment   Topical PRN Spero Geralds, MD       LORazepam (ATIVAN) injection 1-4 mg  1-4 mg Intravenous Q1H PRN [place] Crane M, PA-C   2 mg at 03/21/21 1502   Or   LORazepam (ATIVAN) tablet 1-4 mg  1-4 mg Oral Q1H PRN Lestine Mount, PA-C       MEDLINE mouth rinse  15 mL Mouth Rinse q12n4p Aslam, Sadia, MD   15 mL at 03/21/21 1600   multivitamin with minerals tablet 1 tablet  1 tablet Oral Daily Gaylan Gerold, DO   1 tablet at 03/21/21 0910   nicotine (NICODERM CQ - dosed in mg/24 hours) patch 21 mg  21 mg Transdermal Daily Corky Sox, MD       PHENObarbital (LUMINAL) injection 30 mg  30 mg Intravenous TID Spero Geralds, MD   30 mg at 03/21/21 2159   polyethylene glycol (MIRALAX / GLYCOLAX) packet 17 g  17 g Oral Daily Marianna Payment, MD   17 g at 03/21/21 1128   QUEtiapine (SEROQUEL) tablet 200 mg  200 mg Per Tube QHS Mannam, Praveen, MD   200 mg at 03/21/21 2204   QUEtiapine (SEROQUEL) tablet 50 mg  50 mg Oral Daily Corky Sox, MD   50 mg at 03/21/21 1445   tamsulosin (FLOMAX) capsule 0.4 mg  0.4 mg Oral Daily Harvie Heck, MD   0.4 mg at 03/21/21 0910   thiamine (  B-1) 400 mg in sodium chloride 0.9 % 50 mL IVPB  400 mg Intravenous TID Cinderella, Margaret A   Stopped at 03/21/21 2241   Followed by   Derrill Memo ON 03/25/2021] thiamine tablet 100 mg  100 mg Oral Daily Cinderella, Margaret A         Psychiatric Specialty Exam:  Presentation  General Appearance: Bizarre  Eye Contact:Minimal  Speech:Slow; Slurred  Speech Volume:Decreased  Handedness:No data recorded  Mood and Affect   Mood:Euthymic  Affect:Constricted   Thought Process  Thought Processes:Goal Directed  Descriptions of Associations:Circumstantial  Orientation:Full (Time, Place and Person)  Thought Content:Logical  History of Schizophrenia/Schizoaffective disorder:No data recorded Duration of Psychotic Symptoms:No data recorded Hallucinations:Hallucinations: Tactile; Visual  Ideas of Reference:None  Suicidal Thoughts:Suicidal Thoughts: No  Homicidal Thoughts:Homicidal Thoughts: No   Sensorium  Memory:Immediate Fair  Judgment:Impaired  Insight:Shallow   Executive Functions  Concentration:Poor  Attention Span:Poor  Recall:No data recorded Fund of Knowledge:Fair  Language:Poor   Psychomotor Activity  Psychomotor Activity:Psychomotor Activity: Restlessness   Assets  Assets:Social Support; Intimacy; Resilience   Sleep  Sleep:Sleep: Good   Physical Exam: Physical Exam Constitutional:      Comments: Drowsy, but not falling asleep during conversation  HENT:     Head: Normocephalic and atraumatic.  Pulmonary:     Effort: Pulmonary effort is normal.  Neurological:     Mental Status: She is oriented to person, place, and time.   Review of Systems  Psychiatric/Behavioral:  Positive for hallucinations. Negative for depression and suicidal ideas. The patient does not have insomnia.   Blood pressure (!) 133/97, pulse (!) 114, temperature 98.5 F (36.9 C), temperature source Oral, resp. rate (!) 23, height 5\' 4"  (1.626 m), weight 57.6 kg, SpO2 100 %. Body mass index is 21.8 kg/m.   PGY-2 Freida Busman, MD 03/22/2021 9:46 AM

## 2021-03-22 NOTE — Hospital Course (Addendum)
11/28 Husband would like to speak to doc Feeling pretty good Eating well  HR in 120s Feels like a science experiment... Patient's dad is sick in hospital , has a lot going on  Throwing up every time she would eat - abd pain? Never had this happen before    Discontinued CBGs and SSI

## 2021-03-22 NOTE — Plan of Care (Signed)
Restraints removed this afternoon.

## 2021-03-22 NOTE — Progress Notes (Signed)
Received pt from 67M. Pt resting quietly in bed with no distress noted. Easily arouses to name and touch. Bilateral soft wrist restraints in place and secure with skin and circulation intact. Foley draining clear yellow urine to gravity and secured with stat lock. V/S obtained, tele monitor applied and verified with CCMD. Core track intact to left nare with Jevity 1.2 infusing at 45ml/hr. Large scab noted to right side of face close to ear as well as bruising and scattered small abrasions to hands, abd, arms and legs. Lungs clear. Reporting RN stated family not yet aware of move, this nurse will inform them closer to 0600-0700.

## 2021-03-23 LAB — BASIC METABOLIC PANEL
Anion gap: 7 (ref 5–15)
Anion gap: 9 (ref 5–15)
BUN: 5 mg/dL — ABNORMAL LOW (ref 6–20)
BUN: 8 mg/dL (ref 6–20)
CO2: 26 mmol/L (ref 22–32)
CO2: 24 mmol/L (ref 22–32)
Calcium: 8.3 mg/dL — ABNORMAL LOW (ref 8.9–10.3)
Calcium: 8.3 mg/dL — ABNORMAL LOW (ref 8.9–10.3)
Chloride: 95 mmol/L — ABNORMAL LOW (ref 98–111)
Chloride: 91 mmol/L — ABNORMAL LOW (ref 98–111)
Creatinine, Ser: 0.35 mg/dL — ABNORMAL LOW (ref 0.44–1.00)
Creatinine, Ser: 0.35 mg/dL — ABNORMAL LOW (ref 0.44–1.00)
GFR, Estimated: >60 mL/min (ref >60)
GFR, Estimated: >60 mL/min (ref >60)
Glucose, Bld: 124 mg/dL — ABNORMAL HIGH (ref 70–99)
Glucose, Bld: 114 mg/dL — ABNORMAL HIGH (ref 70–99)
Potassium: 4.9 mmol/L (ref 3.5–5.1)
Potassium: 5.3 mmol/L — ABNORMAL HIGH (ref 3.5–5.1)
Sodium: 128 mmol/L — ABNORMAL LOW (ref 135–145)
Sodium: 124 mmol/L — ABNORMAL LOW (ref 135–145)

## 2021-03-23 LAB — CBC
HCT: 25.6 % — ABNORMAL LOW (ref 36.0–46.0)
Hemoglobin: 9.1 g/dL — ABNORMAL LOW (ref 12.0–15.0)
MCH: 41.6 pg — ABNORMAL HIGH (ref 26.0–34.0)
MCHC: 35.5 g/dL (ref 30.0–36.0)
MCV: 116.9 fL — ABNORMAL HIGH (ref 80.0–100.0)
Platelets: 412 10*3/uL — ABNORMAL HIGH (ref 150–400)
RBC: 2.19 MIL/uL — ABNORMAL LOW (ref 3.87–5.11)
RDW: 18.6 % — ABNORMAL HIGH (ref 11.5–15.5)
WBC: 8.5 10*3/uL (ref 4.0–10.5)
nRBC: 0.4 % — ABNORMAL HIGH (ref 0.0–0.2)

## 2021-03-23 LAB — GLUCOSE, CAPILLARY
Glucose-Capillary: 122 mg/dL — ABNORMAL HIGH (ref 70–99)
Glucose-Capillary: 132 mg/dL — ABNORMAL HIGH (ref 70–99)
Glucose-Capillary: 133 mg/dL — ABNORMAL HIGH (ref 70–99)
Glucose-Capillary: 134 mg/dL — ABNORMAL HIGH (ref 70–99)
Glucose-Capillary: 142 mg/dL — ABNORMAL HIGH (ref 70–99)
Glucose-Capillary: 166 mg/dL — ABNORMAL HIGH (ref 70–99)

## 2021-03-23 LAB — MAGNESIUM: Magnesium: 1.5 mg/dL — ABNORMAL LOW (ref 1.7–2.4)

## 2021-03-23 LAB — PHOSPHORUS: Phosphorus: 6.6 mg/dL — ABNORMAL HIGH (ref 2.5–4.6)

## 2021-03-23 MED ORDER — ACETAMINOPHEN 500 MG PO TABS
500.0000 mg | ORAL_TABLET | Freq: Four times a day (QID) | ORAL | Status: DC | PRN
  Administered 2021-03-24 – 2021-03-26 (×7): 500 mg via ORAL
  Filled 2021-03-23 (×8): qty 1

## 2021-03-23 MED ORDER — MAGNESIUM SULFATE 4 GM/100ML IV SOLN
4.0000 g | Freq: Once | INTRAVENOUS | Status: AC
  Administered 2021-03-23: 4 g via INTRAVENOUS
  Filled 2021-03-23: qty 100

## 2021-03-23 NOTE — Progress Notes (Signed)
HD#7 SUBJECTIVE:  Patient Summary: Stacey Wong is a 40 y.o. with a pertinent PMH of bipolar/behavioral health disorder, anemia, hypertension, chronic pain syndrome, alcohol use disorder, who is admitted for complicated alcohol w/d requiring ICU admission.   Overnight Events: CIWA of 10, patient given Ativan x2 last night   Interim History: Patient found lying in bed. She appears lethargic and minimally engages in interview. She is oriented to place but guesses January for the current month. Per nursing, the patient has been taking in minimal PO.   OBJECTIVE:  Vital Signs: Vitals:   03/23/21 0338 03/23/21 0339 03/23/21 0400 03/23/21 0420  BP:   93/62 123/84  Pulse: (!) 120 (!) 120  (!) 110  Resp: 16 17    Temp:      TempSrc:      SpO2: 100% 98%  97%  Weight:      Height:       Supplemental O2: Room Air SpO2: 97 % O2 Flow Rate (L/min): 2 L/min  Filed Weights   03/21/21 0500 03/22/21 0205 03/23/21 0138  Weight: 57.7 kg 57.6 kg 55.6 kg     Intake/Output Summary (Last 24 hours) at 03/23/2021 1056 Last data filed at 03/23/2021 0700 Gross per 24 hour  Intake 1730 ml  Output 3500 ml  Net -1770 ml    Net IO Since Admission: -4,509.94 mL [03/23/21 1056]  Physical Exam: Physical Exam Vitals reviewed.  HENT:     Nose:     Comments: NG tube in place Cardiovascular:     Rate and Rhythm: Normal rate and regular rhythm.     Heart sounds: No murmur heard. Pulmonary:     Effort: Pulmonary effort is normal.     Breath sounds: Normal breath sounds.  Abdominal:     General: Bowel sounds are normal.     Palpations: Abdomen is soft.     Tenderness: There is no abdominal tenderness.  Neurological:     Comments: No tremor     Patient Lines/Drains/Airways Status     Active Line/Drains/Airways     Name Placement date Placement time Site Days   Peripheral IV 03/20/21 20 G 1" Left Antecubital 03/20/21  0700  Antecubital  1   Peripheral IV 03/20/21 20 G 2.5" Left;Upper Arm  03/20/21  0705  Arm  1   Urethral Catheter Berenice Bouton RN Temperature probe 14 Fr. 03/17/21  2330  Temperature probe  4   Small Bore Feeding Tube 10 Fr. Left nare Marking at nare/corner of mouth 65 cm 03/19/21  1041  Left nare  2   Pressure Injury 03/17/21 Sacrum Mid;Upper Deep Tissue Pressure Injury - Purple or maroon localized area of discolored intact skin or blood-filled blister due to damage of underlying soft tissue from pressure and/or shear. purple 03/17/21  0200  -- 4            Pertinent Labs: CBC Latest Ref Rng & Units 03/23/2021 03/22/2021 03/21/2021  WBC 4.0 - 10.5 K/uL 8.5 7.7 7.4  Hemoglobin 12.0 - 15.0 g/dL 9.1(L) 8.2(L) 8.4(L)  Hematocrit 36.0 - 46.0 % 25.6(L) 24.0(L) 24.3(L)  Platelets 150 - 400 K/uL 412(H) 340 294    CMP Latest Ref Rng & Units 03/23/2021 03/22/2021 03/21/2021  Glucose 70 - 99 mg/dL 124(H) 108(H) 103(H)  BUN 6 - 20 mg/dL 5(L) <5(L) <5(L)  Creatinine 0.44 - 1.00 mg/dL 0.35(L) 0.39(L) 0.31(L)  Sodium 135 - 145 mmol/L 128(L) 134(L) 131(L)  Potassium 3.5 - 5.1 mmol/L 4.9 4.6 4.8  Chloride 98 - 111 mmol/L 95(L) 99 101  CO2 22 - 32 mmol/L 26 28 26   Calcium 8.9 - 10.3 mg/dL 8.3(L) 8.1(L) 8.1(L)  Total Protein 6.5 - 8.1 g/dL - - -  Total Bilirubin 0.3 - 1.2 mg/dL - - -  Alkaline Phos 38 - 126 U/L - - -  AST 15 - 41 U/L - - -  ALT 0 - 44 U/L - - -    Recent Labs    03/23/21 0033 03/23/21 0410 03/23/21 0751  GLUCAP 133* 132* 122*      Pertinent Imaging: No results found.  ASSESSMENT/PLAN:  Assessment: Stacey Wong is a 40 y.o. with a pertinent PMH of bipolar/behavioral health disorder, anemia, hypertension, chronic pain syndrome, alcohol use disorder, who is admitted for complicated alcohol w/d requiring ICU admission.  Plan: #Alcohol withdrawal with delirium #Toxic metabolic encephalopathy CIWA of 10 overnight, given Ativan 2 mg x2. Patient finished phenobarb taper yesterday with transition to Librium 25 mg TID today.  -Librium 25 mg  TID -Continue CIWA protocol with Ativan every 4 hours -Continue thiamine, folate, multivitamin -We will continue to monitor and replace electrolytes -TOC consult for potential rehab  #Hyponatremia Na from 134 to 979 this AM. Uncertain etiology, but patient may have some SIADH.  -Daily BMP  #Bipolar disorder Psychiatry has been consulted and are making medication recommendations -Continue Seroquel per psychiatry's recommendations  #Urinary retention:  -Continue Flomax -Continue urinary catheter -Consider voiding trial as mental status improves  Best Practice: Diet: tube feeds, CLD IVF: Fluids: none, Rate: None VTE: heparin injection 5,000 Units Start: 03/17/21 0600 SCDs Start: 03/16/21 2142 Code: Full Therapy Recs: Pending, DME: none DISPO: Anticipated discharge  TBD  to  TBD  pending Medical stability.  Signature: Corky Sox, MD PGY-1 Pager: [phone]  Please contact the on call pager after 5 pm and on weekends at [phone].

## 2021-03-23 NOTE — Social Work (Signed)
CSW attempted to see pt for substance counseling, pt was asleep, CSW attempted to wake pt but pt seemed confused and fell back to sleep as CSW attempted to speak with pt.CSW will continue to follow and speak with pt when awake and completely oriented.

## 2021-03-24 LAB — CBC
HCT: 27.2 % — ABNORMAL LOW (ref 36.0–46.0)
Hemoglobin: 9.4 g/dL — ABNORMAL LOW (ref 12.0–15.0)
MCH: 40.7 pg — ABNORMAL HIGH (ref 26.0–34.0)
MCHC: 34.6 g/dL (ref 30.0–36.0)
MCV: 117.7 fL — ABNORMAL HIGH (ref 80.0–100.0)
Platelets: 527 10*3/uL — ABNORMAL HIGH (ref 150–400)
RBC: 2.31 MIL/uL — ABNORMAL LOW (ref 3.87–5.11)
RDW: 18.2 % — ABNORMAL HIGH (ref 11.5–15.5)
WBC: 10.3 10*3/uL (ref 4.0–10.5)
nRBC: 0 % (ref 0.0–0.2)

## 2021-03-24 LAB — GLUCOSE, CAPILLARY
Glucose-Capillary: 101 mg/dL — ABNORMAL HIGH (ref 70–99)
Glucose-Capillary: 102 mg/dL — ABNORMAL HIGH (ref 70–99)
Glucose-Capillary: 134 mg/dL — ABNORMAL HIGH (ref 70–99)
Glucose-Capillary: 134 mg/dL — ABNORMAL HIGH (ref 70–99)
Glucose-Capillary: 167 mg/dL — ABNORMAL HIGH (ref 70–99)

## 2021-03-24 LAB — SODIUM, URINE, RANDOM
Sodium, Ur: 65 mmol/L
Sodium, Ur: 30 mmol/L

## 2021-03-24 LAB — OSMOLALITY: Osmolality: 264 mOsm/kg — ABNORMAL LOW (ref 275–295)

## 2021-03-24 LAB — OSMOLALITY, URINE
Osmolality, Ur: 445 mOsm/kg (ref 300–900)
Osmolality, Ur: 159 mOsm/kg — ABNORMAL LOW (ref 300–900)

## 2021-03-24 LAB — BASIC METABOLIC PANEL
Anion gap: 7 (ref 5–15)
BUN: 7 mg/dL (ref 6–20)
CO2: 24 mmol/L (ref 22–32)
Calcium: 8.6 mg/dL — ABNORMAL LOW (ref 8.9–10.3)
Chloride: 95 mmol/L — ABNORMAL LOW (ref 98–111)
Creatinine, Ser: 0.37 mg/dL — ABNORMAL LOW (ref 0.44–1.00)
GFR, Estimated: >60 mL/min (ref >60)
Glucose, Bld: 109 mg/dL — ABNORMAL HIGH (ref 70–99)
Potassium: 5 mmol/L (ref 3.5–5.1)
Sodium: 126 mmol/L — ABNORMAL LOW (ref 135–145)

## 2021-03-24 MED ORDER — SODIUM CHLORIDE 0.9 % IV BOLUS
1000.0000 mL | Freq: Once | INTRAVENOUS | Status: AC
  Administered 2021-03-24: 1000 mL via INTRAVENOUS

## 2021-03-24 MED ORDER — THIAMINE HCL 100 MG/ML IJ SOLN
400.0000 mg | Freq: Three times a day (TID) | INTRAVENOUS | Status: DC
  Administered 2021-03-24 – 2021-03-26 (×6): 400 mg via INTRAVENOUS
  Filled 2021-03-24 (×8): qty 4

## 2021-03-24 MED ORDER — RIVAROXABAN 10 MG PO TABS
10.0000 mg | ORAL_TABLET | Freq: Every day | ORAL | Status: DC
  Administered 2021-03-24 – 2021-03-26 (×3): 10 mg via ORAL
  Filled 2021-03-24 (×3): qty 1

## 2021-03-24 MED ORDER — THIAMINE HCL 100 MG/ML IJ SOLN
100.0000 mg | Freq: Every day | INTRAMUSCULAR | Status: DC
Start: 2021-03-28 — End: 2021-03-26

## 2021-03-24 MED ORDER — LACTATED RINGERS IV BOLUS: 1000.0000 mL | Freq: Once | INTRAVENOUS | Status: DC

## 2021-03-24 MED ORDER — CHLORDIAZEPOXIDE HCL 25 MG PO CAPS
25.0000 mg | ORAL_CAPSULE | Freq: Two times a day (BID) | ORAL | Status: DC
  Administered 2021-03-24 – 2021-03-25 (×2): 25 mg via ORAL
  Filled 2021-03-24 (×2): qty 1

## 2021-03-24 NOTE — Evaluation (Signed)
Physical Therapy Evaluation Patient Details Name: Stacey Wong MRN: [id number] DOB: 1981-03-02 Today's Date: 03/24/2021  History of Present Illness  Pt is a 40 y.o. female who presented 03/16/21 with acute encephalopathy. Pt had stopped drinking x2 days PTA. Pt admitted for complicated alcohol w/d requiring ICU admission. S/p cortrak placement 11/21. CT of head negative for acute intracranial process. PMH of alcohol use disorder, bipolar disorder, hip dysplasia, anemia, HTN, chronic pain syndrome   Clinical Impression  Pt presents with condition above and deficits mentioned below, see PT Problem List. PTA, she was independent without using an AD, living with her husband and father in a 1-level house with a ramped entrance. Currently, pt displays deficits in memory, deficits and safety awareness, processing, problem-solving, and sequencing along with deficits in lower extremity strength, coordination, balance, and activity tolerance. Pt is at high risk for falls, requiring maxA to try to take a few steps this date with a RW. Due to pt's young age, motivation to return to being independent, ability to tolerate therapy, complicated hospital course, and potential to improve quickly recommending pt receive intensive therapy in the inpatient rehab setting to maximize her return to baseline prior to returning home. Will continue to follow acutely.       Recommendations for follow up therapy are one component of a multi-disciplinary discharge planning process, led by the attending physician.  Recommendations may be updated based on patient status, additional functional criteria and insurance authorization.  Follow Up Recommendations Acute inpatient rehab (3hours/day)    Assistance Recommended at Discharge Frequent or constant Supervision/Assistance  Functional Status Assessment Patient has had a recent decline in their functional status and demonstrates the ability to make significant improvements in  function in a reasonable and predictable amount of time.  Equipment Recommendations  Rolling walker (2 wheels);BSC/3in1;Wheelchair (measurements PT);Wheelchair cushion (measurements PT) (pending progression)    Recommendations for Other Services Rehab consult     Precautions / Restrictions Precautions Precautions: Fall Restrictions Weight Bearing Restrictions: No      Mobility  Bed Mobility Overal bed mobility: Needs Assistance Bed Mobility: Supine to Sit;Sit to Supine     Supine to sit: Supervision;HOB elevated Sit to supine: Supervision;HOB elevated   General bed mobility comments: Supervision for safety.    Transfers Overall transfer level: Needs assistance Equipment used: Rolling walker (2 wheels);1 person hand held assist Transfers: Sit to/from Stand Sit to Stand: Min assist;Mod assist           General transfer comment: Cues provided for hand placement, pt requiring minA to transfer to stand 2x from EOB to RW and modA to stand 1x from EOB to PT.    Ambulation/Gait Ambulation/Gait assistance: Max assist Gait Distance (Feet): 3 Feet Assistive device: Rolling walker (2 wheels) Gait Pattern/deviations: Step-through pattern;Decreased stride length;Knees buckling;Trunk flexed;Ataxic Gait velocity: reduced Gait velocity interpretation: <1.31 ft/sec, indicative of household ambulator   General Gait Details: Pt with almost ataxic like gait with bil knees buckling, despite verbal cues to extend during stance. Pt requiring maxA to prevent LOB and catch pt as she would impulsively try to sit prematurely also.  Stairs            Wheelchair Mobility    Modified Rankin (Stroke Patients Only) Modified Rankin (Stroke Patients Only) Pre-Morbid Rankin Score: No symptoms Modified Rankin: Severe disability     Balance Overall balance assessment: Needs assistance Sitting-balance support: No upper extremity supported;Feet supported Sitting balance-Leahy Scale:  Fair Sitting balance - Comments: Static sitting EOB with  supervision for safety.   Standing balance support: Bilateral upper extremity supported Standing balance-Leahy Scale: Zero Standing balance comment: MaxA and bil UE support to try to take steps.                             Pertinent Vitals/Pain Pain Assessment: Faces Faces Pain Scale: Hurts little more Pain Location: bil legs Pain Descriptors / Indicators: Discomfort;Grimacing;Guarding Pain Intervention(s): Limited activity within patient's tolerance;Monitored during session;Repositioned    Home Living Family/patient expects to be discharged to:: Private residence Living Arrangements: Spouse/significant other;Parent (father) Available Help at Discharge: Family;Available 24 hours/day Type of Home: House Home Access: Ramped entrance       Home Layout: One level Home Equipment: Rollator (4 wheels);Shower seat;Grab bars - tub/shower      Prior Function Prior Level of Function : Independent/Modified Independent;Driving             Mobility Comments: Pt denies any recent falls. Pt not working, on disability.       Hand Dominance        Extremity/Trunk Assessment   Upper Extremity Assessment Upper Extremity Assessment: Defer to OT evaluation    Lower Extremity Assessment Lower Extremity Assessment: RLE deficits/detail;LLE deficits/detail;Generalized weakness RLE Deficits / Details: MMT scores of 4- to 4 grossly bil; denies numbness/tingling bil; hx of hip dysplasia LLE Deficits / Details: MMT scores of 4- to 4 grossly bil; denies numbness/tingling bil; hx of hip dysplasia    Cervical / Trunk Assessment Cervical / Trunk Assessment: Normal  Communication   Communication: No difficulties  Cognition Arousal/Alertness: Awake/alert Behavior During Therapy: Impulsive (emotional) Overall Cognitive Status: Impaired/Different from baseline Area of Impairment: Attention;Memory;Following  commands;Safety/judgement;Awareness;Problem solving                   Current Attention Level: Sustained Memory: Decreased short-term memory Following Commands: Follows one step commands consistently;Follows one step commands with increased time Safety/Judgement: Decreased awareness of safety;Decreased awareness of deficits Awareness: Emergent Problem Solving: Slow processing;Decreased initiation;Difficulty sequencing;Requires verbal cues General Comments: Pt needing repeated cues for hand placement with noted slow processing. Pt impulsively sitting prematurely several times during session, even though educated to warn PT if needs to sit. Poor awareness into extent of her deficits impacting her safety as pt was insistent she was going home tonight and that she would just stay in bed at home and her husband could carry her. Pt with memory deficits, stating "I don't know if my husbnad knows where I am since I switched rooms", asked RN and pt had not switched rooms and RN states husband was there earlier.        General Comments General comments (skin integrity, edema, etc.): SBP in 90s sitting after trying to ambulate, pt did report feeling lightheaded in standing; educated pt on benefits of rehab prior to returning home to maximize her return to baseline, but pt insistent on going home    Exercises     Assessment/Plan    PT Assessment Patient needs continued PT services  PT Problem List Decreased strength;Decreased activity tolerance;Decreased balance;Decreased mobility;Decreased coordination;Decreased cognition;Decreased knowledge of use of DME;Decreased safety awareness;Cardiopulmonary status limiting activity       PT Treatment Interventions DME instruction;Gait training;Therapeutic activities;Functional mobility training;Therapeutic exercise;Balance training;Neuromuscular re-education;Cognitive remediation;Patient/family education;Wheelchair mobility training    PT Goals  (Current goals can be found in the Care Plan section)  Acute Rehab PT Goals Patient Stated Goal: to go home today PT Goal Formulation:  With patient Time For Goal Achievement: 04/07/21 Potential to Achieve Goals: Good    Frequency Min 3X/week   Barriers to discharge        Co-evaluation               AM-PAC PT "6 Clicks" Mobility  Outcome Measure Help needed turning from your back to your side while in a flat bed without using bedrails?: A Little Help needed moving from lying on your back to sitting on the side of a flat bed without using bedrails?: A Little Help needed moving to and from a bed to a chair (including a wheelchair)?: A Lot Help needed standing up from a chair using your arms (e.g., wheelchair or bedside chair)?: A Lot Help needed to walk in hospital room?: Total Help needed climbing 3-5 steps with a railing? : Total 6 Click Score: 12    End of Session Equipment Utilized During Treatment: Gait belt Activity Tolerance: Patient tolerated treatment well Patient left: in bed;with call bell/phone within reach;with bed alarm set Nurse Communication: Mobility status;Other (comment) (possibly orthostatic) PT Visit Diagnosis: Unsteadiness on feet (R26.81);Other abnormalities of gait and mobility (R26.89);Muscle weakness (generalized) (M62.81);Difficulty in walking, not elsewhere classified (R26.2);Ataxic gait (R26.0)    Time: 5188-4166 PT Time Calculation (min) (ACUTE ONLY): 32 min   Charges:   PT Evaluation $PT Eval Moderate Complexity: 1 Mod PT Treatments $Therapeutic Activity: 8-22 mins        Moishe Spice, PT, DPT Acute Rehabilitation Services  Pager: [phone] Office: [phone]   Orvan Falconer 03/24/2021, 5:23 PM

## 2021-03-24 NOTE — TOC CAGE-AID Note (Signed)
Transition of Care [facility]) - CAGE-AID Screening   Patient Details  Name: Stacey Wong MRN: [id number] Date of Birth: 1981/04/23  Transition of Care [facility]) CM/SW Contact:    Bary Castilla, LCSW Phone Number: 03/24/2021, 1:20 PM   Clinical Narrative:  Pt states that she wants to get better and go home. Accepted SA resources and stated she had before for 2 years.   CAGE-AID Screening:    Have You Ever Felt You Ought to Cut Down on Your Drinking or Drug Use?: Yes Have People Annoyed You By Critizing Your Drinking Or Drug Use?: Yes Have You Felt Bad Or Guilty About Your Drinking Or Drug Use?: Yes Have You Ever Had a Drink or Used Drugs First Thing In The Morning to Steady Your Nerves or to Get Rid of a Hangover?: No CAGE-AID Score: 3  Substance Abuse Education Offered: Yes  Substance abuse interventions: Patient and Family Counseling, Scientist, clinical (histocompatibility and immunogenetics)

## 2021-03-24 NOTE — Social Work (Addendum)
CSW acknowledged SA consult and completed a CAGE-AIDE. Pt states that she want to get out of the hospital and get better. Pt was amenable to receiving SA resources. CSW inquired if pt has ever had treatment before and she stated that she has quit before in the past for 2 years. Pt's husband offered support during conversation. OPT resources were also provided with resources.

## 2021-03-24 NOTE — Progress Notes (Signed)
Voiding Trial  Instilled 358ml normal saline to existing foley catheter. When pt verbalized the need to urinate, Foley catheter removed,  pt able to void 417ml. Pt verbalized she feels much better.

## 2021-03-24 NOTE — Progress Notes (Signed)
0720 am Received pt, calm and cooperative, alert and oriented x4,restless at times.  Tube feeding ongoing. 8377 - Pt took meds by mouth whole without difficulty.

## 2021-03-24 NOTE — Progress Notes (Signed)
Received secure msg from MD, that during their rounds Cort[place] already out, pt pulled feeding tube. Diet was changed/upgraded  to  soft.

## 2021-03-24 NOTE — Progress Notes (Addendum)
HD#8 Subjective:  Overnight Events: No overnight events.  No CIWA score reported.   Patient was seen eating breakfast this morning. NG tube out this morning, patient is unsure when it came out but she possibly pulled it out herself. States that her feet are hurting her this morning and have been bugging her for years. No tremors, shaking, or feeling nervous.  No nausea or vomiting.  Patient is tearful and would like to talk to her husband, but she does not remember his number. Requesting we speak to him.   Objective:  Vital signs in last 24 hours: Vitals:   03/23/21 1155 03/23/21 2029 03/24/21 0351 03/24/21 0358  BP: 119/87 128/83 122/84   Pulse: (!) 109 (!) 106 (!) 104   Resp: 19 19 17    Temp: 98.4 F (36.9 C) 97.8 F (36.6 C) 97.6 F (36.4 C)   TempSrc: Oral Oral Oral   SpO2: (!) 89% 90% 90%   Weight:    52.3 kg  Height:       Supplemental O2: Room Air SpO2: 90 % O2 Flow Rate (L/min): 2 L/min   Physical Exam:  Physical Exam Constitutional:      Comments: No tremors, diaphoresis or vomiting.  HENT:     Head: Normocephalic.  Eyes:     General:        Right eye: No discharge.        Left eye: No discharge.     Conjunctiva/sclera: Conjunctivae normal.  Cardiovascular:     Rate and Rhythm: Regular rhythm. Tachycardia present.     Comments: No LE edema Pulmonary:     Effort: Pulmonary effort is normal. No respiratory distress.  Musculoskeletal:        General: Normal range of motion.     Comments: Right foot nontender to palpation, normal range of motion of ankle.  No swelling or erythema noted.  Could not examine the left foot given her laying position.  Skin:    Comments: Decreased skin turgor  Neurological:     Mental Status: She is alert.     Comments: Originally was calm however became tearful and requested to see her husband.    Filed Weights   03/22/21 0205 03/23/21 0138 03/24/21 0358  Weight: 57.6 kg 55.6 kg 52.3 kg     Intake/Output Summary (Last  24 hours) at 03/24/2021 0718 Last data filed at 03/24/2021 0606 Gross per 24 hour  Intake 2059.7 ml  Output 2600 ml  Net -540.3 ml   Net IO Since Admission: -5,050.24 mL [03/24/21 0718]  Pertinent Labs: CBC Latest Ref Rng & Units 03/24/2021 03/23/2021 03/22/2021  WBC 4.0 - 10.5 K/uL 10.3 8.5 7.7  Hemoglobin 12.0 - 15.0 g/dL 9.4(L) 9.1(L) 8.2(L)  Hematocrit 36.0 - 46.0 % 27.2(L) 25.6(L) 24.0(L)  Platelets 150 - 400 K/uL 527(H) 412(H) 340    CMP Latest Ref Rng & Units 03/24/2021 03/23/2021 03/23/2021  Glucose 70 - 99 mg/dL 109(H) 114(H) 124(H)  BUN 6 - 20 mg/dL 7 8 5(L)  Creatinine 0.44 - 1.00 mg/dL 0.37(L) 0.35(L) 0.35(L)  Sodium 135 - 145 mmol/L 126(L) 124(L) 128(L)  Potassium 3.5 - 5.1 mmol/L 5.0 5.3(H) 4.9  Chloride 98 - 111 mmol/L 95(L) 91(L) 95(L)  CO2 22 - 32 mmol/L 24 24 26   Calcium 8.9 - 10.3 mg/dL 8.6(L) 8.3(L) 8.3(L)  Total Protein 6.5 - 8.1 g/dL - - -  Total Bilirubin 0.3 - 1.2 mg/dL - - -  Alkaline Phos 38 - 126 U/L - - -  AST 15 - 41 U/L - - -  ALT 0 - 44 U/L - - -    Imaging: No results found.  Assessment/Plan:   Principal Problem:   Alcohol withdrawal (HCC) Active Problems:   Bipolar disorder (HCC)   Asthma   Panic disorder   Hypokalemia   Hypomagnesemia   Hypocalcemia   Macrocytic anemia   Elevated serum hCG   Hyperammonemia (HCC)   Cellulitis   Patient Summary: Stacey Wong is a 40 y.o. with a pertinent PMH of bipolar/behavioral health disorder, anemia, hypertension, chronic pain syndrome, alcohol use disorder, who is admitted for complicated alcohol w/d requiring ICU admission initially and transferred to our service.  She is now on Librium and CIWA with Ativan.  Plan: Alcohol withdrawal with delirium  No recorded CIWA score overnight.  RN states that patient has been calm and cooperative overnight.  No Ativan was given overnight.  Will taper Librium to 25 mg twice daily today and continue to monitor her symptoms. -Librium 25 mg  BID -Continue CIWA protocol with Ativan every 4 hours -Continue thiamine, folate, multivitamin -Will continue to monitor and replace electrolytes -TOC consult for potential rehab   Hyponatremia Sodium trending down steadily.  Suspect SIADH.  Will check serum awesome, serum awesome and urine sodium to confirm.  If confirms SIADH, will fluid restriction. Patient appears dry on exam.  CBC is also consistent with hemoconcentration.  Will start IV fluid if needed.  Protein calorie malnutrition Patient has pulled out the feeding tube today.  She was tolerating liquid diet well.  Will advance to soft diet.  Bipolar disorder Psychiatry has been consulted and are making medication recommendations -Continue Seroquel per psychiatry's recommendations   Urinary retention:  Will attempt voiding trial today.  Bladder scan every 4 hours. -Continue Flomax   Best Practice: Diet: soft IVF: Fluids: none, Rate: None VTE: xarelto Code: Full Therapy Recs: Pending, DME: none DISPO: Anticipated discharge  TBD  to  TBD  pending Medical stability.  Gaylan Gerold, DO 03/24/2021, 7:18 AM Pager: [phone]  Please contact the on call pager after 5 pm and on weekends at [phone].

## 2021-03-24 NOTE — Consult Note (Signed)
[facility] Face-to-Face Psychiatry Consult   Reason for Consult:  Alcohol withdrawal agitation, Hx of Bipolar disorder, currently untreated Referring Physician:  Marshell Garfinkel, MD Patient Identification: Stacey Wong MRN:  [id number] Principal Diagnosis: Alcohol withdrawal ([place]) Diagnosis:  Principal Problem:   Alcohol withdrawal ([place]) Active Problems:   Bipolar disorder ([place])   Asthma   Panic disorder   Hypokalemia   Hypomagnesemia   Hypocalcemia   Macrocytic anemia   Elevated serum hCG   Hyperammonemia ([place])   Cellulitis  Assessment  Stacey Wong is a 40 y.o. female admitted medically 03/16/2021 52:77 PM for complicated EtoH withdrawal.Patient carries the psychiatric diagnoses of Bipolar disorder and has a past medical history of  EtoH use disorder,HTN, asthma, chornic pain 2/2 hip dysplasia .Psychiatry was consulted for cocern for agitation and uncontrolled Bipolar disorder.   She meets criteria for EtOH use disorder based on presentation and hx per EMR and family collateral.  Patient's initial assessment was hindered by her present delirium, and much information has been gathered from husband however she does endorse a possible recent manic episode and her husband was able to corroborate behaviors concerning for bipolar disorder. Outpatient psychotropic medications included lamictal in the past and historically she has had a good response to these medications. She was not compliant with medications prior to admission as evidenced by patient and patinet's husband endorsing that patient has been unmedicated for ~4 years . On initial examination, patient appeared delirious but attempted to do her best to participate on assessment.  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 We have been managing primarily with  quetiapine for agitation/mood stabilization; long-term would likley respond well to divalproex however cannot prescribe at this time 2/2 no contraception and elevated ALTs. As patient gets closer to discharge, will consider medications for severe EtOH use d/o (ie naltrexone and/or gabapentin) and outpatient options; currently pt's primary team is managing w/d and BZD taper and we have taken a background role. Most recently pt has developed significant hyponatremia concerning for SIADH; have discussed risk that (if present) this is induced by quetiapine with primary team and if quetiapine is felt to be most likely cause (sodium did begin dropping after medication start), would stop this medication and start olanzapine 5 mg QHS. SIADH/hyponatremia is a small but real risk with all antipsychotics, but hyponatremia in response to one antipsychotic does not mean another will not be well tolerated. Favor starting olanzapine over holding all antipsychotics d/t pt w/ significant insomnia and real hx bipolar d/o.    On eval today 11/26, pt appeared more alert and attentive (was disoriented) and frustrated by length of hospital stay/ability to get out of bed  EtoH withdrawal, complicated EtOH use disorder - Detox per primary - Recommend SW consult for rehab or detox options - consider naltrexone (if LFTs stabilize, will need to discuss opoioid use d/o in more detail) and/or gabapentin when more stable for this - will extend time on high dose IV thiamine x3 days, pt malnourished, prolonged AMS, and MCV continues to climb.    Hx Bipolar disorder - Continue Seroquel 50mg  daily 200mg  QHS  - discussed potential change as above with primary team.  - Patient precedex discontinued 11/23 - Recommend SW consult for OP psychiatry options (likely [facility] in [facility])   Tobacco use disorder - Continue Nicotine patch   Safety - At  this time patient appears to be of low risk of danger to herself or others.  Recommend routine observation.   Dispo - Per primary    Total Time spent with patient: 15 minutes  Subjective:   Stacey Wong is a 40 y.o. female patient admitted with EtoH withdrawal.  HPI:  Pt seen in late AM. She is disoriented to month even when prompted with recent Thanksgiving holiday (thought it was January). She did not understand why she was in the hospital (discussed EtOH withdrawal) and discussed frustration with length of stay and desire to talk to husband and go home. Frustrated with being stuck in bed. Discussed potential medication change although unclear how well this was understood. No SI, HI, AH/VH - nurse at bedside notes pt has not been seen [facility] since the night before last. Excited about being advanced to full diet. Notes hx of insomnia but states sleeping well recently (likely due to quetiapine). Able to count 42-->27 with no issue. Noted muscle wasting in b/l LE.   Past Psychiatric History: Bipolar disorder, tx with lamictal in past. Severe EtOH use d/o. See initial note for full psych hx.   Past Medical History:  Past Medical History:  Diagnosis Date   Asthma    Bipolar 1 disorder ([place])    Hypertension     Past Surgical History:  Procedure Laterality Date   CHOLECYSTECTOMY     Family History: History reviewed. No pertinent family history.  Social History:  Social History   Substance and Sexual Activity  Alcohol Use Yes   Alcohol/week: 0.0 standard drinks   Comment: occasionally     Social History   Substance and Sexual Activity  Drug Use No    Social History   Socioeconomic History   Marital status: Divorced    Spouse name: Not on file   Number of children: Not on file   Years of education: Not on file   Highest education level: Not on file  Occupational History   Not on file  Tobacco Use   Smoking status: Every Day    Packs/day: 1.00    Types: Cigarettes   Smokeless tobacco: Never   Tobacco comments:    decreased smoking  Substance  and Sexual Activity   Alcohol use: Yes    Alcohol/week: 0.0 standard drinks    Comment: occasionally   Drug use: No   Sexual activity: Not on file  Other Topics Concern   Not on file  Social History Narrative   Not on file   Social Determinants of Health   Financial Resource Strain: Not on file  Food Insecurity: Not on file  Transportation Needs: Not on file  Physical Activity: Not on file  Stress: Not on file  Social Connections: Not on file   Additional Social History:    Allergies:  No Known Allergies  Labs:  Results for orders placed or performed during the hospital encounter of 03/16/21 (from the past 48 hour(s))  Glucose, capillary     Status: Abnormal   Collection Time: 03/22/21  4:24 PM  Result Value Ref Range   Glucose-Capillary 126 (H) 70 - 99 mg/dL    Comment: Glucose reference range applies only to samples taken after fasting for at least 8 hours.  Glucose, capillary     Status: Abnormal   Collection Time: 03/22/21  8:40 PM  Result Value Ref Range   Glucose-Capillary 137 (H) 70 - 99 mg/dL    Comment: Glucose reference range applies only to samples  taken after fasting for at least 8 hours.  Glucose, capillary     Status: Abnormal   Collection Time: 03/23/21 12:33 AM  Result Value Ref Range   Glucose-Capillary 133 (H) 70 - 99 mg/dL    Comment: Glucose reference range applies only to samples taken after fasting for at least 8 hours.  CBC     Status: Abnormal   Collection Time: 03/23/21  3:36 AM  Result Value Ref Range   WBC 8.5 4.0 - 10.5 K/uL   RBC 2.19 (L) 3.87 - 5.11 MIL/uL   Hemoglobin 9.1 (L) 12.0 - 15.0 g/dL   HCT 25.6 (L) 36.0 - 46.0 %   MCV 116.9 (H) 80.0 - 100.0 fL   MCH 41.6 (H) 26.0 - 34.0 pg   MCHC 35.5 30.0 - 36.0 g/dL   RDW 18.6 (H) 11.5 - 15.5 %   Platelets 412 (H) 150 - 400 K/uL   nRBC 0.4 (H) 0.0 - 0.2 %    Comment: Performed at [facility] [address] [place], [place] [address]  Basic metabolic panel     Status: Abnormal    Collection Time: 03/23/21  3:36 AM  Result Value Ref Range   Sodium 128 (L) 135 - 145 mmol/L   Potassium 4.9 3.5 - 5.1 mmol/L   Chloride 95 (L) 98 - 111 mmol/L   CO2 26 22 - 32 mmol/L   Glucose, Bld 124 (H) 70 - 99 mg/dL    Comment: Glucose reference range applies only to samples taken after fasting for at least 8 hours.   BUN 5 (L) 6 - 20 mg/dL   Creatinine, Ser 0.35 (L) 0.44 - 1.00 mg/dL   Calcium 8.3 (L) 8.9 - 10.3 mg/dL   GFR, Estimated >60 >60 mL/min    Comment: (NOTE) Calculated using the CKD-EPI Creatinine Equation (2021)    Anion gap 7 5 - 15    Comment: Performed at [facility] [address], [place], [place] [address]  Magnesium     Status: Abnormal   Collection Time: 03/23/21  3:36 AM  Result Value Ref Range   Magnesium 1.5 (L) 1.7 - 2.4 mg/dL    Comment: Performed at [facility] [address], [place], [place] [address]  Phosphorus     Status: Abnormal   Collection Time: 03/23/21  3:36 AM  Result Value Ref Range   Phosphorus 6.6 (H) 2.5 - 4.6 mg/dL    Comment: Performed at [facility] [address] [place], [place] [address]  Glucose, capillary     Status: Abnormal   Collection Time: 03/23/21  4:10 AM  Result Value Ref Range   Glucose-Capillary 132 (H) 70 - 99 mg/dL    Comment: Glucose reference range applies only to samples taken after fasting for at least 8 hours.  Glucose, capillary     Status: Abnormal   Collection Time: 03/23/21  7:51 AM  Result Value Ref Range   Glucose-Capillary 122 (H) 70 - 99 mg/dL    Comment: Glucose reference range applies only to samples taken after fasting for at least 8 hours.  Glucose, capillary     Status: Abnormal   Collection Time: 03/23/21 11:53 AM  Result Value Ref Range   Glucose-Capillary 134 (H) 70 - 99 mg/dL    Comment: Glucose reference range applies only to samples taken after fasting for at least 8 hours.  Glucose, capillary     Status: Abnormal   Collection Time: 03/23/21  4:34 PM   Result Value Ref Range   Glucose-Capillary 142 (H) 70 - 99 mg/dL    Comment: Glucose reference range applies only to samples taken after fasting for at least 8 hours.  Basic metabolic panel     Status: Abnormal   Collection Time: 03/23/21  7:15 PM  Result Value Ref Range   Sodium 124 (L) 135 - 145 mmol/L   Potassium 5.3 (H) 3.5 - 5.1 mmol/L   Chloride 91 (L) 98 - 111 mmol/L   CO2 24 22 - 32 mmol/L   Glucose, Bld 114 (H) 70 - 99 mg/dL    Comment: Glucose reference range applies only to samples taken after fasting for at least 8 hours.   BUN 8 6 - 20 mg/dL   Creatinine, Ser 0.35 (L) 0.44 - 1.00 mg/dL   Calcium 8.3 (L) 8.9 - 10.3 mg/dL   GFR, Estimated >60 >60 mL/min    Comment: (NOTE) Calculated using the CKD-EPI Creatinine Equation (2021)    Anion gap 9 5 - 15    Comment: Performed at [facility] [address] [place], [place] [address]  Glucose, capillary     Status: Abnormal   Collection Time: 03/23/21  8:27 PM  Result Value Ref Range   Glucose-Capillary 166 (H) 70 - 99 mg/dL    Comment: Glucose reference range applies only to samples taken after fasting for at least 8 hours.   Comment 1 Notify RN    Comment 2 Document in Chart   Glucose, capillary     Status: Abnormal   Collection Time: 03/24/21 12:08 AM  Result Value Ref Range   Glucose-Capillary 134 (H) 70 - 99 mg/dL    Comment: Glucose reference range applies only to samples taken after fasting for at least 8 hours.  Basic metabolic panel     Status: Abnormal   Collection Time: 03/24/21  3:23 AM  Result Value Ref Range   Sodium 126 (L) 135 - 145 mmol/L   Potassium 5.0 3.5 - 5.1 mmol/L   Chloride 95 (L) 98 - 111 mmol/L   CO2 24 22 - 32 mmol/L   Glucose, Bld 109 (H) 70 - 99 mg/dL    Comment: Glucose reference range applies only to samples taken after fasting for at least 8 hours.   BUN 7 6 - 20 mg/dL   Creatinine, Ser 0.37 (L) 0.44 - 1.00 mg/dL   Calcium 8.6 (L) 8.9 - 10.3 mg/dL   GFR, Estimated >60 >60  mL/min    Comment: (NOTE) Calculated using the CKD-EPI Creatinine Equation (2021)    Anion gap 7 5 - 15    Comment: Performed at [facility] [address] [place], [place] [address]  CBC     Status: Abnormal   Collection Time: 03/24/21  3:23 AM  Result Value Ref Range   WBC 10.3 4.0 - 10.5 K/uL   RBC 2.31 (L) 3.87 - 5.11 MIL/uL   Hemoglobin 9.4 (L) 12.0 - 15.0 g/dL   HCT 27.2 (L) 36.0 - 46.0 %   MCV 117.7 (H) 80.0 - 100.0 fL   MCH 40.7 (H) 26.0 - 34.0 pg   MCHC 34.6 30.0 - 36.0 g/dL   RDW 18.2 (H) 11.5 - 15.5 %   Platelets 527 (H) 150 - 400 K/uL   nRBC 0.0 0.0 - 0.2 %    Comment: Performed at [facility], [place] [address] [place], [place] [address]  Glucose, capillary  Status: Abnormal   Collection Time: 03/24/21  4:28 AM  Result Value Ref Range   Glucose-Capillary 167 (H) 70 - 99 mg/dL    Comment: Glucose reference range applies only to samples taken after fasting for at least 8 hours.   Comment 1 Notify RN    Comment 2 Document in Chart   Glucose, capillary     Status: Abnormal   Collection Time: 03/24/21  7:40 AM  Result Value Ref Range   Glucose-Capillary 134 (H) 70 - 99 mg/dL    Comment: Glucose reference range applies only to samples taken after fasting for at least 8 hours.  Osmolality     Status: Abnormal   Collection Time: 03/24/21  8:15 AM  Result Value Ref Range   Osmolality 264 (L) 275 - 295 mOsm/kg    Comment: Performed at [facility], [place] [address] [place], [place] [address]  Sodium, urine, random     Status: None   Collection Time: 03/24/21 10:22 AM  Result Value Ref Range   Sodium, Ur 65 mmol/L    Comment: Performed at [facility] [address], [place], [place] [address]  Osmolality, urine     Status: None   Collection Time: 03/24/21 10:22 AM  Result Value Ref Range   Osmolality, Ur 445 300 - 900 mOsm/kg    Comment: Performed at [facility] [address] [place], [place] [address]  Glucose, capillary      Status: Abnormal   Collection Time: 03/24/21 11:30 AM  Result Value Ref Range   Glucose-Capillary 101 (H) 70 - 99 mg/dL    Comment: Glucose reference range applies only to samples taken after fasting for at least 8 hours.    Current Facility-Administered Medications  Medication Dose Route Frequency Provider Last Rate Last Admin   acetaminophen (TYLENOL) tablet 500 mg  500 mg Oral Q6H PRN Corky Sox, MD   500 mg at 03/24/21 0331   chlordiazePOXIDE (LIBRIUM) capsule 25 mg  25 mg Oral BID Gaylan Gerold, DO       chlorhexidine (PERIDEX) 0.12 % solution 15 mL  15 mL Mouth Rinse BID Harvie Heck, MD   15 mL at 03/24/21 0835   Chlorhexidine Gluconate Cloth 2 % PADS 6 each  6 each Topical Q0600 Renee Pain, MD   6 each at 03/24/21 0836   diclofenac Sodium (VOLTAREN) 1 % topical gel 2 g  2 g Topical BID PRN Gaylan Gerold, DO   2 g at 03/24/21 8182   docusate sodium (COLACE) capsule 100 mg  100 mg Oral BID Marianna Payment, MD   100 mg at 03/24/21 0836   feeding supplement (JEVITY 1.2 CAL) liquid 1,000 mL  1,000 mL Per Tube Continuous Marshell Garfinkel, MD   Stopped at 99/37/16 9678   folic acid (FOLVITE) tablet 1 mg  1 mg Oral Daily Gaylan Gerold, DO   1 mg at 03/24/21 0837   insulin aspart (novoLOG) injection 0-9 Units  0-9 Units Subcutaneous Q4H PRN Harvie Heck, MD   2 Units at 03/24/21 0444   lip balm (CARMEX) ointment   Topical PRN Spero Geralds, MD       LORazepam (ATIVAN) injection 1-4 mg  1-4 mg Intravenous Q1H PRN Lestine Mount, PA-C   2 mg at 03/23/21 9381   Or   LORazepam (ATIVAN) tablet 1-4 mg  1-4 mg Oral Q1H PRN Lestine Mount, PA-C       MEDLINE mouth rinse  15  mL Mouth Rinse q12n4p Aslam, Loralyn Freshwater, MD   15 mL at 03/23/21 1630   multivitamin with minerals tablet 1 tablet  1 tablet Oral Daily Gaylan Gerold, DO   1 tablet at 03/24/21 0836   nicotine (NICODERM CQ - dosed in mg/24 hours) patch 21 mg  21 mg Transdermal Daily Corky Sox, MD   21 mg at 03/24/21 0835    polyethylene glycol (MIRALAX / GLYCOLAX) packet 17 g  17 g Oral Daily Marianna Payment, MD   17 g at 03/24/21 0835   QUEtiapine (SEROQUEL) tablet 200 mg  200 mg Per Tube QHS Mannam, Praveen, MD   200 mg at 03/23/21 2117   QUEtiapine (SEROQUEL) tablet 50 mg  50 mg Oral Daily Corky Sox, MD   50 mg at 03/24/21 1203   rivaroxaban (XARELTO) tablet 10 mg  10 mg Oral Daily Gaylan Gerold, DO   10 mg at 03/24/21 1203   sodium chloride 0.9 % bolus 1,000 mL  1,000 mL Intravenous Once Gaylan Gerold, DO       tamsulosin [facility]) capsule 0.4 mg  0.4 mg Oral Daily Aslam, Sadia, MD   0.4 mg at 03/24/21 0836   thiamine (B-1) 400 mg in sodium chloride 0.9 % 50 mL IVPB  400 mg Intravenous TID Kathleene Bergemann A       Followed by   Derrill Memo ON 03/28/2021] thiamine (B-1) injection 100 mg  100 mg Intravenous Daily Chaselynn Kepple A          Psychiatric Specialty Exam:  Presentation  General Appearance: Disheveled  Eye Contact:Fair  Speech:Clear and Coherent; Slow  Speech Volume:Decreased  Handedness:No data recorded  Mood and Affect  Mood:-- (frustrated)  Affect:Congruent (occ tearful)   Thought Process  Thought Processes:Goal Directed  Descriptions of Associations:Circumstantial  Orientation:Partial (not oriented to time, situation)  Thought Content:Scattered  History of Schizophrenia/Schizoaffective disorder:No data recorded Duration of Psychotic Symptoms:No data recorded Hallucinations:Hallucinations: None  Ideas of Reference:None  Suicidal Thoughts:Suicidal Thoughts: No  Homicidal Thoughts:Homicidal Thoughts: No   Sensorium  Memory:Immediate Poor; Recent Poor; Remote Fair  Judgment:Impaired  Insight:Lacking   Executive Functions  Concentration:Poor  Attention Span:Good  Recall:No data recorded Fund of Knowledge:Fair  Language:Poor   Psychomotor Activity  Psychomotor Activity:Psychomotor Activity: Decreased   Assets  Assets:Social Support   Sleep   Sleep:Sleep: Good    Physical Exam: Physical Exam Constitutional:      Comments: Drowsy, but not falling asleep during conversation  HENT:     Head: Normocephalic and atraumatic.  Pulmonary:     Effort: Pulmonary effort is normal.  Neurological:     Mental Status: She is disoriented.   Review of Systems  Psychiatric/Behavioral:  Negative for depression, hallucinations and suicidal ideas. The patient does not have insomnia.   Blood pressure 122/84, pulse (!) 104, temperature 97.6 F (36.4 C), temperature source Oral, resp. rate 17, height 5\' 4"  (1.626 m), weight 52.3 kg, SpO2 90 %. Body mass index is 19.79 kg/m.    Joycelyn Schmid A Trudie Cervantes 03/24/2021 12:09 PM

## 2021-03-25 LAB — CBC
HCT: 26.2 % — ABNORMAL LOW (ref 36.0–46.0)
Hemoglobin: 9.1 g/dL — ABNORMAL LOW (ref 12.0–15.0)
MCH: 40.6 pg — ABNORMAL HIGH (ref 26.0–34.0)
MCHC: 34.7 g/dL (ref 30.0–36.0)
MCV: 117 fL — ABNORMAL HIGH (ref 80.0–100.0)
Platelets: 600 10*3/uL — ABNORMAL HIGH (ref 150–400)
RBC: 2.24 MIL/uL — ABNORMAL LOW (ref 3.87–5.11)
RDW: 17.6 % — ABNORMAL HIGH (ref 11.5–15.5)
WBC: 6 10*3/uL (ref 4.0–10.5)
nRBC: 0 % (ref 0.0–0.2)

## 2021-03-25 LAB — BASIC METABOLIC PANEL
Anion gap: 6 (ref 5–15)
BUN: 6 mg/dL (ref 6–20)
CO2: 24 mmol/L (ref 22–32)
Calcium: 8.4 mg/dL — ABNORMAL LOW (ref 8.9–10.3)
Chloride: 100 mmol/L (ref 98–111)
Creatinine, Ser: 0.42 mg/dL — ABNORMAL LOW (ref 0.44–1.00)
GFR, Estimated: >60 mL/min (ref >60)
Glucose, Bld: 101 mg/dL — ABNORMAL HIGH (ref 70–99)
Potassium: 4.3 mmol/L (ref 3.5–5.1)
Sodium: 130 mmol/L — ABNORMAL LOW (ref 135–145)

## 2021-03-25 LAB — GLUCOSE, CAPILLARY
Glucose-Capillary: 96 mg/dL (ref 70–99)
Glucose-Capillary: 101 mg/dL — ABNORMAL HIGH (ref 70–99)
Glucose-Capillary: 130 mg/dL — ABNORMAL HIGH (ref 70–99)
Glucose-Capillary: 101 mg/dL — ABNORMAL HIGH (ref 70–99)

## 2021-03-25 LAB — PHOSPHORUS: Phosphorus: 4.9 mg/dL — ABNORMAL HIGH (ref 2.5–4.6)

## 2021-03-25 LAB — MAGNESIUM: Magnesium: 1.8 mg/dL (ref 1.7–2.4)

## 2021-03-25 MED ORDER — INSULIN ASPART 100 UNIT/ML IJ SOLN
0.0000 [IU] | Freq: Three times a day (TID) | INTRAMUSCULAR | Status: DC
  Administered 2021-03-25 – 2021-03-26 (×2): 2 [IU] via SUBCUTANEOUS

## 2021-03-25 MED ORDER — CHLORDIAZEPOXIDE HCL 25 MG PO CAPS
25.0000 mg | ORAL_CAPSULE | Freq: Every day | ORAL | Status: DC
  Administered 2021-03-25: 20:00:00 25 mg via ORAL
  Filled 2021-03-25: qty 1

## 2021-03-25 NOTE — Progress Notes (Signed)
Inpatient Rehab Admissions Coordinator:   Per therapy recommendations.  patient was screened for CIR candidacy by Clemens Catholic, MS, CCC-SLP   At this time, Pt. is not medically ready for CIR. I will not pursue a rehab consult for this Pt. at this time. She is currently in acute withdrawals, on CIWA and functional deficits may resolve as withdrawal resolves.  CIR admissions team will follow and monitor for medical readiness and place consult order if Pt. appears to be an appropriate candidate once withdrawals resolve. Please contact me with any questions.    Clemens Catholic, [place], [place] Admissions Coordinator  [phone] ([place]) [phone] (office)

## 2021-03-25 NOTE — Evaluation (Signed)
Occupational Therapy Evaluation Patient Details Name: Stacey Wong MRN: [id number] DOB: 09-10-1980 Today's Date: 03/25/2021   History of Present Illness Pt is a 40 y.o. female who presented 03/16/21 with acute encephalopathy. Pt had stopped drinking x2 days PTA. Pt admitted for complicated alcohol w/d requiring ICU admission. S/p cortrak placement 11/21. CT of head negative for acute intracranial process. PMH of alcohol use disorder, bipolar disorder, hip dysplasia, anemia, HTN, chronic pain syndrome   Clinical Impression   Pt admitted with above. She demonstrates the below listed deficits and will benefit from continued OT to maximize safety and independence with BADLs.  Pt presents to OT with significant balance deficits, generalized weakness, decreased activity tolerance, and significant cognitive impairment including impaired safety/judgement, impaired awareness of deficits and impact on function, impaired problem solving, organization, sequencing, and memory.   She currently requires max A for LB ADLs, and max A for pivot transfers.  She required max A to prevent fall during toilet transfer as pt abruptly attempted to sit without warning.  She reports she lives with her spouse and was mod I PTA.   Strongly recommend post acute rehab prior to discharge home as she is at very high risk of falls.  Pt, however, with poor awareness and insistent on going home so she can have a cigarette and see her family.  Long frank discussion with pt re: need for post acute rehab, risk for falls, and decompensation.   Will follow acutely.        Recommendations for follow up therapy are one component of a multi-disciplinary discharge planning process, led by the attending physician.  Recommendations may be updated based on patient status, additional functional criteria and insurance authorization.   Follow Up Recommendations  Acute inpatient rehab (3hours/day)    Assistance Recommended at Discharge Frequent or  constant Supervision/Assistance  Functional Status Assessment  Patient has had a recent decline in their functional status and demonstrates the ability to make significant improvements in function in a reasonable and predictable amount of time.  Equipment Recommendations  BSC/3in1;Wheelchair (measurements OT);Wheelchair cushion (measurements OT)    Recommendations for Other Services Rehab consult     Precautions / Restrictions Precautions Precautions: Fall      Mobility Bed Mobility Overal bed mobility: Needs Assistance Bed Mobility: Supine to Sit;Sit to Supine     Supine to sit: Supervision;HOB elevated Sit to supine: Supervision;HOB elevated   General bed mobility comments: Supervision for safety.    Transfers                          Balance Overall balance assessment: Needs assistance Sitting-balance support: No upper extremity supported;Feet supported Sitting balance-Leahy Scale: Good Sitting balance - Comments: Static sitting EOB with supervision for safety.   Standing balance support: Bilateral upper extremity supported Standing balance-Leahy Scale: Zero Standing balance comment: MaxA and bil UE support to try to take steps.                           ADL either performed or assessed with clinical judgement   ADL Overall ADL's : Needs assistance/impaired Eating/Feeding: Independent   Grooming: Wash/dry hands;Wash/dry face;Oral care;Brushing hair;Set up;Sitting   Upper Body Bathing: Supervision/ safety;Set up;Sitting   Lower Body Bathing: Maximal assistance;Sit to/from stand   Upper Body Dressing : Set up;Supervision/safety;Sitting   Lower Body Dressing: Maximal assistance;Sit to/from stand   Toilet Transfer: Maximal assistance;Stand-pivot;BSC/3in1 Armed forces technical officer Details (indicate  cue type and reason): Pt initially stated she could ambulate to the bathroom.  Attempted step pivot to the [facility] using RW, however, pt required constant cues  for sequencing and problem solving, then abruptly sat requiring max A to move her to the bed to prevent fall.   Pt subsequently with no awareness that she almost fell.  Pt required max cues and max A to stand pivot to the [facility] with therapist's assist Toileting- Clothing Manipulation and Hygiene: Maximal assistance;Sit to/from stand;Sitting/lateral lean Toileting - Clothing Manipulation Details (indicate cue type and reason): able to perform peri care with lateral leans, but requires total A for clothing manipulation     Functional mobility during ADLs: Maximal assistance       Vision Patient Visual Report: No change from baseline       Perception     Praxis      Pertinent Vitals/Pain Pain Assessment: Faces Faces Pain Scale: No hurt     Hand Dominance     Extremity/Trunk Assessment Upper Extremity Assessment Upper Extremity Assessment: Generalized weakness   Lower Extremity Assessment Lower Extremity Assessment: Defer to PT evaluation   Cervical / Trunk Assessment Cervical / Trunk Assessment: Normal   Communication Communication Communication: No difficulties   Cognition Arousal/Alertness: Awake/alert Behavior During Therapy: Impulsive;Anxious (emotional) Overall Cognitive Status: Impaired/Different from baseline Area of Impairment: Attention;Memory;Following commands;Safety/judgement;Awareness;Problem solving                   Current Attention Level: Sustained Memory: Decreased short-term memory Following Commands: Follows one step commands consistently;Follows one step commands with increased time Safety/Judgement: Decreased awareness of safety;Decreased awareness of deficits Awareness: Intellectual Problem Solving: Slow processing;Decreased initiation;Difficulty sequencing;Requires verbal cues General Comments: Pt fixated on wanting to go home.  She initially stated she was going to walk to the BR to get stronger with no awareness of her deficits and  inability to walk to the BR.  Pt requires instructions repeated frequently due to impaired memory, anxiety, and poor awareness.  She abruptly sat mid transfer to the toilet requiring max A to prevent fall as she was too far away from the bed to sit safely.  Long frank discussion with pt re: need for inpatient rehab, however, pt states she needs to be comfortable and needs to have a cigarette     General Comments  BP stable during session.  HR 119-126    Exercises     Shoulder Instructions      Home Living Family/patient expects to be discharged to:: Private residence Living Arrangements: Spouse/significant other;Parent (father) Available Help at Discharge: Family;Available 24 hours/day Type of Home: House Home Access: Ramped entrance     Home Layout: One level     Bathroom Shower/Tub: Teacher, early years/pre: Standard     Home Equipment: Rollator (4 wheels);Shower seat;Grab bars - tub/shower          Prior Functioning/Environment Prior Level of Function : Independent/Modified Independent;Driving             Mobility Comments: Pt denies any recent falls. Pt not working, on disability.          OT Problem List: Decreased strength;Decreased activity tolerance;Impaired balance (sitting and/or standing);Decreased cognition;Decreased safety awareness;Decreased knowledge of use of DME or AE      OT Treatment/Interventions: Self-care/ADL training;DME and/or AE instruction;Therapeutic activities;Cognitive remediation/compensation;Patient/family education;Balance training;Therapeutic exercise;Neuromuscular education    OT Goals(Current goals can be found in the care plan section) Acute Rehab OT Goals Patient Stated Goal: to  go home and go to OP for therapy OT Goal Formulation: With patient Time For Goal Achievement: 03/25/21 Potential to Achieve Goals: Good ADL Goals Pt Will Perform Grooming: with min assist;standing Pt Will Perform Lower Body Bathing: with  min assist;sit to/from stand Pt Will Perform Lower Body Dressing: with min assist;sit to/from stand Pt Will Transfer to Toilet: with min assist;ambulating;regular height toilet;bedside commode;grab bars Pt Will Perform Toileting - Clothing Manipulation and hygiene: with min assist;sit to/from stand Pt/caregiver will Perform Home Exercise Program: Increased strength;Right Upper extremity;Left upper extremity;With theraband;With Supervision;With written HEP provided Additional ADL Goal #1: Pt will demonstrate anticipatory awareness of deficits with min cues  OT Frequency: Min 3X/week   Barriers to D/C:            Co-evaluation              AM-PAC OT "6 Clicks" Daily Activity     Outcome Measure Help from another person eating meals?: None Help from another person taking care of personal grooming?: A Little Help from another person toileting, which includes using toliet, bedpan, or urinal?: A Lot Help from another person bathing (including washing, rinsing, drying)?: A Lot Help from another person to put on and taking off regular upper body clothing?: A Little Help from another person to put on and taking off regular lower body clothing?: A Lot 6 Click Score: 16   End of Session Equipment Utilized During Treatment: Gait belt;Rolling walker (2 wheels) Nurse Communication: Mobility status (Pivot transfers only with +2 assist)  Activity Tolerance: Patient tolerated treatment well Patient left: in bed;with call bell/phone within reach;with bed alarm set  OT Visit Diagnosis: Unsteadiness on feet (R26.81);Cognitive communication deficit (R41.841)                Time: 3154-0086 OT Time Calculation (min): 31 min Charges:  OT General Charges $OT Visit: 1 Visit OT Evaluation $OT Eval Moderate Complexity: 1 Mod OT Treatments $Self Care/Home Management : 8-22 mins  Nilsa Nutting., OTR/L Acute Rehabilitation Services Pager [phone] Office [place], [place] 03/25/2021, 9:56 AM

## 2021-03-25 NOTE — Progress Notes (Signed)
HD#9 Subjective:  Overnight Events: No event overnight  Patient is seen and prefers to examination.  She is tolerating solid foods well and drinking water.  She reports feeling well, denies nausea, vomiting, tremors or anxiety.  We discussed possible CIR but patient wants to go home.  Objective:  Vital signs in last 24 hours: Vitals:   03/24/21 1225 03/24/21 2014 03/25/21 0240 03/25/21 0424  BP: 123/88 123/87 (!) 132/93 (!) 142/96  Pulse: (!) 104 (!) 109 100 (!) 109  Resp: 13 16 15 15   Temp: 97.6 F (36.4 C) 97.6 F (36.4 C) 97.6 F (36.4 C) 98.3 F (36.8 C)  TempSrc: Oral Oral Oral Oral  SpO2: 100% 98% 96% 96%  Weight:   49.9 kg   Height:       Supplemental O2: Room Air SpO2: 96 % O2 Flow Rate (L/min): 2 L/min   Physical Exam:  Physical Exam Constitutional:      General: She is not in acute distress. HENT:     Head: Normocephalic.  Eyes:     General:        Right eye: No discharge.        Left eye: No discharge.     Conjunctiva/sclera: Conjunctivae normal.  Cardiovascular:     Rate and Rhythm: Normal rate and regular rhythm.  Pulmonary:     Effort: Pulmonary effort is normal. No respiratory distress.  Skin:    General: Skin is warm.     Coloration: Skin is not jaundiced.  Neurological:     Mental Status: She is alert.  Psychiatric:        Mood and Affect: Mood normal.        Behavior: Behavior normal.    Filed Weights   03/23/21 0138 03/24/21 0358 03/25/21 0240  Weight: 55.6 kg 52.3 kg 49.9 kg     Intake/Output Summary (Last 24 hours) at 03/25/2021 0630 Last data filed at 03/25/2021 0600 Gross per 24 hour  Intake 759.36 ml  Output 1825 ml  Net -1065.64 ml   Net IO Since Admission: -6,115.88 mL [03/25/21 0630]  Pertinent Labs: CBC Latest Ref Rng & Units 03/25/2021 03/24/2021 03/23/2021  WBC 4.0 - 10.5 K/uL 6.0 10.3 8.5  Hemoglobin 12.0 - 15.0 g/dL 9.1(L) 9.4(L) 9.1(L)  Hematocrit 36.0 - 46.0 % 26.2(L) 27.2(L) 25.6(L)  Platelets 150 - 400  K/uL 600(H) 527(H) 412(H)    CMP Latest Ref Rng & Units 03/25/2021 03/24/2021 03/23/2021  Glucose 70 - 99 mg/dL 101(H) 109(H) 114(H)  BUN 6 - 20 mg/dL 6 7 8   Creatinine 0.44 - 1.00 mg/dL 0.42(L) 0.37(L) 0.35(L)  Sodium 135 - 145 mmol/L 130(L) 126(L) 124(L)  Potassium 3.5 - 5.1 mmol/L 4.3 5.0 5.3(H)  Chloride 98 - 111 mmol/L 100 95(L) 91(L)  CO2 22 - 32 mmol/L 24 24 24   Calcium 8.9 - 10.3 mg/dL 8.4(L) 8.6(L) 8.3(L)  Total Protein 6.5 - 8.1 g/dL - - -  Total Bilirubin 0.3 - 1.2 mg/dL - - -  Alkaline Phos 38 - 126 U/L - - -  AST 15 - 41 U/L - - -  ALT 0 - 44 U/L - - -    Imaging: No results found.  Assessment/Plan:   Principal Problem:   Alcohol withdrawal (HCC) Active Problems:   Bipolar disorder (HCC)   Asthma   Panic disorder   Hypokalemia   Hypomagnesemia   Hypocalcemia   Macrocytic anemia   Elevated serum hCG   Hyperammonemia (HCC)   Cellulitis  Patient Summary: Stacey Wong is a 40 y.o. with a pertinent PMH of bipolar/behavioral health disorder, anemia, hypertension, chronic pain syndrome, alcohol use disorder, who is admitted for complicated alcohol w/d requiring ICU admission initially and transferred to our service.  She is now on Librium taper and CIWA with Ativan.   Plan: Alcohol withdrawal with delirium  Her CIWA score was 2 consistently overnight.  No Ativan was given.  We will taper Librium to 25 mg daily and continue to monitor her symptoms. -Librium 25 mg daily at bedtime -Continue CIWA protocol with Ativan every 4 hours.  Will probably discontinue this soon -Continue thiamine, folate, multivitamin -Will continue to monitor and replace electrolytes -TOC consult for potential rehab   Hypotonic hyponatremia Sodium improves to 130 with 1 L bolus.  Urine study before and after fluid bolus consistent with hypovolemic hyponatremia secondary to insensible losses. -Will encourage p.o. intake.    Thrombocytosis Platelet did not improve after fluid bolus  yesterday.  Unlikely to be hemoconcentration.  -Will check iron study and ferritin to rule out iron deficiency anemia   Bipolar disorder Psychiatry has been consulted and are making medication recommendations -Continue Seroquel per psychiatry's recommendations   Urinary retention - resolved   Best Practice: Diet: soft IVF: na VTE: xarelto Code: Full Therapy Recs: CIR DISPO: Anticipated discharge to CIR vs home pending Medical stability.  Gaylan Gerold, DO 03/25/2021, 6:30 AM Pager: [phone]  Please contact the on call pager after 5 pm and on weekends at [phone].

## 2021-03-26 ENCOUNTER — Other Ambulatory Visit (HOSPITAL_COMMUNITY): Payer: Self-pay

## 2021-03-26 DIAGNOSIS — F3164 Bipolar disorder, current episode mixed, severe, with psychotic features: Secondary | ICD-10-CM

## 2021-03-26 DIAGNOSIS — F10931 Alcohol use, unspecified with withdrawal delirium: Secondary | ICD-10-CM

## 2021-03-26 LAB — IRON AND TIBC
Iron: 47 ug/dL (ref 28–170)
Saturation Ratios: 32 % — ABNORMAL HIGH (ref 10.4–31.8)
TIBC: 148 ug/dL — ABNORMAL LOW (ref 250–450)
UIBC: 101 ug/dL

## 2021-03-26 LAB — BASIC METABOLIC PANEL
Anion gap: 7 (ref 5–15)
BUN: 6 mg/dL (ref 6–20)
CO2: 23 mmol/L (ref 22–32)
Calcium: 8.8 mg/dL — ABNORMAL LOW (ref 8.9–10.3)
Chloride: 102 mmol/L (ref 98–111)
Creatinine, Ser: 0.56 mg/dL (ref 0.44–1.00)
GFR, Estimated: >60 mL/min (ref >60)
Glucose, Bld: 114 mg/dL — ABNORMAL HIGH (ref 70–99)
Potassium: 4.1 mmol/L (ref 3.5–5.1)
Sodium: 132 mmol/L — ABNORMAL LOW (ref 135–145)

## 2021-03-26 LAB — CBC
HCT: 29.4 % — ABNORMAL LOW (ref 36.0–46.0)
Hemoglobin: 9.9 g/dL — ABNORMAL LOW (ref 12.0–15.0)
MCH: 39.6 pg — ABNORMAL HIGH (ref 26.0–34.0)
MCHC: 33.7 g/dL (ref 30.0–36.0)
MCV: 117.6 fL — ABNORMAL HIGH (ref 80.0–100.0)
Platelets: 745 10*3/uL — ABNORMAL HIGH (ref 150–400)
RBC: 2.5 MIL/uL — ABNORMAL LOW (ref 3.87–5.11)
RDW: 17.5 % — ABNORMAL HIGH (ref 11.5–15.5)
WBC: 5.6 10*3/uL (ref 4.0–10.5)
nRBC: 0 % (ref 0.0–0.2)

## 2021-03-26 LAB — GLUCOSE, CAPILLARY
Glucose-Capillary: 148 mg/dL — ABNORMAL HIGH (ref 70–99)
Glucose-Capillary: 117 mg/dL — ABNORMAL HIGH (ref 70–99)

## 2021-03-26 LAB — FERRITIN: Ferritin: 458 ng/mL — ABNORMAL HIGH (ref 11–307)

## 2021-03-26 MED ORDER — QUETIAPINE FUMARATE 50 MG PO TABS
50.0000 mg | ORAL_TABLET | Freq: Every day | ORAL | 1 refills | Status: AC
  Filled 2021-03-26: qty 30, 30d supply, fill #0

## 2021-03-26 MED ORDER — CHLORDIAZEPOXIDE HCL 25 MG PO CAPS
25.0000 mg | ORAL_CAPSULE | Freq: Every day | ORAL | Status: AC
  Administered 2021-03-26: 16:00:00 25 mg via ORAL
  Filled 2021-03-26: qty 1

## 2021-03-26 MED ORDER — QUETIAPINE FUMARATE 200 MG PO TABS
200.0000 mg | ORAL_TABLET | Freq: Every day | ORAL | 1 refills | Status: AC
  Filled 2021-03-26: qty 30, 30d supply, fill #0

## 2021-03-26 MED ORDER — NICOTINE 21 MG/24HR TD PT24
21.0000 mg | MEDICATED_PATCH | Freq: Every day | TRANSDERMAL | 0 refills | Status: AC
  Filled 2021-03-26: qty 28, 28d supply, fill #0

## 2021-03-26 MED ORDER — FOLIC ACID 1 MG PO TABS
1.0000 mg | ORAL_TABLET | Freq: Every day | ORAL | 1 refills | Status: AC
  Filled 2021-03-26: qty 30, 30d supply, fill #0

## 2021-03-26 MED ORDER — ENSURE ENLIVE PO LIQD
237.0000 mL | Freq: Three times a day (TID) | ORAL | Status: DC
  Administered 2021-03-26: 16:00:00 237 mL via ORAL

## 2021-03-26 MED ORDER — THIAMINE HCL 100 MG PO TABS
100.0000 mg | ORAL_TABLET | Freq: Every day | ORAL | Status: DC
  Administered 2021-03-26: 13:00:00 100 mg via ORAL
  Filled 2021-03-26: qty 1

## 2021-03-26 MED ORDER — CHLORDIAZEPOXIDE HCL 25 MG PO CAPS: 25.0000 mg | ORAL_CAPSULE | Freq: Every day | ORAL | Status: DC

## 2021-03-26 MED ORDER — CERTAVITE/ANTIOXIDANTS PO TABS
1.0000 | ORAL_TABLET | Freq: Every day | ORAL | 1 refills | Status: AC
  Filled 2021-03-26: qty 30, 30d supply, fill #0

## 2021-03-26 MED ORDER — THIAMINE HCL 100 MG PO TABS
100.0000 mg | ORAL_TABLET | Freq: Every day | ORAL | 1 refills | Status: AC
  Filled 2021-03-26: qty 30, 30d supply, fill #0

## 2021-03-26 NOTE — TOC Transition Note (Addendum)
Transition of Care [facility]) - CM/SW Discharge Note   Patient Details  Name: Stacey Wong MRN: [id number] Date of Birth: Jul 21, 1980  [facility]) CM/SW Contact:  Zenon Mayo, RN Phone Number: 03/26/2021, 2:40 PM   Clinical Narrative:   NCM spoke with patient at bedside, she is for dc today, per MD.  Patient only has [employer] and no PCP.  NCM will assist her with her medications with the Match Letter. NCM made referral to [facility] with  [employer] for 3 n 1 and rolling walker for charity.  Patient states she does not need the w/chair, she does not have a credit card to put on file.  NCM notified MD to send meds to [place].  NCM gave patient the [facility] sliding scale application to fill out so that she can get an apt at the [facility] for follow and to be established as a new patient. She states she will take the application home and fill it out.  She states her spouse will transport her home at discharge. Physical thereapy rec HHPT, NCM made referral to [facility] with Alvis Lemmings who is charity this week and he states they do not do charity in [place]. NCM notified the physical therapist to give patient the exercises on paper so she could do them on her own at home, physical therapist states she will.   NCM  gave patient outpatient psych resources.   TOC to fill medications, [employer] to supply the 3 n1 and rolling walker.    Final next level of care: Home/Self Care Barriers to Discharge: No Barriers Identified   Patient Goals and CMS Choice Patient states their goals for this hospitalization and ongoing recovery are:: to return home      Discharge Placement                       Discharge Plan and Services                DME Arranged: 3-N-1, Walker rolling DME Agency: [employer] Date DME Agency Contacted: 03/26/21 Time DME Agency Contacted: [phone] Representative spoke with at DME Agency: [place]: NA          Social  Determinants of Health ([place]) Interventions     Readmission Risk Interventions No flowsheet data found.

## 2021-03-26 NOTE — TOC Progression Note (Addendum)
Transition of Care [facility]) - Progression Note    Patient Details  Name: BRAXTON WEISBECKER MRN: [id number] Date of Birth: [birth year]  Transition of Care [facility]) CM/SW Contact  Zenon Mayo, RN Phone Number: 03/26/2021, 2:10 PM  Clinical Narrative:    NCM spoke with patient at bedside, she is for dc today, per MD.  Patient only has [employer] and no PCP.  NCM will assist her with her medications with the Match Letter. NCM made referral to [facility] with  [employer] for 3 n 1 and rolling walker for charity.  Patient states she does not need the w/chair, she does not have a credit card to put on file.  NCM notified MD to send meds to [place].  NCM gave patient the [facility] sliding scale application to fill out so that she can get an apt at the [facility] for follow and to be established as a new patient. She states she will take the application home and fill it out.  She states her spouse will transport her home at discharge. Physical thereapy rec HHPT, NCM made referral to [facility] with Alvis Lemmings who is charity this week and he states they do not do charity in [place]. NCM notified the physical therapist to give patient the exercises on paper so she could do them on her own at home, physical therapist states she will.           Expected Discharge Plan and Services                                                 Social Determinants of Health (SDOH) Interventions    Readmission Risk Interventions No flowsheet data found.

## 2021-03-26 NOTE — Discharge Instructions (Addendum)
You were admitted to the hospital for severe alcohol withdrawal. You required an ICU stay for this problem. Taking your vitamins, folic acid, and thiamine will be important for your health going forward. You were also diagnosed with bipolar disorder. Taking your prescribed Seroquel will help manage this and prevent recurrence of this problem.   Please follow up with an outpatient psychiatrist for medication management and therapy. You can find potential providers in the list of resources given to you by the social worker and at the website below.  [email or web address]  Corky Sox, MD

## 2021-03-26 NOTE — Progress Notes (Signed)
Nutrition Follow-up  DOCUMENTATION CODES:  Not applicable  INTERVENTION:  Continue diet as tolerated.  Add Ensure Plus High Protein po TID, each supplement provides 350 kcal and 20 grams of protein.   Continue CIWA protocol.  Encourage PO and supplement intake.  NUTRITION DIAGNOSIS:  Inadequate oral intake related to lethargy/confusion as evidenced by meal completion < 25%. - ongoing, improving  GOAL:  Patient will meet greater than or equal to 90% of their needs - progressing  MONITOR:  PO intake, Supplement acceptance, Labs, Weight trends, I & O's  REASON FOR ASSESSMENT:  Consult Enteral/tube feeding initiation and management  ASSESSMENT:  40 yo female admitted with ETOH withdrawal, hypoglycemia. PMH includes HTN, asthma, hip dysplasia, chronic pain, ETOH abuse. 11/20 - CLD 11/21 - Cortrak placed (tip gastric)  11/26 - advanced to soft diet; cleared by psych; removed Cortrak  Spoke with pt at bedside. Pt reports that her appetite is much better and she feels stronger - enough to walk with the walker back and forth across her room three times.  She also reports being very eager to go home. Recommended drinking some Ensure or Boost while at home to keep her strength up. Pt agreeable to this.  Per Epic, pt eating ~49% over the past 8 meals. Pt eating better (70-100%) once diet was advanced to soft.  Admit wt: 54.7 kg Current wt: 52.3 kg  Of note, pt still with BUE edema.  Recommend adding Ensure TID and continuing CIWA protocol.  Previous TF regimen: Jevity 1.2 at 70 ml/h (1680 ml per day)   Provides 2016 kcal, 93 gm protein, 1361 ml free water daily.   Medications: reviewed; colace BID, folic acid, MVI with minerals, miralax, thiamine  Labs: reviewed; Na 132 (L), CBG 101-148 (H)  Diet Order:   Diet Order             DIET SOFT Room service appropriate? Yes; Fluid consistency: Thin  Diet effective now           Diet - low sodium heart healthy                   EDUCATION NEEDS:  Education needs have been addressed  Skin:  Skin Assessment: Skin Integrity Issues: Skin Integrity Issues:: DTI DTI: sacrum  Last BM:  03/25/21  Height:  Ht Readings from Last 1 Encounters:  03/20/21 5\' 4"  (1.626 m)   Weight:  Wt Readings from Last 1 Encounters:  03/26/21 52.3 kg   BMI:  Body mass index is 19.79 kg/m.  Estimated Nutritional Needs:  Kcal:  1900-2100 Protein:  85-100 gm Fluid:  1.9-2.1 L  Derrel Nip, RD, LDN (she/her/hers) Clinical Inpatient Dietitian RD Pager/After-Hours/Weekend Pager # in [place]

## 2021-03-26 NOTE — Progress Notes (Signed)
HD#10 Subjective:  Overnight Events: No event overnight  Patient is seen and assessed this morning. She appears more alert and communicative than previous days. She is tearful throughout the interview. On assessment later the patient is less tearful, stating frequently her preference that she go straight home from the hospital. She is understanding of the risk of going back home and is amenable to HHPT.   Objective:  Vital signs in last 24 hours: Vitals:   03/25/21 0424 03/25/21 1124 03/25/21 2101 03/26/21 0349  BP: (!) 142/96 129/90 102/83 (!) 142/96  Pulse: (!) 109 (!) 110 (!) 113 (!) 106  Resp: 15 16 17 16   Temp: 98.3 F (36.8 C) 98.7 F (37.1 C) 98.6 F (37 C) 98.6 F (37 C)  TempSrc: Oral Oral Oral Oral  SpO2: 96% 100% 100% 98%  Weight:    52.3 kg  Height:       Supplemental O2: Room Air SpO2: 98 % O2 Flow Rate (L/min): 2 L/min   Physical Exam:  Physical Exam Constitutional:      General: She is not in acute distress. HENT:     Head: Normocephalic.  Eyes:     General:        Right eye: No discharge.        Left eye: No discharge.     Conjunctiva/sclera: Conjunctivae normal.  Cardiovascular:     Rate and Rhythm: Normal rate and regular rhythm.  Pulmonary:     Effort: Pulmonary effort is normal. No respiratory distress.  Skin:    General: Skin is warm.     Coloration: Skin is not jaundiced.  Neurological:     General: No focal deficit present.     Mental Status: She is alert.     Cranial Nerves: No cranial nerve deficit.     Comments: 5/5 strength in all extremities, sensation intact +dysdiadochokinesia and dysmetria, normal heel to shin testing Did not assess gait    Filed Weights   03/24/21 0358 03/25/21 0240 03/26/21 0349  Weight: 52.3 kg 49.9 kg 52.3 kg     Intake/Output Summary (Last 24 hours) at 03/26/2021 1054 Last data filed at 03/26/2021 0806 Gross per 24 hour  Intake 1170.64 ml  Output 1340 ml  Net -169.36 ml    Net IO Since  Admission: -6,045.24 mL [03/26/21 1054]  Pertinent Labs: CBC Latest Ref Rng & Units 03/26/2021 03/25/2021 03/24/2021  WBC 4.0 - 10.5 K/uL 5.6 6.0 10.3  Hemoglobin 12.0 - 15.0 g/dL 9.9(L) 9.1(L) 9.4(L)  Hematocrit 36.0 - 46.0 % 29.4(L) 26.2(L) 27.2(L)  Platelets 150 - 400 K/uL 745(H) 600(H) 527(H)    CMP Latest Ref Rng & Units 03/26/2021 03/25/2021 03/24/2021  Glucose 70 - 99 mg/dL 114(H) 101(H) 109(H)  BUN 6 - 20 mg/dL 6 6 7   Creatinine 0.44 - 1.00 mg/dL 0.56 0.42(L) 0.37(L)  Sodium 135 - 145 mmol/L 132(L) 130(L) 126(L)  Potassium 3.5 - 5.1 mmol/L 4.1 4.3 5.0  Chloride 98 - 111 mmol/L 102 100 95(L)  CO2 22 - 32 mmol/L 23 24 24   Calcium 8.9 - 10.3 mg/dL 8.8(L) 8.4(L) 8.6(L)  Total Protein 6.5 - 8.1 g/dL - - -  Total Bilirubin 0.3 - 1.2 mg/dL - - -  Alkaline Phos 38 - 126 U/L - - -  AST 15 - 41 U/L - - -  ALT 0 - 44 U/L - - -    Imaging: No results found.  Assessment/Plan:   Principal Problem:   Alcohol withdrawal (HCC) Active  Problems:   Bipolar disorder (HCC)   Asthma   Panic disorder   Hypokalemia   Hypomagnesemia   Hypocalcemia   Macrocytic anemia   Elevated serum hCG   Hyperammonemia (HCC)   Cellulitis   Patient Summary: Stacey Wong is a 40 y.o. with a pertinent PMH of bipolar/behavioral health disorder, anemia, hypertension, chronic pain syndrome, alcohol use disorder, who is admitted for complicated alcohol w/d requiring ICU admission initially and transferred to our service.  She is now on Librium taper and CIWA with Ativan.   Plan: Alcohol withdrawal with delirium  CIWA of 2 most recently. Patient does not appear to be in significant withdrawal at this time and has shown significant improvement. Based on fall risk, PT has recommended CIR for the patient, which she has adamantly declined.  -Librium 25 mg at bedtime x1 today, then D/C -Continue CIWA protocol with Ativan every 4 hours -Continue thiamine, folate, multivitamin -Will continue to monitor and  replace electrolytes -TOC consult for potential rehab   Hypotonic hyponatremia (resolving) Likely 2/2 to hypovolemia. Sodium continues to improve, 132 this AM.  -Will encourage p.o. intake.    Thrombocytosis Continues to increase to 745 this AM. IDA unlikely with high ferritin of 458 and low TIBC of 148.  -Peripheral smear   Bipolar disorder Psychiatry has been consulted and are making medication recommendations -Continue Seroquel per psychiatry's recommendations   Urinary retention - resolved   Best Practice: Diet: soft IVF: na VTE: xarelto Code: Full Therapy Recs: CIR DISPO: anticipate discharge tomorrow to home, given patient's refusal of acute rehab.   Corky Sox, MD 03/26/2021, 10:54 AM Pager: [phone]  Please contact the on call pager after 5 pm and on weekends at [phone].

## 2021-03-26 NOTE — Progress Notes (Signed)
Physical Therapy Treatment Patient Details Name: Stacey Wong MRN: [id number] DOB: 05-14-1980 Today's Date: 03/26/2021   History of Present Illness Pt is a 40 y.o. female who presented 03/16/21 with acute encephalopathy. Pt had stopped drinking x2 days PTA. Pt admitted for complicated alcohol w/d requiring ICU admission. S/p cortrak placement 11/21. CT of head negative for acute intracranial process. PMH of alcohol use disorder, bipolar disorder, hip dysplasia, anemia, HTN, chronic pain syndrome    PT Comments    Pt was seen for mobility on RW with help, initially being too unsteady for IV pole.  Her switch to RW allows her to be assisted with min assist but initially sat frequently with pt becoming anxious about the work.  After a rest did walk a longer trip, and encouraged her to stay OOB in chair afterward.  Pt is expecting to go to HHPT instead of considering a rehab stay, but feels she can manage her own recovery and therapy can offer strengthening.  Follow along with her for goals of acute PT and work toward her safe independence of mobility, to continue to encourage EtOH rehab and to increase time OOB to orient her and build endurance.  Husband was in attendance for entire session.  Recommendations for follow up therapy are one component of a multi-disciplinary discharge planning process, led by the attending physician.  Recommendations may be updated based on patient status, additional functional criteria and insurance authorization.  Follow Up Recommendations  Home health PT     Assistance Recommended at Discharge Frequent or constant Supervision/Assistance  Equipment Recommendations  Rolling walker (2 wheels);BSC/3in1;Wheelchair (measurements PT);Wheelchair cushion (measurements PT)    Recommendations for Other Services       Precautions / Restrictions Precautions Precautions: Fall Restrictions Weight Bearing Restrictions: No     Mobility  Bed Mobility Overal bed  mobility: Needs Assistance             General bed mobility comments: up to side of bed when therapy arrived, wanted to return after walking but convinced her to stay in chair awhile    Transfers Overall transfer level: Needs assistance Equipment used: Rolling walker (2 wheels);1 person hand held assist Transfers: Sit to/from Stand Sit to Stand: Min assist           General transfer comment: required cue to place hands 100% of the trials    Ambulation/Gait Ambulation/Gait assistance: Min assist;+2 physical assistance;+2 safety/equipment (second person for IV pole as pt is unreliable for first walk, tending to get close to obstacles) Gait Distance (Feet): 70 Feet (12 x 2, 35, 11) Assistive device: Rolling walker (2 wheels);1 person hand held assist Gait Pattern/deviations: Step-through pattern;Decreased stride length;Trunk flexed;Knees buckling Gait velocity: reduced   Pre-gait activities: wgt shift, sidesteps, discussion of AD General Gait Details: pt was up to walker after initially using IV pole but too unstable with crossteps and buckling.  RW is min assist and close guard, with dense cues for lines and safety, tries to abandon the walker before being set up to sit every trial.   Stairs             Wheelchair Mobility    Modified Rankin (Stroke Patients Only)       Balance Overall balance assessment: Needs assistance Sitting-balance support: Feet supported Sitting balance-Leahy Scale: Good     Standing balance support: Bilateral upper extremity supported Standing balance-Leahy Scale: Poor  Cognition Arousal/Alertness: Awake/alert Behavior During Therapy: Anxious;Flat affect Overall Cognitive Status: Impaired/Different from baseline Area of Impairment: Problem solving;Awareness;Safety/judgement;Following commands;Attention                 Orientation Level: Time Current Attention Level:  Selective Memory: Decreased recall of precautions;Decreased short-term memory Following Commands: Follows one step commands inconsistently;Follows one step commands with increased time Safety/Judgement: Decreased awareness of safety;Decreased awareness of deficits Awareness: Intellectual Problem Solving: Slow processing;Requires verbal cues;Requires tactile cues;Difficulty sequencing General Comments: pt was assisted to BR and chair, two longer walks and then discussing avoiding rehab for her EtOH use. Not committing to her use of RW and states she can just hold the furniture        Exercises General Exercises - Lower Extremity Ankle Circles/Pumps: AROM;AAROM;5 reps Long Arc Quad: Strengthening;10 reps Heel Slides: Strengthening;10 reps    General Comments General comments (skin integrity, edema, etc.): pt was assisted to BR and then to walk in the room      Pertinent Vitals/Pain Pain Assessment: No/denies pain    Home Living                          Prior Function            PT Goals (current goals can now be found in the care plan section) Acute Rehab PT Goals Patient Stated Goal: go home soon Progress towards PT goals: Progressing toward goals    Frequency    Min 3X/week      PT Plan Discharge plan needs to be updated    Co-evaluation PT/OT/SLP Co-Evaluation/Treatment: Yes Reason for Co-Treatment: To address functional/ADL transfers;For patient/therapist safety PT goals addressed during session: Mobility/safety with mobility;Balance;Proper use of DME        AM-PAC PT "6 Clicks" Mobility   Outcome Measure  Help needed turning from your back to your side while in a flat bed without using bedrails?: A Little Help needed moving from lying on your back to sitting on the side of a flat bed without using bedrails?: A Little Help needed moving to and from a bed to a chair (including a wheelchair)?: A Little Help needed standing up from a chair using  your arms (e.g., wheelchair or bedside chair)?: A Little Help needed to walk in hospital room?: A Little Help needed climbing 3-5 steps with a railing? : Total 6 Click Score: 16    End of Session Equipment Utilized During Treatment: Gait belt Activity Tolerance: Patient tolerated treatment well Patient left: in chair;with call bell/phone within reach;with chair alarm set;with family/visitor present Nurse Communication: Mobility status PT Visit Diagnosis: Unsteadiness on feet (R26.81);Other abnormalities of gait and mobility (R26.89);Muscle weakness (generalized) (M62.81);Difficulty in walking, not elsewhere classified (R26.2);Ataxic gait (R26.0)     Time: 4944-9675 PT Time Calculation (min) (ACUTE ONLY): 31 min  Charges:  $Gait Training: 8-22 mins                    Ramond Dial 03/26/2021, 11:51 AM  Mee Hives, PT PhD Acute Rehab Dept. Number: [place] [facility] [place]

## 2021-03-26 NOTE — Plan of Care (Signed)

## 2021-03-26 NOTE — Progress Notes (Addendum)
Pt anxious with mobility, but able to complete with min assist, safer with RW. Pt able to don socks with set up, toilet with min assist and wash hands at sink with min guard assist. HR to 135 with activity. Pt will have 24 hour care of her husband when she returns home. Updated d/c recommendation, progressing well.   03/26/21 1000  OT Visit Information  Last OT Received On 03/26/21  Assistance Needed +1  History of Present Illness Pt is a 40 y.o. female who presented 03/16/21 with acute encephalopathy. Pt had stopped drinking x2 days PTA. Pt admitted for complicated alcohol w/d requiring ICU admission. S/p cortrak placement 11/21. CT of head negative for acute intracranial process. PMH of alcohol use disorder, bipolar disorder, hip dysplasia, anemia, HTN, chronic pain syndrome  Precautions  Precautions Fall  Pain Assessment  Pain Assessment No/denies pain  Cognition  Arousal/Alertness Awake/alert  Behavior During Therapy Anxious  Overall Cognitive Status Impaired/Different from baseline  Area of Impairment Problem solving;Orientation;Memory  Orientation Level Disoriented to;Time  Current Attention Level Selective  Memory Decreased short-term memory  Following Commands Follows one step commands with increased time  Awareness Intellectual  Problem Solving Slow processing;Decreased initiation  General Comments pt stating she will go cold [place], no interest in alcohol rehab, husband supports this decision  ADL  Overall ADL's  Needs assistance/impaired  Grooming Wash/dry hands;Standing;Min guard  Grooming Details (indicate cue type and reason) places elbows on sink  Lower Body Dressing Set up;Sitting/lateral leans  Toilet Transfer Minimal assistance;Ambulation;Rolling walker (2 wheels);Grab bars  Toileting- Water quality scientist and Hygiene Min guard;Sit to/from stand  Functional mobility during ADLs Minimal assistance;Rolling walker (2 wheels)  Bed Mobility  General bed mobility comments  sitting EOB upon arrival  Transfers  Overall transfer level Needs assistance  Equipment used Rolling walker (2 wheels);1 person hand held assist  Transfers Sit to/from Stand  Sit to Stand Min assist  General transfer comment cues for hand placement, assist to rise and steady  Balance  Overall balance assessment Needs assistance  Sitting balance-Leahy Scale Good  Standing balance support Bilateral upper extremity supported  Standing balance-Leahy Scale Poor  OT - End of Session  Equipment Utilized During Treatment Rolling walker (2 wheels);Gait belt  Activity Tolerance Patient tolerated treatment well  Patient left in chair;with call bell/phone within reach;with chair alarm set  OT Assessment/Plan  OT Plan Discharge plan needs to be updated  OT Visit Diagnosis Unsteadiness on feet (R26.81);Muscle weakness (generalized) (M62.81)  OT Frequency (ACUTE ONLY) Min 3X/week  Follow Up Recommendations No OT follow up  Assistance recommended at discharge Frequent or constant Supervision/Assistance  OT Equipment None recommended by OT  AM-PAC OT "6 Clicks" Daily Activity Outcome Measure (Version 2)  Help from another person eating meals? 4  Help from another person taking care of personal grooming? 3  Help from another person toileting, which includes using toliet, bedpan, or urinal? 3  Help from another person bathing (including washing, rinsing, drying)? 3  Help from another person to put on and taking off regular upper body clothing? 4  Help from another person to put on and taking off regular lower body clothing? 3  6 Click Score 20  Progressive Mobility  What is the highest level of mobility based on the progressive mobility assessment? Level 4 (Walks with assist in room) - Balance while marching in place and cannot step forward and back - Complete  Mobility Ambulated with assistance in room  OT Goal Progression  Progress towards  OT goals Progressing toward goals  Acute Rehab OT Goals   OT Goal Formulation With patient  Time For Goal Achievement 04/08/21  Potential to Achieve Goals Good  OT Time Calculation  OT Start Time (ACUTE ONLY) 1021  OT Stop Time (ACUTE ONLY) 1052  OT Time Calculation (min) 31 min  OT General Charges  $OT Visit 1 Visit  OT Treatments  $Self Care/Home Management  8-22 mins

## 2021-03-26 NOTE — Progress Notes (Deleted)
Name: Stacey Wong MRN: [id number] DOB: March 16, 1981 40 y.o. PCP: Patient, No Pcp Per (Inactive)  Date of Admission: 03/16/2021 12:57 PM Date of Discharge: No discharge date for patient encounter. Attending Physician: Velna Ochs, MD  Discharge Diagnosis: 1. Complicated alcohol withdrawal with Delirium 2. Bipolar affective disorder 3. Thrombocytosis 4. Hyponatremia  Discharge Medications: Allergies as of 03/26/2021   No Known Allergies      Medication List     STOP taking these medications    potassium chloride SA 20 MEQ tablet Commonly known as: KLOR-CON   Voltaren 1 % Gel Generic drug: diclofenac Sodium       TAKE these medications    CertaVite/Antioxidants Tabs Take 1 tablet by mouth daily. Start taking on: March 27, 8545   folic acid 1 MG tablet Commonly known as: FOLVITE Take 1 tablet (1 mg total) by mouth daily. Start taking on: March 27, 2021   nicotine 21 mg/24hr patch Commonly known as: NICODERM CQ - dosed in mg/24 hours Place 1 patch (21 mg total) onto the skin daily. Start taking on: March 27, 2021   QUEtiapine 200 MG tablet Commonly known as: SEROQUEL Place 1 tablet (200 mg total) into feeding tube at bedtime.   QUEtiapine 50 MG tablet Commonly known as: SEROQUEL Take 1 tablet (50 mg total) by mouth daily. Start taking on: March 27, 2021   thiamine 100 MG tablet Take 1 tablet (100 mg total) by mouth daily. Start taking on: March 27, 2021               Durable Medical Equipment  (From admission, onward)           Start     Ordered   03/26/21 1407  For home use only DME Bedside commode  Once       Question:  Patient needs a bedside commode to treat with the following condition  Answer:  Weakness   03/26/21 1406   03/26/21 1405  For home use only DME Walker rolling  Once       Question Answer Comment  Walker: With [place]   Patient needs a walker to treat with the following condition Weakness       03/26/21 1404              Discharge Care Instructions  (From admission, onward)           Start     Ordered   03/26/21 0000  Discharge wound care:       Comments: As per nursing instructions   03/26/21 1421            Disposition and follow-up:   Ms.Ajna R Milosevic was discharged from [facility] in Stable condition.  At the hospital follow up visit please address:  1.  Counsel on abstinence from alcohol. Continue to monitor for development of cirrhosis.   2.  Labs / imaging needed at time of follow-up: NA  3.  Pending labs/ test needing follow-up: peripheral blood smear for thrombocytosis.   Follow-up Appointments:  Follow-up Information     [facility] Follow up.   Contact information: [address] [place], [facility] Family Follow up.   Specialty: Family Medicine Why: patient to fill out sliding scale application before apt can be made Contact information: [place] [address] [phone]  [facility] Follow up.   Why: 3 n 1, rolling walker Contact information: [place] [address] [phone]                 Hospital Course by problem list: 1. Complicated alcohol withdrawal with Delirium Patient presented with confusion, agitation, and hallucinations in the context of quitting alcohol 2 days prior. She received large amounts of Ativan in the ED without improvement. She required an ICU admission with Precedex drip for several days. She was then started on a phenobarbital taper. She was then sent to the floor for observation. She was transitioned to Librium, which was tapered to once daily dosing before D/C. On the day of discharge the patient was alert and oriented, able to engage in interview. PT/OT deemed her appropriate for returning home with home health PT. Patient was  discharged with resources for substance use treatment.   2. Bipolar affective disorder Psychiatry was consulted and the patient was started on Seroquel 50 mg daily and 200 mg QHS. Reportedly the patient had been non-adherent with medicines for 4 years. The patient was discharged with information for outpatient psychiatric follow up.   3. Thrombocytosis Platelets of 745 on day of discharge. This developed during the course of the hospitalization. Uncertain etiology. Labs negative for iron deficiency. Peripheral smear pending at time of discharge.   4. Hyponatremia Thought to be secondary to hypovolemia. Improved with normal saline challenge.    Discharge Exam:   BP 137/84 (BP Location: Left Arm)   Pulse (!) 102   Temp 98.6 F (37 C) (Oral)   Resp 17   Ht 5\' 4"  (1.626 m)   Wt 52.3 kg   SpO2 100%   BMI 19.79 kg/m  Discharge exam:  Physical Exam Vitals reviewed.  Cardiovascular:     Rate and Rhythm: Normal rate and regular rhythm.     Pulses: Normal pulses.     Heart sounds: No murmur heard. Pulmonary:     Effort: Pulmonary effort is normal.     Breath sounds: Normal breath sounds.  Abdominal:     General: Bowel sounds are normal.     Palpations: Abdomen is soft.     Tenderness: There is no abdominal tenderness.  Neurological:     Mental Status: She is alert.     Cranial Nerves: No cranial nerve deficit.     Comments: 5/5 strength in all extremities, sensation intact +dysdiadochokinesia and dysmetria, normal heel to shin testing Did not assess gait      Pertinent Labs, Studies, and Procedures:  Plts as above  Discharge Instructions: You were admitted to the hospital for severe alcohol withdrawal. You required an ICU stay for this problem. Taking your vitamins, folic acid, and thiamine will be important for your health going forward. You were also diagnosed with bipolar disorder. Taking your prescribed Seroquel will help manage this and prevent recurrence of this problem.    Please follow up with an outpatient psychiatrist for medication management and therapy. You can find potential providers in the list of resources given to you by the social worker and at the website below.  [email or web address]   Signed: Corky Sox, MD PGY-1 Pager: [phone] After 5pm on weekdays and 1pm on weekends: On Call pager: [phone]

## 2021-03-26 NOTE — Plan of Care (Signed)
  Problem: Education: Goal: Knowledge of General Education information will improve Description: Including pain rating scale, medication(s)/side effects and non-pharmacologic comfort measures 03/26/2021 1505 by Drucie Ip I, RN Outcome: Adequate for Discharge 03/26/2021 1505 by Drucie Ip I, RN Outcome: Adequate for Discharge   Problem: Health Behavior/Discharge Planning: Goal: Ability to manage health-related needs will improve 03/26/2021 1505 by Drucie Ip I, RN Outcome: Adequate for Discharge 03/26/2021 1505 by Drucie Ip I, RN Outcome: Adequate for Discharge   Problem: Clinical Measurements: Goal: Ability to maintain clinical measurements within normal limits will improve 03/26/2021 1505 by Drucie Ip I, RN Outcome: Adequate for Discharge 03/26/2021 1505 by Drucie Ip I, RN Outcome: Adequate for Discharge Goal: Will remain free from infection 03/26/2021 1505 by Drucie Ip I, RN Outcome: Adequate for Discharge 03/26/2021 1505 by Drucie Ip I, RN Outcome: Adequate for Discharge Goal: Diagnostic test results will improve 03/26/2021 1505 by Drucie Ip I, RN Outcome: Adequate for Discharge 03/26/2021 1505 by Drucie Ip I, RN Outcome: Adequate for Discharge Goal: Respiratory complications will improve 03/26/2021 1505 by Drucie Ip I, RN Outcome: Adequate for Discharge 03/26/2021 1505 by Drucie Ip I, RN Outcome: Adequate for Discharge Goal: Cardiovascular complication will be avoided 03/26/2021 1505 by Drucie Ip I, RN Outcome: Adequate for Discharge 03/26/2021 1505 by Drucie Ip I, RN Outcome: Adequate for Discharge   Problem: Activity: Goal: Risk for activity intolerance will decrease 03/26/2021 1505 by Drucie Ip I, RN Outcome: Adequate for Discharge 03/26/2021 1505 by Drucie Ip I, RN Outcome: Adequate for Discharge   Problem: Nutrition: Goal: Adequate  nutrition will be maintained 03/26/2021 1505 by Drucie Ip I, RN Outcome: Adequate for Discharge 03/26/2021 1505 by Drucie Ip I, RN Outcome: Adequate for Discharge   Problem: Coping: Goal: Level of anxiety will decrease 03/26/2021 1505 by Drucie Ip I, RN Outcome: Adequate for Discharge 03/26/2021 1505 by Drucie Ip I, RN Outcome: Adequate for Discharge   Problem: Elimination: Goal: Will not experience complications related to bowel motility 03/26/2021 1505 by Drucie Ip I, RN Outcome: Adequate for Discharge 03/26/2021 1505 by Drucie Ip I, RN Outcome: Adequate for Discharge Goal: Will not experience complications related to urinary retention 03/26/2021 1505 by Drucie Ip I, RN Outcome: Adequate for Discharge 03/26/2021 1505 by Drucie Ip I, RN Outcome: Adequate for Discharge   Problem: Pain Managment: Goal: General experience of comfort will improve 03/26/2021 1505 by Drucie Ip I, RN Outcome: Adequate for Discharge 03/26/2021 1505 by Drucie Ip I, RN Outcome: Adequate for Discharge   Problem: Safety: Goal: Ability to remain free from injury will improve 03/26/2021 1505 by Drucie Ip I, RN Outcome: Adequate for Discharge 03/26/2021 1505 by Drucie Ip I, RN Outcome: Adequate for Discharge   Problem: Skin Integrity: Goal: Risk for impaired skin integrity will decrease 03/26/2021 1505 by Drucie Ip I, RN Outcome: Adequate for Discharge 03/26/2021 1505 by Drucie Ip I, RN Outcome: Adequate for Discharge

## 2021-03-26 NOTE — Consult Note (Signed)
[facility] Face-to-Face Psychiatry Consult   Reason for Consult:  Alcohol withdrawal agitation, Hx of Bipolar disorder, currently untreated Referring Physician:  Marshell Garfinkel, MD Patient Identification: Stacey Wong MRN:  [id number] Principal Diagnosis: Alcohol withdrawal ([place]) Diagnosis:  Principal Problem:   Alcohol withdrawal ([place]) Active Problems:   Bipolar disorder ([place])   Asthma   Panic disorder   Hypokalemia   Hypomagnesemia   Hypocalcemia   Macrocytic anemia   Elevated serum hCG   Hyperammonemia ([place])   Cellulitis  Assessment  Stacey Wong is a 40 y.o. 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   She meets criteria for EtOH use disorder based on presentation and hx per EMR and family collateral.  Patient's assessment was hindered by her present delirium; however she does endorse a possible recent manic episode and her husband was able to corroborate behaviors concerning for Bipolar disorder. Outpatient psychotropic medications included lamictal in the past and historically he has had a good response to these medications. She was not compliant with medications prior to admission as evidenced by patient and patinet's husband endorsing that patient has been unmedicated for years . On initial examination, patient appears delirious but attempts to do her best to participate on assessment. We plan to recommend psychotropic treatment regimens for likely Bipolar disorder and will also assist with psychotic symptoms during [facility] withdrawal .    Patient appears significantly improved today.  Although patient was tearful this appeared to be intermittent and patient was able to participate in assessment.  Patient appeared to  wish to want to go home but had been making plans with her husband about the changes that would be forthcoming in order to decrease her chances of relapse for EtOH use and continued mental health care.  Patient endorsed some confusion about the past few days of her hospitalization however patient was able to make sense of what had happened when provider went over patient's hospitalization again with her briefly.  Patient appeared to be significantly less irritable and her husband endorse feeling that medication has been very beneficial for patient and that he would continue to make sure patient received this outpatient.   EtoH withdrawal, complicated EtOH use disorder Patient was declared medically stable and wishing to go home.  Patient's husband endorse that he felt safe taking patient home.  Unfortunately psychiatry consult liaison service was not able to talk to patient about naltrexone prior to this decision being made.  However conversation was had with patient about considering rehab and that [facility] would also likely have resources available should she be interested in this. - Recommend SW consult for rehab or detox options   Hx Bipolar disorder - Continue Seroquel 50mg  daily 200mg  QHS - Recommend SW consult for OP psychiatry options (likely [facility] in [facility])   Tobacco use disorder - Continue Nicotine patch   Safety - At this time patient appears to be of low risk of danger to herself or others. Recommend routine observation.   Dispo - Per primary    Total Time spent with patient: 20 minutes  Subjective:   Stacey Wong is a 40 y.o. female patient admitted with EtoH withdrawal.  HPI: Patient assessed this AM.  Patient has been in the room this a.m.  Patient endorses that  she has no intentions of drinking again and endorses understanding that her EtOH consumption contributed significantly to her hospitalization.  Patient endorses that she also believes that her recent  illness made her a bit more malnourished.  Patient and provider discussed that if patient should relapse in the future and wish to discontinue alcohol it would be highly recommended that she come into the hospital for detox.  Patient and husband endorsed understanding of this.  Patient endorsed that she would like to continue her Seroquel and would like to follow up with a psychiatrist outpatient.  Patient denied SI, HI and AVH on assessment today.  Patient endorsed that she had been having hallucinations during her withdrawal but has not had any in at least the past 24 hours.    Past Medical History:  Past Medical History:  Diagnosis Date   Asthma    Bipolar 1 disorder ([place])    Hypertension     Past Surgical History:  Procedure Laterality Date   CHOLECYSTECTOMY     Family History: History reviewed. No pertinent family history.  Social History:  Social History   Substance and Sexual Activity  Alcohol Use Yes   Alcohol/week: 0.0 standard drinks   Comment: occasionally     Social History   Substance and Sexual Activity  Drug Use No    Social History   Socioeconomic History   Marital status: Divorced    Spouse name: Not on file   Number of children: Not on file   Years of education: Not on file   Highest education level: Not on file  Occupational History   Not on file  Tobacco Use   Smoking status: Every Day    Packs/day: 1.00    Types: Cigarettes   Smokeless tobacco: Never   Tobacco comments:    decreased smoking  Substance and Sexual Activity   Alcohol use: Yes    Alcohol/week: 0.0 standard drinks    Comment: occasionally   Drug use: No   Sexual activity: Not on file  Other Topics Concern   Not on file  Social History Narrative   Not on file   Social Determinants of Health   Financial Resource Strain: Not on file  Food Insecurity: Not on file  Transportation Needs: Not on file  Physical Activity: Not on file  Stress: Not on file  Social Connections: Not  on file   Additional Social History:    Allergies:  No Known Allergies  Labs:  Results for orders placed or performed during the hospital encounter of 03/16/21 (from the past 48 hour(s))  Sodium, urine, random     Status: None   Collection Time: 03/24/21  3:08 PM  Result Value Ref Range   Sodium, Ur 30 mmol/L    Comment: Performed at [facility], [address], [place], [place] [address]  Osmolality, urine     Status: Abnormal   Collection Time: 03/24/21  3:08 PM  Result Value Ref Range   Osmolality, Ur 159 (L) 300 - 900 mOsm/kg    Comment: Performed at [facility] [address], [place], [place] [address]  Glucose, capillary     Status: Abnormal   Collection Time: 03/24/21  8:18 PM  Result Value Ref Range   Glucose-Capillary 102 (H) 70 - 99 mg/dL    Comment: Glucose reference range applies only to samples taken after fasting for at least 8 hours.   Comment 1 Notify RN    Comment 2 Document in  Chart   CBC     Status: Abnormal   Collection Time: 03/25/21  2:58 AM  Result Value Ref Range   WBC 6.0 4.0 - 10.5 K/uL   RBC 2.24 (L) 3.87 - 5.11 MIL/uL   Hemoglobin 9.1 (L) 12.0 - 15.0 g/dL   HCT 26.2 (L) 36.0 - 46.0 %   MCV 117.0 (H) 80.0 - 100.0 fL   MCH 40.6 (H) 26.0 - 34.0 pg   MCHC 34.7 30.0 - 36.0 g/dL   RDW 17.6 (H) 11.5 - 15.5 %   Platelets 600 (H) 150 - 400 K/uL   nRBC 0.0 0.0 - 0.2 %    Comment: Performed at [facility] [address] [place], [place] [address]  Basic metabolic panel     Status: Abnormal   Collection Time: 03/25/21  2:58 AM  Result Value Ref Range   Sodium 130 (L) 135 - 145 mmol/L   Potassium 4.3 3.5 - 5.1 mmol/L   Chloride 100 98 - 111 mmol/L   CO2 24 22 - 32 mmol/L   Glucose, Bld 101 (H) 70 - 99 mg/dL    Comment: Glucose reference range applies only to samples taken after fasting for at least 8 hours.   BUN 6 6 - 20 mg/dL   Creatinine, Ser 0.42 (L) 0.44 - 1.00 mg/dL   Calcium 8.4 (L) 8.9 - 10.3 mg/dL   GFR, Estimated  >60 >60 mL/min    Comment: (NOTE) Calculated using the CKD-EPI Creatinine Equation (2021)    Anion gap 6 5 - 15    Comment: Performed at [facility] [address], [place], [place] [address]  Magnesium     Status: None   Collection Time: 03/25/21  2:58 AM  Result Value Ref Range   Magnesium 1.8 1.7 - 2.4 mg/dL    Comment: Performed at [facility] [address], [place], [place] [address]  Phosphorus     Status: Abnormal   Collection Time: 03/25/21  2:58 AM  Result Value Ref Range   Phosphorus 4.9 (H) 2.5 - 4.6 mg/dL    Comment: Performed at [facility] [address], [place], [place] [address]  Glucose, capillary     Status: None   Collection Time: 03/25/21  8:12 AM  Result Value Ref Range   Glucose-Capillary 96 70 - 99 mg/dL    Comment: Glucose reference range applies only to samples taken after fasting for at least 8 hours.  Glucose, capillary     Status: Abnormal   Collection Time: 03/25/21 11:26 AM  Result Value Ref Range   Glucose-Capillary 101 (H) 70 - 99 mg/dL    Comment: Glucose reference range applies only to samples taken after fasting for at least 8 hours.  Glucose, capillary     Status: Abnormal   Collection Time: 03/25/21  4:07 PM  Result Value Ref Range   Glucose-Capillary 130 (H) 70 - 99 mg/dL    Comment: Glucose reference range applies only to samples taken after fasting for at least 8 hours.  Glucose, capillary     Status: Abnormal   Collection Time: 03/25/21  9:32 PM  Result Value Ref Range   Glucose-Capillary 101 (H) 70 - 99 mg/dL    Comment: Glucose reference range applies only to samples taken after fasting for at least 8 hours.   Comment 1 Notify RN    Comment 2 Document in Chart   Basic metabolic panel     Status:  Abnormal   Collection Time: 03/26/21  3:37 AM  Result Value Ref Range   Sodium 132 (L) 135 - 145 mmol/L   Potassium 4.1 3.5 - 5.1 mmol/L   Chloride 102 98 - 111 mmol/L   CO2 23 22 - 32 mmol/L   Glucose, Bld 114  (H) 70 - 99 mg/dL    Comment: Glucose reference range applies only to samples taken after fasting for at least 8 hours.   BUN 6 6 - 20 mg/dL   Creatinine, Ser 0.56 0.44 - 1.00 mg/dL   Calcium 8.8 (L) 8.9 - 10.3 mg/dL   GFR, Estimated >60 >60 mL/min    Comment: (NOTE) Calculated using the CKD-EPI Creatinine Equation (2021)    Anion gap 7 5 - 15    Comment: Performed at [facility] [address], [place], [place] [address]  CBC     Status: Abnormal   Collection Time: 03/26/21  3:37 AM  Result Value Ref Range   WBC 5.6 4.0 - 10.5 K/uL   RBC 2.50 (L) 3.87 - 5.11 MIL/uL   Hemoglobin 9.9 (L) 12.0 - 15.0 g/dL   HCT 29.4 (L) 36.0 - 46.0 %   MCV 117.6 (H) 80.0 - 100.0 fL   MCH 39.6 (H) 26.0 - 34.0 pg   MCHC 33.7 30.0 - 36.0 g/dL   RDW 17.5 (H) 11.5 - 15.5 %   Platelets 745 (H) 150 - 400 K/uL   nRBC 0.0 0.0 - 0.2 %    Comment: Performed at [facility], [place] [address] [place], [place] [address]  Ferritin     Status: Abnormal   Collection Time: 03/26/21  3:37 AM  Result Value Ref Range   Ferritin 458 (H) 11 - 307 ng/mL    Comment: Performed at [facility], [place] [address] [place], [place] [address]  Iron and TIBC     Status: Abnormal   Collection Time: 03/26/21  3:37 AM  Result Value Ref Range   Iron 47 28 - 170 ug/dL   TIBC 148 (L) 250 - 450 ug/dL   Saturation Ratios 32 (H) 10.4 - 31.8 %   UIBC 101 ug/dL    Comment: Performed at [facility], [place] [address] [place], [place] [address]  Glucose, capillary     Status: Abnormal   Collection Time: 03/26/21  6:16 AM  Result Value Ref Range   Glucose-Capillary 148 (H) 70 - 99 mg/dL    Comment: Glucose reference range applies only to samples taken after fasting for at least 8 hours.   Comment 1 Notify RN    Comment 2 Document in Chart   Glucose, capillary     Status: Abnormal   Collection Time: 03/26/21 11:09 AM  Result Value Ref Range   Glucose-Capillary 117 (H) 70 - 99 mg/dL    Comment: Glucose reference  range applies only to samples taken after fasting for at least 8 hours.    Current Facility-Administered Medications  Medication Dose Route Frequency Provider Last Rate Last Admin   acetaminophen (TYLENOL) tablet 500 mg  500 mg Oral Q6H PRN Corky Sox, MD   500 mg at 03/26/21 1258   chlordiazePOXIDE (LIBRIUM) capsule 25 mg  25 mg Oral QHS Corky Sox, MD       chlorhexidine (PERIDEX) 0.12 % solution 15 mL  15 mL Mouth Rinse BID Aslam, Sadia, MD   15 mL at 03/26/21 0930   Chlorhexidine Gluconate Cloth 2 % PADS 6 each  6 each Topical Q0600 Renee Pain, MD   6 each at 03/24/21 [phone]   diclofenac Sodium (VOLTAREN) 1 % topical gel 2 g  2 g Topical BID PRN Gaylan Gerold, DO   2 g at 03/25/21 1300   docusate sodium (COLACE) capsule 100 mg  100 mg Oral BID Marianna Payment, MD   100 mg at 03/25/21 2011   feeding supplement (ENSURE ENLIVE / ENSURE PLUS) liquid 237 mL  237 mL Oral TID BM Velna Ochs, MD       folic acid (FOLVITE) tablet 1 mg  1 mg Oral Daily Gaylan Gerold, DO   1 mg at 03/26/21 7510   lip balm (CARMEX) ointment   Topical PRN Spero Geralds, MD       MEDLINE mouth rinse  15 mL Mouth Rinse q12n4p Aslam, Sadia, MD   15 mL at 03/26/21 1256   multivitamin with minerals tablet 1 tablet  1 tablet Oral Daily Gaylan Gerold, DO   1 tablet at 03/26/21 2585   nicotine (NICODERM CQ - dosed in mg/24 hours) patch 21 mg  21 mg Transdermal Daily Corky Sox, MD   21 mg at 03/26/21 0931   polyethylene glycol (MIRALAX / GLYCOLAX) packet 17 g  17 g Oral Daily Marianna Payment, MD   17 g at 03/24/21 0835   QUEtiapine (SEROQUEL) tablet 200 mg  200 mg Per Tube QHS Mannam, Praveen, MD   200 mg at 03/25/21 2010   QUEtiapine (SEROQUEL) tablet 50 mg  50 mg Oral Daily Corky Sox, MD   50 mg at 03/26/21 1255   rivaroxaban (XARELTO) tablet 10 mg  10 mg Oral Daily Gaylan Gerold, DO   10 mg at 03/26/21 0930   tamsulosin (FLOMAX) capsule 0.4 mg  0.4 mg Oral Daily Aslam, Loralyn [place], MD   0.4 mg at 03/26/21  2778   thiamine tablet 100 mg  100 mg Oral Daily Gaylan Gerold, DO   100 mg at 03/26/21 1255      Psychiatric Specialty Exam:  Presentation  General Appearance: Appropriate for Environment  Eye Contact:Fair  Speech:Clear and Coherent  Speech Volume:Normal  Handedness:No data recorded  Mood and Affect  Mood:Dysphoric; Anxious  Affect:Tearful   Thought Process  Thought Processes:Coherent  Descriptions of Associations:Intact  Orientation:Full (Time, Place and Person)  Thought Content:Logical  History of Schizophrenia/Schizoaffective disorder:No data recorded Duration of Psychotic Symptoms:No data recorded Hallucinations:Hallucinations: None  Ideas of Reference:None  Suicidal Thoughts:Suicidal Thoughts: No  Homicidal Thoughts:Homicidal Thoughts: No   Sensorium  Memory:Immediate Fair; Recent Poor; Remote Fair  Judgment:-- (Improving)  Insight:Shallow   Executive Functions  Concentration:Fair  Attention Span:Fair  Recall:No data recorded Fund of Knowledge:Fair  Language:Good   Psychomotor Activity  Psychomotor Activity:Psychomotor Activity: Normal   Assets  Assets:Communication Skills; Housing; Social Support   Sleep  Sleep:Sleep: Good   Physical Exam: Physical Exam HENT:     Head: Normocephalic and atraumatic.  Pulmonary:     Effort: Pulmonary effort is normal.  Skin:    General: Skin is dry.  Neurological:     Mental Status: She is alert and oriented to person, place, and time.   Review of Systems  Psychiatric/Behavioral:  Negative for hallucinations and suicidal ideas.   Blood pressure 137/84, pulse (!) 102, temperature 98.6 F (37 C), temperature source Oral, resp. rate 17, height 5\' 4"  (1.626 m), weight 52.3 kg, SpO2 100 %. Body mass index is 19.79 kg/m.  PGY-2 Freida Busman, MD 03/26/2021 2:40 PM

## 2021-03-26 NOTE — Progress Notes (Signed)
Physical Therapy Treatment Patient Details Name: Stacey Wong MRN: [id number] DOB: [birth year] Today's Date: 03/26/2021   History of Present Illness Pt is a 40 y.o. female who presented 03/16/21 with acute encephalopathy. Pt had stopped drinking x2 days PTA. Pt admitted for complicated alcohol w/d requiring ICU admission. S/p cortrak placement 11/21. CT of head negative for acute intracranial process. PMH of alcohol use disorder, bipolar disorder, hip dysplasia, anemia, HTN, chronic pain syndrome    PT Comments    Due to pt having limited resources for therapy, have instructed her further in exercises since the AM session, with a written handout given to her. Pt is still sitting on side of bed and awaiting discharge, and talked with her about the need to get her husband to supervise gait.  Pt may not have access to a wheelchair for home, and will need to rely on his support to walk safely given her ataxia and lack of formal PT.  Shared with the case manager that the pt has potential use of free therapy if she can go to the PT programs at local colleges, but transportation may stop her from taking advantage of this.   Follow acutely for goals as outlined in the PT plan of care.   Recommendations for follow up therapy are one component of a multi-disciplinary discharge planning process, led by the attending physician.  Recommendations may be updated based on patient status, additional functional criteria and insurance authorization.  Follow Up Recommendations  Home health PT (also has option to use [facility] at [facility] or [facility])     Assistance Recommended at Discharge Frequent or constant Supervision/Assistance  Equipment Recommendations  Rolling walker (2 wheels);BSC/3in1;Wheelchair (measurements PT);Wheelchair cushion (measurements PT)    Recommendations for Other Services       Precautions / Restrictions Precautions Precautions: Fall Restrictions Weight Bearing Restrictions: No      Mobility  Bed Mobility               General bed mobility comments: on side of bed when PT entered    Transfers                   General transfer comment: did not stand up    Ambulation/Gait                   Stairs             Wheelchair Mobility    Modified Rankin (Stroke Patients Only)       Balance                                            Cognition Arousal/Alertness: Awake/alert Behavior During Therapy: WFL for tasks assessed/performed Overall Cognitive Status: Impaired/Different from baseline Area of Impairment: Problem solving;Safety/judgement;Attention;Orientation                 Orientation Level: Time Current Attention Level: Selective Memory: Decreased recall of precautions;Decreased short-term memory Following Commands: Follows one step commands inconsistently;Follows one step commands with increased time Safety/Judgement: Decreased awareness of deficits Awareness: Intellectual Problem Solving: Slow processing;Requires verbal cues General Comments: reinforced need for walker assistance, and reviewed exercises with repetitive cues needed not to allow her heel to strike the bedrail on the relaxation of quad extension        Exercises General Exercises - Lower Extremity Ankle Circles/Pumps: AROM;5 reps  Quad Sets: AROM;10 reps Gluteal Sets: AROM;10 reps Long Arc Quad: AROM;10 reps Heel Slides: AROM;10 reps Hip ABduction/ADduction: AROM;10 reps    General Comments        Pertinent Vitals/Pain Pain Assessment: No/denies pain Breathing: normal Negative Vocalization: none Facial Expression: smiling or inexpressive Body Language: relaxed Consolability: no need to console PAINAD Score: 0    Home Living                          Prior Function            PT Goals (current goals can now be found in the care plan section) Acute Rehab PT Goals Patient Stated Goal: go home  soon    Frequency    Min 3X/week      PT Plan Current plan remains appropriate    Co-evaluation              AM-PAC PT "6 Clicks" Mobility   Outcome Measure  Help needed turning from your back to your side while in a flat bed without using bedrails?: A Little Help needed moving from lying on your back to sitting on the side of a flat bed without using bedrails?: A Little Help needed moving to and from a bed to a chair (including a wheelchair)?: A Little Help needed standing up from a chair using your arms (e.g., wheelchair or bedside chair)?: A Little Help needed to walk in hospital room?: A Little Help needed climbing 3-5 steps with a railing? : Total 6 Click Score: 16    End of Session   Activity Tolerance: Patient tolerated treatment well Patient left: in bed;with call bell/phone within reach;with bed alarm set Nurse Communication: Mobility status PT Visit Diagnosis: Unsteadiness on feet (R26.81);Other abnormalities of gait and mobility (R26.89);Muscle weakness (generalized) (M62.81);Difficulty in walking, not elsewhere classified (R26.2);Ataxic gait (R26.0)     Time: 1017-5102 PT Time Calculation (min) (ACUTE ONLY): 15 min  Charges:  $Therapeutic Exercise: 8-22 mins              Ramond Dial 03/26/2021, 4:02 PM  Mee Hives, PT PhD Acute Rehab Dept. Number: [place]

## 2021-03-27 NOTE — Discharge Summary (Signed)
Name: Stacey Wong MRN: [id number] DOB: [birth year] 40 y.o. PCP: Patient, No Pcp Per (Inactive)  Date of Admission: 03/16/2021 12:57 PM Date of Discharge: 03/26/2021  4:26 PM Attending Physician: No att. providers found  Discharge Diagnosis: 1. Complicated alcohol withdrawal with Delirium 2. Bipolar affective disorder 3. Thrombocytosis 4. Hyponatremia  Discharge Medications: Allergies as of 03/26/2021   No Known Allergies      Medication List     STOP taking these medications    potassium chloride SA 20 MEQ tablet Commonly known as: KLOR-CON M   Voltaren 1 % Gel Generic drug: diclofenac Sodium       TAKE these medications    CertaVite/Antioxidants Tabs Take 1 tablet by mouth daily.   folic acid 1 MG tablet Commonly known as: FOLVITE Take 1 tablet (1 mg total) by mouth daily.   nicotine 21 mg/24hr patch Commonly known as: NICODERM CQ - dosed in mg/24 hours Place 1 patch (21 mg total) onto the skin daily.   QUEtiapine 200 MG tablet Commonly known as: SEROQUEL Place 1 tablet (200 mg total) into feeding tube at bedtime.   QUEtiapine 50 MG tablet Commonly known as: SEROQUEL Take 1 tablet (50 mg total) by mouth daily.   thiamine 100 MG tablet Take 1 tablet (100 mg total) by mouth daily.               Discharge Care Instructions  (From admission, onward)           Start     Ordered   03/26/21 0000  Discharge wound care:       Comments: As per nursing instructions   03/26/21 1421            Disposition and follow-up:   Stacey Wong was discharged from [facility] in Stable condition.  At the hospital follow up visit please address:  1.  Counsel on abstinence from alcohol. Continue to monitor for development of cirrhosis.   2.  Labs / imaging needed at time of follow-up: NA  3.  Pending labs/ test needing follow-up: peripheral blood smear for thrombocytosis.   Follow-up Appointments:  Follow-up Information      [facility] Follow up.   Contact information: [address], [facility] Family Follow up.   Specialty: Family Medicine Why: patient to fill out sliding scale application before apt can be made Contact information: [place] [address] [phone]         [facility] Follow up.   Why: 3 n 1, rolling walker Contact information: [place] [phone]                 Hospital Course by problem list: 1. Complicated alcohol withdrawal with Delirium Patient presented with confusion, agitation, and hallucinations in the context of quitting alcohol 2 days prior. She received large amounts of Ativan in the ED without improvement. She required an ICU admission with Precedex drip for several days. She was then started on a phenobarbital taper. She was then sent to the floor for observation. She was transitioned to Librium, which was tapered to once daily dosing before D/C. On the day of discharge the patient was alert and oriented, able to engage in interview. PT/OT deemed her appropriate for returning home with home health PT. Patient was discharged with resources for substance use treatment.  2. Bipolar affective disorder Psychiatry was consulted and the patient was started on Seroquel 50 mg daily and 200 mg QHS. Reportedly the patient had been non-adherent with medicines for 4 years. The patient was discharged with information for outpatient psychiatric follow up.   3. Thrombocytosis Platelets of 745 on day of discharge. This developed during the course of the hospitalization. Uncertain etiology. Labs negative for iron deficiency. Peripheral smear pending at time of discharge.   4. Hyponatremia Thought to be secondary to hypovolemia. Improved with normal saline challenge.    Discharge Exam:   BP 137/84 (BP Location: Left  Arm)   Pulse (!) 102   Temp 98.6 F (37 C) (Oral)   Resp 17   Ht 5\' 4"  (1.626 m)   Wt 52.3 kg   SpO2 100%   BMI 19.79 kg/m  Discharge exam:  Physical Exam Vitals reviewed.  Cardiovascular:     Rate and Rhythm: Normal rate and regular rhythm.     Pulses: Normal pulses.     Heart sounds: No murmur heard. Pulmonary:     Effort: Pulmonary effort is normal.     Breath sounds: Normal breath sounds.  Abdominal:     General: Bowel sounds are normal.     Palpations: Abdomen is soft.     Tenderness: There is no abdominal tenderness.  Neurological:     Mental Status: She is alert.     Cranial Nerves: No cranial nerve deficit.     Comments: 5/5 strength in all extremities, sensation intact +dysdiadochokinesia and dysmetria, normal heel to shin testing Did not assess gait      Pertinent Labs, Studies, and Procedures:  Plts as above  Discharge Instructions: You were admitted to the hospital for severe alcohol withdrawal. You required an ICU stay for this problem. Taking your vitamins, folic acid, and thiamine will be important for your health going forward. You were also diagnosed with bipolar disorder. Taking your prescribed Seroquel will help manage this and prevent recurrence of this problem.   Please follow up with an outpatient psychiatrist for medication management and therapy. You can find potential providers in the list of resources given to you by the social worker and at the website below.  [email or web address]   Signed: Corky Sox, MD PGY-1 Pager: [phone] After 5pm on weekdays and 1pm on weekends: On Call pager: [phone]

## 2021-03-28 LAB — PATHOLOGIST SMEAR REVIEW

## 2021-04-16 ENCOUNTER — Telehealth: Payer: Self-pay | Admitting: *Deleted

## 2021-04-16 NOTE — Telephone Encounter (Signed)
Received call from pt's husband who stated pt was started on Seroquel and started c/o hands/feet numbness and feels like she standing on pins and needles. So after looking up side effects, they stopped the Seroquel 8 days ago. Stated pt slightly better but has difficulty grabbing things and drops things a lot. Pt was seen in the hospital; has not been here at the clinic. Pt was discharged from the hospital 03/26/21. Pt's husband wanting to know what to do next?

## 2021-05-02 ENCOUNTER — Encounter: Payer: Self-pay | Admitting: Internal Medicine

## 2021-05-02 ENCOUNTER — Other Ambulatory Visit: Payer: Self-pay

## 2021-05-02 ENCOUNTER — Other Ambulatory Visit (HOSPITAL_COMMUNITY): Payer: Self-pay

## 2021-05-02 ENCOUNTER — Ambulatory Visit (INDEPENDENT_AMBULATORY_CARE_PROVIDER_SITE_OTHER): Payer: Self-pay | Admitting: Internal Medicine

## 2021-05-02 VITALS — BP 129/98 | HR 105 | Temp 98.5°F | Resp 24 | Ht 64.0 in | Wt 114.8 lb

## 2021-05-02 DIAGNOSIS — D539 Nutritional anemia, unspecified: Secondary | ICD-10-CM

## 2021-05-02 DIAGNOSIS — F1011 Alcohol abuse, in remission: Secondary | ICD-10-CM

## 2021-05-02 DIAGNOSIS — G6289 Other specified polyneuropathies: Secondary | ICD-10-CM

## 2021-05-02 DIAGNOSIS — F3164 Bipolar disorder, current episode mixed, severe, with psychotic features: Secondary | ICD-10-CM

## 2021-05-02 DIAGNOSIS — G629 Polyneuropathy, unspecified: Secondary | ICD-10-CM

## 2021-05-02 MED ORDER — GABAPENTIN 100 MG PO CAPS
100.0000 mg | ORAL_CAPSULE | Freq: Three times a day (TID) | ORAL | 2 refills | Status: AC
  Filled 2021-05-02: qty 30, 10d supply, fill #0
  Filled 2021-05-12: qty 30, 10d supply, fill #1

## 2021-05-02 NOTE — Assessment & Plan Note (Addendum)
Patient presents for hospital follow-up following recent hospitalization from 11/18-11/28 for alcohol withdrawal requiring ICU stay.  Patient was not aware of the reason why she was hospitalized.  Discussed that this was due to heavy alcohol use followed by abrupt discontinuation of alcohol use causing alcohol withdrawal. Patient reports abstinence from alcohol since discharge and no further withdrawal symptoms.  Discussed importance of abstaining from alcohol given new polyneuropathy and concern for developing alcohol-related liver disease in the future.  Discussed importance of treating underlying mood symptoms of anxiety which encourage patients to self medicate with alcohol.  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

## 2021-05-02 NOTE — Patient Instructions (Signed)
Thank you, [name] for allowing [place] to provide your care today. Today we discussed:   Peripheral neuropathy: The pain in your hands and feet could be related to alcohol use or could be related to nutritional deficiency and folic acid and thiamine.  Please take the folic acid and thiamine supplements that were prescribed at discharge.  We will start you on a nerve pain medication called gabapentin, 100 mg 3 times daily.  I will prescribe a 1 month course and we will see you in follow-up in 1 month to see how you are doing on the medication.  I have ordered home health physical therapy, they will give you a call soon.  Alcohol use disorder: I am so glad to hear that you have been able to stay away from alcohol since you are discharged from the hospital.  It will be very important that you continue to do so.  Additionally we discussed connecting with a psychiatrist or psychologist here in [place].  At this time you would like to think about it, we will discuss again in 1 month when you see [place] for follow-up.  I have ordered the following labs for you:  My Chart Access: [email or web address]  Please follow-up in 1 month.  Please make sure to arrive 15 minutes prior to your next appointment. If you arrive late, you may be asked to reschedule.    We look forward to seeing you next time. Please call our clinic at [phone] if you have any questions or concerns. The best time to call is Monday-Friday from 9am-4pm, but there is someone available 24/7. If after hours or the weekend, call the main hospital number and ask for the Internal Medicine Resident On-Call. If you need medication refills, please notify your pharmacy one week in advance and they will send [place] a request.   Thank you for letting us take part in your care. Wishing you the best!  Wayland Denis, MD 05/02/2021, 2:39 PM IM Resident, PGY-1

## 2021-05-02 NOTE — Assessment & Plan Note (Signed)
Patient has macrocytic anemia with hemoglobin 9.9 and stable and MCV 117.  During hospitalization folic acid level was checked and was low at 1.8, normal B12.  Iron levels normal.  Patient was put on folic acid supplementation which was continued at discharge.  However patient reports not taking the supplement or her thiamine supplement.  Patient was encouraged to restart folic acid supplementation.  We will defer CBC today until next OV in 1 month. CBC would evaluate for change in hemoglobin, MCV, platelet count given recent thrombocytosis and hospitalization.  Plan: -Restart folic acid supplementation -CBC at next OV in 1 month

## 2021-05-02 NOTE — Assessment & Plan Note (Addendum)
Patient presents for hospital follow-up following recent hospitalization from 11/18-11/28 for alcohol withdrawal requiring ICU stay.  During hospitalization patient was noted to have folic acid deficiency and started on folic acid supplementation as well as thiamine supplementation.  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  On exam patient has difficulty differentiating pinprick stimuli on fingers and toes with normal sensation in her palms and plantar aspect of the feet, with sensation to light touch, strength and proprioception intact.  She reports pain has limited her ambulation. Patient has concerns that the symptoms started right after starting Seroquel, however this is an uncommon side effect from Seroquel.  More likely her polyneuropathy is a complication of her alcohol misuse and nutritional deficiencies.  Patient reports not taking folic acid or thiamine supplementation though is taking a multivitamin.  Patient was counseled that multivitamin may not contain the folic acid and thiamine needed and that deficiencies in these vitamins can cause neuropathy.  Patient voiced understanding and reports she will start taking the supplements that were prescribed at discharge.  She also reports improved p.o. intake and abstinence from alcohol.  Patient was counseled that many years of heavy alcohol use can cause polyneuropathy and that she will need to abstain from alcohol to prevent progression of disease. Patient was referred for home health PT services after discharge though has not heard from the home health company as of yet.  For neuropathic pain, patient would benefit from starting gabapentin.  Plan: -Gabapentin 100 mg 3 times daily -Alcohol abstinence -Continue folic acid and thiamine supplementation -Ambulatory referral to Home health physical therapy -Follow-up 1 month

## 2021-05-02 NOTE — Progress Notes (Signed)
°  CC: "Pain in hands and feet"  HPI:  [name] is a 41 y.o. female with a past medical history stated below and presents today for a hospital follow up appointment and new complaint of pain in her hands and feet. Please see problem based assessment and plan for additional details.  Past Medical History:  Diagnosis Date   Asthma    Bipolar 1 disorder ([place])    Hypertension     Current Outpatient Medications on File Prior to Visit  Medication Sig Dispense Refill   folic acid (FOLVITE) 1 MG tablet Take 1 tablet (1 mg total) by mouth daily. 30 tablet 1   Multiple Vitamins-Minerals (CERTAVITE/ANTIOXIDANTS) TABS Take 1 tablet by mouth daily. 30 tablet 1   nicotine (NICODERM CQ - DOSED IN MG/24 HOURS) 21 mg/24hr patch Place 1 patch (21 mg total) onto the skin daily. 28 patch 0   QUEtiapine (SEROQUEL) 200 MG tablet Place 1 tablet (200 mg total) into feeding tube at bedtime. 30 tablet 1   QUEtiapine (SEROQUEL) 50 MG tablet Take 1 tablet (50 mg total) by mouth daily. 30 tablet 1   thiamine 100 MG tablet Take 1 tablet (100 mg total) by mouth daily. 30 tablet 1   No current facility-administered medications on file prior to visit.   Family History  Problem Relation Age of Onset   Diabetes type II Father    Social History: Patient reports no further alcohol use since hospitalization.  Has cut down on cigarette use from 2 packs/day to 1 pack/day since hospitalization.  Patient has been intermittently ambulating with a walker at home.  She has pain that prevents her mobilization though completes all other ADLs per self.  Review of Systems: ROS negative except for what is noted on the assessment and plan.  Vitals:   05/02/21 1324 05/02/21 1337 05/02/21 1438  BP: (!) 132/102 (!) 143/127 (!) 129/98  Pulse: (!) 102 (!) 104 (!) 105  Resp: (!) 24    Temp: 98.5 F (36.9 C)    TempSrc: Oral    SpO2: 99%    Weight: 114 lb 12.8 oz (52.1 kg)    Height: 5\' 4"  (1.626 m)       Physical  Exam: General: Chronically ill appearing Caucasian female, underweight, NAD HENT: normocephalic, atraumatic, MMM EYES: conjunctiva non-erythematous, no scleral icterus CV: regular rate, normal rhythm, no murmurs, rubs, gallops. Pulmonary: normal work of breathing on RA, lungs clear to auscultation, no rales, wheezes, rhonchi Abdominal: non-distended, soft, non-tender to palpation, normal BS Skin: Warm and dry, no rashes or lesions Neurological: MS: awake, alert and oriented x3, normal speech and fund of knowledge Motor: 5/5 strength BUE and BLE Sensation: Decreased sensation to pinprick distal fingers and toes, sensation to pinprick intact bilateral palms and plantar aspects of the feet.  Sensation to light touch intact.  Proprioception intact. Psych: Teary-eyed, anxious affect    Assessment & Plan:   See Encounters Tab for problem based charting.  Patient discussed with Dr. Solon Augusta, M.D. [facility], PGY-1 Pager: [phone] Date 05/02/2021 Time 3:23 PM

## 2021-05-02 NOTE — Assessment & Plan Note (Addendum)
Patient was evaluated by [facility] health psychiatry during recent hospitalization for alcohol withdrawal.  She has a past diagnosis of bipolar disorder and has been prescribed mood stabilizers in the past.  Per patient and her significant other, endorsed recent manic episode prior to hospitalization, has been off psychiatric medications for ~4 years.  Patient was started on Seroquel during hospital stay for agitation and delirium.  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  However decision was made to keep patient on Seroquel at discharge. On exam today, no signs of mania, teary-eyed during discussion, reports anxiety about recent hospitalization and medical conditions moving forward.  Patient reports today that she discontinued the Seroquel after sudden onset of pain and tingling in her distal extremities.   Discussed with patient that this would be an unusual side effect of Seroquel and is more likely related to her alcohol misuse and/or nutritional deficiencies.  Patient currently reluctant to follow-up with psychiatry or a psychologist.  Discussed that we have Dr. Theodis Shove here at [facility] whom she could see regularly, patient will consider.  Patient currently uninsured, received paperwork today at the front desk to set up insurance.  Plan: -Obtain health insurance coverage -Patient will consider establishing care with Dr. Theodis Shove -Follow-up 1 month

## 2021-05-03 ENCOUNTER — Other Ambulatory Visit (HOSPITAL_COMMUNITY): Payer: Self-pay

## 2021-05-04 NOTE — Progress Notes (Signed)
Internal Medicine Clinic Attending  I saw and evaluated the patient.  I personally confirmed the key portions of the history and exam documented by Dr. Vinetta Bergamo and I reviewed pertinent patient test results.  The assessment, diagnosis, and plan were formulated together and I agree with the documentation in the residents note.  Blood smear obtained during hospitalization reviewed.  Evidence of macrocytic anemia and thrombocytosis.  Patient was hesitant to have further blood work today, will plan for repeat CBC at follow-up visit in 1 month to assess thrombocytosis as well as status of macrocytic anemia following folate supplementation.  Strongly encouraged patient to consider psychiatric services and/or behavioral health services within our clinic.  We will need to follow-up at next appointment.

## 2021-05-14 ENCOUNTER — Other Ambulatory Visit (HOSPITAL_COMMUNITY): Payer: Self-pay

## 2021-05-31 ENCOUNTER — Telehealth: Payer: Self-pay | Admitting: *Deleted

## 2021-05-31 ENCOUNTER — Encounter: Payer: Medicaid Other | Admitting: Student

## 2021-05-31 NOTE — Telephone Encounter (Signed)
Called patient regarding her missed 1:15 p.m. appointment. LVM to call the clinic at the [phone] number to reschedule appointment.

## 2022-09-22 ENCOUNTER — Encounter: Payer: Self-pay | Admitting: *Deleted
